# Patient Record
Sex: Female | Born: 1965
Health system: Southern US, Community
[De-identification: ages and names within clinical notes are randomized; demographics above are authoritative.]

## PROBLEM LIST (undated history)

## (undated) DIAGNOSIS — G43909 Migraine, unspecified, not intractable, without status migrainosus: Secondary | ICD-10-CM

## (undated) DIAGNOSIS — L732 Hidradenitis suppurativa: Secondary | ICD-10-CM

## (undated) DIAGNOSIS — Z720 Tobacco use: Secondary | ICD-10-CM

## (undated) DIAGNOSIS — C801 Malignant (primary) neoplasm, unspecified: Secondary | ICD-10-CM

## (undated) DIAGNOSIS — N951 Menopausal and female climacteric states: Secondary | ICD-10-CM

## (undated) DIAGNOSIS — F418 Other specified anxiety disorders: Secondary | ICD-10-CM

## (undated) DIAGNOSIS — G5600 Carpal tunnel syndrome, unspecified upper limb: Secondary | ICD-10-CM

## (undated) DIAGNOSIS — R5383 Other fatigue: Secondary | ICD-10-CM

## (undated) DIAGNOSIS — B019 Varicella without complication: Secondary | ICD-10-CM

## (undated) DIAGNOSIS — R8789 Other abnormal findings in specimens from female genital organs: Secondary | ICD-10-CM

## (undated) DIAGNOSIS — L309 Dermatitis, unspecified: Secondary | ICD-10-CM

## (undated) DIAGNOSIS — E785 Hyperlipidemia, unspecified: Secondary | ICD-10-CM

## (undated) DIAGNOSIS — T7840XA Allergy, unspecified, initial encounter: Secondary | ICD-10-CM

## (undated) DIAGNOSIS — B269 Mumps without complication: Secondary | ICD-10-CM

## (undated) DIAGNOSIS — E559 Vitamin D deficiency, unspecified: Secondary | ICD-10-CM

## (undated) DIAGNOSIS — B059 Measles without complication: Secondary | ICD-10-CM

## (undated) HISTORY — DX: Allergy, unspecified, initial encounter: T78.40XA

## (undated) HISTORY — DX: Migraine, unspecified, not intractable, without status migrainosus: G43.909

## (undated) HISTORY — DX: Varicella without complication: B01.9

## (undated) HISTORY — DX: Tobacco use: Z72.0

## (undated) HISTORY — DX: Dermatitis, unspecified: L30.9

## (undated) HISTORY — DX: Carpal tunnel syndrome, unspecified upper limb: G56.00

## (undated) HISTORY — DX: Other specified anxiety disorders: F41.8

## (undated) HISTORY — DX: Measles without complication: B05.9

## (undated) HISTORY — DX: Menopausal and female climacteric states: N95.1

## (undated) HISTORY — DX: Hyperlipidemia, unspecified: E78.5

## (undated) HISTORY — DX: Malignant (primary) neoplasm, unspecified: C80.1

## (undated) HISTORY — DX: Other abnormal findings in specimens from female genital organs: R87.89

## (undated) HISTORY — DX: Mumps without complication: B26.9

## (undated) HISTORY — DX: Vitamin D deficiency, unspecified: E55.9

## (undated) HISTORY — DX: Hidradenitis suppurativa: L73.2

## (undated) HISTORY — DX: Other fatigue: R53.83

---

## 2002-04-05 DIAGNOSIS — R8789 Other abnormal findings in specimens from female genital organs: Secondary | ICD-10-CM

## 2002-04-05 HISTORY — DX: Other abnormal findings in specimens from female genital organs: R87.89

## 2002-04-05 HISTORY — PX: LEEP: SHX91

## 2005-07-15 ENCOUNTER — Encounter: Admission: RE | Admit: 2005-07-15 | Discharge: 2005-07-15 | Payer: Self-pay | Admitting: Obstetrics and Gynecology

## 2005-08-09 ENCOUNTER — Encounter: Admission: RE | Admit: 2005-08-09 | Discharge: 2005-08-09 | Payer: Self-pay | Admitting: Obstetrics and Gynecology

## 2011-11-09 ENCOUNTER — Ambulatory Visit (INDEPENDENT_AMBULATORY_CARE_PROVIDER_SITE_OTHER): Payer: BC Managed Care – PPO | Admitting: Family Medicine

## 2011-11-09 ENCOUNTER — Encounter: Payer: Self-pay | Admitting: Family Medicine

## 2011-11-09 VITALS — BP 109/68 | HR 71 | Temp 97.5°F | Ht 63.75 in | Wt 135.8 lb

## 2011-11-09 DIAGNOSIS — Z0000 Encounter for general adult medical examination without abnormal findings: Secondary | ICD-10-CM

## 2011-11-09 DIAGNOSIS — G43909 Migraine, unspecified, not intractable, without status migrainosus: Secondary | ICD-10-CM | POA: Insufficient documentation

## 2011-11-09 DIAGNOSIS — F418 Other specified anxiety disorders: Secondary | ICD-10-CM | POA: Insufficient documentation

## 2011-11-09 DIAGNOSIS — R110 Nausea: Secondary | ICD-10-CM

## 2011-11-09 DIAGNOSIS — Z716 Tobacco abuse counseling: Secondary | ICD-10-CM | POA: Insufficient documentation

## 2011-11-09 DIAGNOSIS — Z23 Encounter for immunization: Secondary | ICD-10-CM

## 2011-11-09 DIAGNOSIS — E785 Hyperlipidemia, unspecified: Secondary | ICD-10-CM | POA: Insufficient documentation

## 2011-11-09 DIAGNOSIS — T7840XA Allergy, unspecified, initial encounter: Secondary | ICD-10-CM

## 2011-11-09 DIAGNOSIS — F419 Anxiety disorder, unspecified: Secondary | ICD-10-CM | POA: Insufficient documentation

## 2011-11-09 DIAGNOSIS — R490 Dysphonia: Secondary | ICD-10-CM

## 2011-11-09 DIAGNOSIS — F341 Dysthymic disorder: Secondary | ICD-10-CM

## 2011-11-09 DIAGNOSIS — R5383 Other fatigue: Secondary | ICD-10-CM

## 2011-11-09 DIAGNOSIS — L259 Unspecified contact dermatitis, unspecified cause: Secondary | ICD-10-CM

## 2011-11-09 DIAGNOSIS — E860 Dehydration: Secondary | ICD-10-CM

## 2011-11-09 DIAGNOSIS — G5600 Carpal tunnel syndrome, unspecified upper limb: Secondary | ICD-10-CM

## 2011-11-09 DIAGNOSIS — L309 Dermatitis, unspecified: Secondary | ICD-10-CM

## 2011-11-09 DIAGNOSIS — F17200 Nicotine dependence, unspecified, uncomplicated: Secondary | ICD-10-CM

## 2011-11-09 DIAGNOSIS — Z720 Tobacco use: Secondary | ICD-10-CM

## 2011-11-09 DIAGNOSIS — R5381 Other malaise: Secondary | ICD-10-CM

## 2011-11-09 HISTORY — DX: Dermatitis, unspecified: L30.9

## 2011-11-09 HISTORY — DX: Migraine, unspecified, not intractable, without status migrainosus: G43.909

## 2011-11-09 HISTORY — DX: Other fatigue: R53.83

## 2011-11-09 HISTORY — DX: Tobacco use: Z72.0

## 2011-11-09 MED ORDER — TETANUS-DIPHTH-ACELL PERTUSSIS 5-2.5-18.5 LF-MCG/0.5 IM SUSP: 0.5000 mL | Freq: Once | INTRAMUSCULAR | Status: DC

## 2011-11-09 MED ORDER — PROMETHAZINE HCL 25 MG PO TABS: 25.0000 mg | ORAL_TABLET | Freq: Three times a day (TID) | ORAL | Status: DC | PRN

## 2011-11-09 MED ORDER — BUTALBITAL-ACETAMINOPHEN 50-650 MG PO TABS: 1.0000 | ORAL_TABLET | Freq: Two times a day (BID) | ORAL | Status: DC | PRN

## 2011-11-09 MED ORDER — TRIAMCINOLONE ACETONIDE 0.1 % EX CREA: TOPICAL_CREAM | Freq: Two times a day (BID) | CUTANEOUS | Status: DC | PRN

## 2011-11-09 NOTE — Assessment & Plan Note (Addendum)
Dry mucus membranes with significant coffee intake (up to 10 cups a day) and little water intake, is encouraged to at least switch to 1/2 decaf and up her water and clear fluid intake to compensate

## 2011-11-09 NOTE — Assessment & Plan Note (Signed)
Check an FLP, request old records and start a Mega[employer] krill oil cap daily

## 2011-11-09 NOTE — Patient Instructions (Addendum)
Preventive Care for Adults, Female A healthy lifestyle and preventive care can promote health and wellness. Preventive health guidelines for women include the following key practices.  A routine yearly physical is a good way to check with your caregiver about your health and preventive screening. It is a chance to share any concerns and updates on your health, and to receive a thorough exam.   Visit your dentist for a routine exam and preventive care every 6 months. Brush your teeth twice a day and floss once a day. Good oral hygiene prevents tooth decay and gum disease.   The frequency of eye exams is based on your age, health, family medical history, use of contact lenses, and other factors. Follow your caregiver's recommendations for frequency of eye exams.   Eat a healthy diet. Foods like vegetables, fruits, whole grains, low-fat dairy products, and lean protein foods contain the nutrients you need without too many calories. Decrease your intake of foods high in solid fats, added sugars, and salt. Eat the right amount of calories for you.Get information about a proper diet from your caregiver, if necessary.   Regular physical exercise is one of the most important things you can do for your health. Most adults should get at least 150 minutes of moderate-intensity exercise (any activity that increases your heart rate and causes you to sweat) each week. In addition, most adults need muscle-strengthening exercises on 2 or more days a week.   Maintain a healthy weight. The body mass index (BMI) is a screening tool to identify possible weight problems. It provides an estimate of body fat based on height and weight. Your caregiver can help determine your BMI, and can help you achieve or maintain a healthy weight.For adults 20 years and older:   A BMI below 18.5 is considered underweight.   A BMI of 18.5 to 24.9 is normal.   A BMI of 25 to 29.9 is considered overweight.   A BMI of 30 and above is  considered obese.   Maintain normal blood lipids and cholesterol levels by exercising and minimizing your intake of saturated fat. Eat a balanced diet with plenty of fruit and vegetables. Blood tests for lipids and cholesterol should begin at age 20 and be repeated every 5 years. If your lipid or cholesterol levels are high, you are over 50, or you are at high risk for heart disease, you may need your cholesterol levels checked more frequently.Ongoing high lipid and cholesterol levels should be treated with medicines if diet and exercise are not effective.   If you smoke, find out from your caregiver how to quit. If you do not use tobacco, do not start.   If you are pregnant, do not drink alcohol. If you are breastfeeding, be very cautious about drinking alcohol. If you are not pregnant and choose to drink alcohol, do not exceed 1 drink per day. One drink is considered to be 12 ounces (355 mL) of beer, 5 ounces (148 mL) of wine, or 1.5 ounces (44 mL) of liquor.   Avoid use of street drugs. Do not share needles with anyone. Ask for help if you need support or instructions about stopping the use of drugs.   High blood pressure causes heart disease and increases the risk of stroke. Your blood pressure should be checked at least every 1 to 2 years. Ongoing high blood pressure should be treated with medicines if weight loss and exercise are not effective.   If you are 55 to 48   years old, ask your caregiver if you should take aspirin to prevent strokes.   Diabetes screening involves taking a blood sample to check your fasting blood sugar level. This should be done once every 3 years, after age 45, if you are within normal weight and without risk factors for diabetes. Testing should be considered at a younger age or be carried out more frequently if you are overweight and have at least 1 risk factor for diabetes.   Breast cancer screening is essential preventive care for women. You should practice "breast  self-awareness." This means understanding the normal appearance and feel of your breasts and may include breast self-examination. Any changes detected, no matter how small, should be reported to a caregiver. Women in their 20s and 30s should have a clinical breast exam (CBE) by a caregiver as part of a regular health exam every 1 to 3 years. After age 40, women should have a CBE every year. Starting at age 40, women should consider having a mammography (breast X-ray test) every year. Women who have a family history of breast cancer should talk to their caregiver about genetic screening. Women at a high risk of breast cancer should talk to their caregivers about having magnetic resonance imaging (MRI) and a mammography every year.   The Pap test is a screening test for cervical cancer. A Pap test can show cell changes on the cervix that might become cervical cancer if left untreated. A Pap test is a procedure in which cells are obtained and examined from the lower end of the uterus (cervix).   Women should have a Pap test starting at age 21.   Between ages 21 and 29, Pap tests should be repeated every 2 years.   Beginning at age 30, you should have a Pap test every 3 years as long as the past 3 Pap tests have been normal.   Some women have medical problems that increase the chance of getting cervical cancer. Talk to your caregiver about these problems. It is especially important to talk to your caregiver if a new problem develops soon after your last Pap test. In these cases, your caregiver may recommend more frequent screening and Pap tests.   The above recommendations are the same for women who have or have not gotten the vaccine for human papillomavirus (HPV).   If you had a hysterectomy for a problem that was not cancer or a condition that could lead to cancer, then you no longer need Pap tests. Even if you no longer need a Pap test, a regular exam is a good idea to make sure no other problems are  starting.   If you are between ages 65 and 70, and you have had normal Pap tests going back 10 years, you no longer need Pap tests. Even if you no longer need a Pap test, a regular exam is a good idea to make sure no other problems are starting.   If you have had past treatment for cervical cancer or a condition that could lead to cancer, you need Pap tests and screening for cancer for at least 20 years after your treatment.   If Pap tests have been discontinued, risk factors (such as a new sexual partner) need to be reassessed to determine if screening should be resumed.   The HPV test is an additional test that may be used for cervical cancer screening. The HPV test looks for the virus that can cause the cell changes on the cervix.   The cells collected during the Pap test can be tested for HPV. The HPV test could be used to screen women aged 30 years and older, and should be used in women of any age who have unclear Pap test results. After the age of 30, women should have HPV testing at the same frequency as a Pap test.   Colorectal cancer can be detected and often prevented. Most routine colorectal cancer screening begins at the age of 50 and continues through age 75. However, your caregiver may recommend screening at an earlier age if you have risk factors for colon cancer. On a yearly basis, your caregiver may provide home test kits to check for hidden blood in the stool. Use of a small camera at the end of a tube, to directly examine the colon (sigmoidoscopy or colonoscopy), can detect the earliest forms of colorectal cancer. Talk to your caregiver about this at age 50, when routine screening begins. Direct examination of the colon should be repeated every 5 to 10 years through age 75, unless early forms of pre-cancerous polyps or small growths are found.   Hepatitis C blood testing is recommended for all people born from 1945 through 1965 and any individual with known risks for hepatitis C.    Practice safe sex. Use condoms and avoid high-risk sexual practices to reduce the spread of sexually transmitted infections (STIs). STIs include gonorrhea, chlamydia, syphilis, trichomonas, herpes, HPV, and human immunodeficiency virus (HIV). Herpes, HIV, and HPV are viral illnesses that have no cure. They can result in disability, cancer, and death. Sexually active women aged 25 and younger should be checked for chlamydia. Older women with new or multiple partners should also be tested for chlamydia. Testing for other STIs is recommended if you are sexually active and at increased risk.   Osteoporosis is a disease in which the bones lose minerals and strength with aging. This can result in serious bone fractures. The risk of osteoporosis can be identified using a bone density scan. Women ages 65 and over and women at risk for fractures or osteoporosis should discuss screening with their caregivers. Ask your caregiver whether you should take a calcium supplement or vitamin D to reduce the rate of osteoporosis.   Menopause can be associated with physical symptoms and risks. Hormone replacement therapy is available to decrease symptoms and risks. You should talk to your caregiver about whether hormone replacement therapy is right for you.   Use sunscreen with sun protection factor (SPF) of 30 or more. Apply sunscreen liberally and repeatedly throughout the day. You should seek shade when your shadow is shorter than you. Protect yourself by wearing long sleeves, pants, a wide-brimmed hat, and sunglasses year round, whenever you are outdoors.   Once a month, do a whole body skin exam, using a mirror to look at the skin on your back. Notify your caregiver of new moles, moles that have irregular borders, moles that are larger than a pencil eraser, or moles that have changed in shape or color.   Stay current with required immunizations.   Influenza. You need a dose every fall (or winter). The composition of  the flu vaccine changes each year, so being vaccinated once is not enough.   Pneumococcal polysaccharide. You need 1 to 2 doses if you smoke cigarettes or if you have certain chronic medical conditions. You need 1 dose at age 65 (or older) if you have never been vaccinated.   Tetanus, diphtheria, pertussis (Tdap, Td). Get 1 dose of   Tdap vaccine if you are younger than age 65, are over 65 and have contact with an infant, are a healthcare worker, are pregnant, or simply want to be protected from whooping cough. After that, you need a Td booster dose every 10 years. Consult your caregiver if you have not had at least 3 tetanus and diphtheria-containing shots sometime in your life or have a deep or dirty wound.   HPV. You need this vaccine if you are a woman age 26 or younger. The vaccine is given in 3 doses over 6 months.   Measles, mumps, rubella (MMR). You need at least 1 dose of MMR if you were born in 1957 or later. You may also need a second dose.   Meningococcal. If you are age 19 to 21 and a first-year college student living in a residence hall, or have one of several medical conditions, you need to get vaccinated against meningococcal disease. You may also need additional booster doses.   Zoster (shingles). If you are age 60 or older, you should get this vaccine.   Varicella (chickenpox). If you have never had chickenpox or you were vaccinated but received only 1 dose, talk to your caregiver to find out if you need this vaccine.   Hepatitis A. You need this vaccine if you have a specific risk factor for hepatitis A virus infection or you simply wish to be protected from this disease. The vaccine is usually given as 2 doses, 6 to 18 months apart.   Hepatitis B. You need this vaccine if you have a specific risk factor for hepatitis B virus infection or you simply wish to be protected from this disease. The vaccine is given in 3 doses, usually over 6 months.  Preventive Services /  Frequency Ages 19 to 39  Blood pressure check.** / Every 1 to 2 years.   Lipid and cholesterol check.** / Every 5 years beginning at age 20.   Clinical breast exam.** / Every 3 years for women in their 20s and 30s.   Pap test.** / Every 2 years from ages 21 through 29. Every 3 years starting at age 30 through age 65 or 70 with a history of 3 consecutive normal Pap tests.   HPV screening.** / Every 3 years from ages 30 through ages 65 to 70 with a history of 3 consecutive normal Pap tests.   Hepatitis C blood test.** / For any individual with known risks for hepatitis C.   Skin self-exam. / Monthly.   Influenza immunization.** / Every year.   Pneumococcal polysaccharide immunization.** / 1 to 2 doses if you smoke cigarettes or if you have certain chronic medical conditions.   Tetanus, diphtheria, pertussis (Tdap, Td) immunization. / A one-time dose of Tdap vaccine. After that, you need a Td booster dose every 10 years.   HPV immunization. / 3 doses over 6 months, if you are 26 and younger.   Measles, mumps, rubella (MMR) immunization. / You need at least 1 dose of MMR if you were born in 1957 or later. You may also need a second dose.   Meningococcal immunization. / 1 dose if you are age 19 to 21 and a first-year college student living in a residence hall, or have one of several medical conditions, you need to get vaccinated against meningococcal disease. You may also need additional booster doses.   Varicella immunization.** / Consult your caregiver.   Hepatitis A immunization.** / Consult your caregiver. 2 doses, 6 to 18 months   apart.   Hepatitis B immunization.** / Consult your caregiver. 3 doses usually over 6 months.  Ages 40 to 64  Blood pressure check.** / Every 1 to 2 years.   Lipid and cholesterol check.** / Every 5 years beginning at age 20.   Clinical breast exam.** / Every year after age 40.   Mammogram.** / Every year beginning at age 40 and continuing for as  long as you are in good health. Consult with your caregiver.   Pap test.** / Every 3 years starting at age 30 through age 65 or 70 with a history of 3 consecutive normal Pap tests.   HPV screening.** / Every 3 years from ages 30 through ages 65 to 70 with a history of 3 consecutive normal Pap tests.   Fecal occult blood test (FOBT) of stool. / Every year beginning at age 50 and continuing until age 75. You may not need to do this test if you get a colonoscopy every 10 years.   Flexible sigmoidoscopy or colonoscopy.** / Every 5 years for a flexible sigmoidoscopy or every 10 years for a colonoscopy beginning at age 50 and continuing until age 75.   Hepatitis C blood test.** / For all people born from 1945 through 1965 and any individual with known risks for hepatitis C.   Skin self-exam. / Monthly.   Influenza immunization.** / Every year.   Pneumococcal polysaccharide immunization.** / 1 to 2 doses if you smoke cigarettes or if you have certain chronic medical conditions.   Tetanus, diphtheria, pertussis (Tdap, Td) immunization.** / A one-time dose of Tdap vaccine. After that, you need a Td booster dose every 10 years.   Measles, mumps, rubella (MMR) immunization. / You need at least 1 dose of MMR if you were born in 1957 or later. You may also need a second dose.   Varicella immunization.** / Consult your caregiver.   Meningococcal immunization.** / Consult your caregiver.   Hepatitis A immunization.** / Consult your caregiver. 2 doses, 6 to 18 months apart.   Hepatitis B immunization.** / Consult your caregiver. 3 doses, usually over 6 months.  Ages 65 and over  Blood pressure check.** / Every 1 to 2 years.   Lipid and cholesterol check.** / Every 5 years beginning at age 20.   Clinical breast exam.** / Every year after age 40.   Mammogram.** / Every year beginning at age 40 and continuing for as long as you are in good health. Consult with your caregiver.   Pap test.** /  Every 3 years starting at age 30 through age 65 or 70 with a 3 consecutive normal Pap tests. Testing can be stopped between 65 and 70 with 3 consecutive normal Pap tests and no abnormal Pap or HPV tests in the past 10 years.   HPV screening.** / Every 3 years from ages 30 through ages 65 or 70 with a history of 3 consecutive normal Pap tests. Testing can be stopped between 65 and 70 with 3 consecutive normal Pap tests and no abnormal Pap or HPV tests in the past 10 years.   Fecal occult blood test (FOBT) of stool. / Every year beginning at age 50 and continuing until age 75. You may not need to do this test if you get a colonoscopy every 10 years.   Flexible sigmoidoscopy or colonoscopy.** / Every 5 years for a flexible sigmoidoscopy or every 10 years for a colonoscopy beginning at age 50 and continuing until age 75.   Hepatitis   C blood test.** / For all people born from [birth year] through 1965 and any individual with known risks for hepatitis C.   Osteoporosis screening.** / A one-time screening for women ages 71 and over and women at risk for fractures or osteoporosis.   Skin self-exam. / Monthly.   Influenza immunization.** / Every year.   Pneumococcal polysaccharide immunization.** / 1 dose at age 56 (or older) if you have never been vaccinated.   Tetanus, diphtheria, pertussis (Tdap, Td) immunization. / A one-time dose of Tdap vaccine if you are over 65 and have contact with an infant, are a Research scientist (physical sciences), or simply want to be protected from whooping cough. After that, you need a Td booster dose every 10 years.   Varicella immunization.** / Consult your caregiver.   Meningococcal immunization.** / Consult your caregiver.   Hepatitis A immunization.** / Consult your caregiver. 2 doses, 6 to 18 months apart.   Hepatitis B immunization.** / Check with your caregiver. 3 doses, usually over 6 months.  ** Family history and personal history of risk and conditions may change your caregiver's  recommendations. Document Released: 05/18/2001 Document Revised: 03/11/2011 Document Reviewed: 08/17/2010 [facility] Patient Information 2012 [place], [place].  Migraine Headache A migraine headache is an intense, throbbing pain on one or both sides of your head. The exact cause of a migraine headache is not always known. A migraine may be caused when nerves in the brain become irritated and release chemicals that cause swelling within blood vessels, causing pain. Many migraine sufferers have a family history of migraines. Before you get a migraine you may or may not get an aura. An aura is a group of symptoms that can predict the beginning of a migraine. An aura may include:  Visual changes such as:   Flashing lights.   Bright spots or zig-zag lines.   Tunnel vision.   Feelings of numbness.   Trouble talking.   Muscle weakness.  SYMPTOMS  Pain on one or both sides of your head.   Pain that is pulsating or throbbing in nature.   Pain that is severe enough to prevent daily activities.   Pain that is aggravated by any daily physical activity.   Nausea (feeling sick to your stomach), vomiting, or both.   Pain with exposure to bright lights, loud noises, or activity.   General sensitivity to bright lights or loud noises.  MIGRAINE TRIGGERS Examples of triggers of migraine headaches include:   Alcohol.   Smoking.   Stress.   It may be related to menses (female menstruation).   Aged cheeses.   Foods or drinks that contain nitrates, glutamate, aspartame, or tyramine.   Lack of sleep.   Chocolate.   Caffeine.   Hunger.   Medications such as nitroglycerine (used to treat chest pain), birth control pills, estrogen, and some blood pressure medications.  DIAGNOSIS  A migraine headache is often diagnosed based on:  Symptoms.   Physical examination.   A computerized X-ray scan (computed tomography, CT) of your head.  TREATMENT  Medications can help prevent  migraines if they are recurrent or should they become recurrent. Your caregiver can help you with a medication or treatment program that will be helpful to you.   Lying down in a dark, quiet room may be helpful.   Keeping a headache diary may help you find a trend as to what may be triggering your headaches.  SEEK IMMEDIATE MEDICAL CARE IF:   You have confusion, personality changes or seizures.  You have headaches that wake you from sleep.   You have an increased frequency in your headaches.   You have a stiff neck.   You have a loss of vision.   You have muscle weakness.   You start losing your balance or have trouble walking.   You feel faint or pass out.  MAKE SURE YOU:   Understand these instructions.   Will watch your condition.   Will get help right away if you are not doing well or get worse.  Document Released: 03/22/2005 Document Revised: 03/11/2011 Document Reviewed: 11/05/2008 [facility] Patient Information 2012 [place], [place].  So initially for HA take Ibuprofen 200 mg tabs 2 of them or an Excedrine Migraine. Then if nausea develops or you just feel it is a bad HA and you can afford to go to sleep take the Promethazine/Phenergan and lie down. The final med is Bupap and it can be taken alone or in combination with any other meds for any type of HA. Most people have no sedation with the Bupap

## 2011-11-09 NOTE — Assessment & Plan Note (Signed)
Takes zyrtec daily and denies any significant congestion or sneezing but does note the recent development of hoarseness, without cough, dysphagia or illness. Likely multifactorial, encouraged better hydration, stop smoking and if hoarseness persists will need to see ENT

## 2011-11-09 NOTE — Assessment & Plan Note (Signed)
Has been getting migraines intermittently for for several years now. Are decreasing in frequency and are likely related to perimenopause. She uses ibuprofen alternately with Excedrin Migraine he gets some Aleve although with a bad headache will be in bed all day. Has never taken prescription medications. Is given a small amount of Bupap and promethazine. Is instructed to start with ibuprofen or Excedrin Migraine if no relief and nausea develops try the promethazine as long she can afford to go to sleep. If nothing is helpful or she cannot afford to go to sleep she may try the Bupap. Instructed regarding the need for adequate hydration and small frequent meals as well

## 2011-11-09 NOTE — Assessment & Plan Note (Addendum)
Has failed conservative management with Voltaren gel, splinting. Recent EMG studies with her neurologist Dr Loleta Chance shows persistent deficits, she is being referred to orthopaedics for surgical correction. Has had symptoms in the past on the left but these did respond to conservative management

## 2011-11-09 NOTE — Progress Notes (Signed)
Patient ID: Michelle Arnold, female   DOB: Aug 27, 1965, 46 y.o.   MRN: [id number] Michelle Arnold [id number] 14-Feb-1966 11/09/2011      Progress Note New Patient  Subjective  Chief Complaint  Chief Complaint  Patient presents with  . Establish Care    new patient    HPI  This is a 46 year old Caucasian female who is in today to establish care. She, acute past medical history but is doing relatively well at this time. She has a long history of depression anxiety has been on and off medications for years but reports her current dose of Effexor is working relatively well. 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 She also has 2 grown children and 2 stepchildren at home and so is under a great deal of stress. Also has 2 other stepchildren no longer living. She is a smoker who continues to smoke a pack a day and realizes she needs to quit but has difficulty getting ready to do so. Has failed Wellbutrin in the past. She follows with Dr. Loleta Chance of neurology and has recently been told that her right hand carpal tunnel syndrome requires surgery. She notes she has a recent history of hoarseness developing maybe the last month or so. No dysphasia, cough, recent illness or noted. She has a history of allergies and takes Zyrtec every day but has not had recent congestion or sneezing. She's had migraine headaches off and on for years presently she is getting only a couple months and they're decreasing in frequency she's never taken a prescription medication for this. Ibuprofen and Excedrin Migraine help somewhat. The headaches occasionally get out of the cause nausea vomiting and she has trouble keeping medications down. She has a rash on her right wrist at about a month it is itchy and raised secondary to use of Voltaren gel for her carpal tunnel syndrome. She's not had any medications thus far to try and manage it she is to stop the Voltaren gel.  Past Medical  History  Diagnosis Date  . Chicken pox as a child  . Measles as a child  . Mumps as a child  . Hyperlipidemia   . Depression   . Anxiety   . CTS (carpal tunnel syndrome)     right  . Allergy   . Migraine 11/09/2011  . Tobacco abuse 11/09/2011  . Fatigue 11/09/2011  . Preventative health care 11/09/2011  . Dehydration 11/09/2011  . Depression with anxiety   . Dermatitis 11/09/2011    Right arm  . Hoarseness 11/09/2011    Past Surgical History  Procedure Date  . Leep 2004    Family History  Problem Relation Age of Onset  . Diabetes Mother     ?  Marland Kitchen Osteoporosis Mother   . Hyperlipidemia Father   . Heart disease Father     MI in 56  . Hypertension Father   . Heart attack Father 40  . Cancer Maternal Grandmother     lung- smoker  . Osteoporosis Maternal Grandmother   . Cancer Maternal Grandfather     prostate  . Cataracts Paternal Grandmother   . Other Paternal Grandfather     PAD with gangrene    History   Social History  . Marital Status: Married    Spouse Name: N/A    Number of Children: N/A  . Years of Education: N/A   Occupational History  . Not on  file.   Social History Main Topics  . Smoking status: Current Everyday Smoker -- 1.0 packs/day for 30 years    Types: Cigarettes  . Smokeless tobacco: Never Used  . Alcohol Use: Yes     very rare  . Drug Use: No  . Sexually Active: Yes -- Female partner(s)   Other Topics Concern  . Not on file   Social History Narrative  . No narrative on file    Current Outpatient Prescriptions on File Prior to Visit  Medication Sig Dispense Refill  . Calcium Citrate (CITRACAL PO) Take 2 tablets by mouth daily.      . cetirizine (ZYRTEC) 10 MG tablet Take 10 mg by mouth daily.      . promethazine (PHENERGAN) 25 MG tablet Take 1 tablet (25 mg total) by mouth every 8 (eight) hours as needed for nausea (causes sedation).  20 tablet  0  . rosuvastatin (CRESTOR) 5 MG tablet Take 5 mg by mouth daily.      Marland Kitchen venlafaxine (EFFEXOR)  75 MG tablet Take 75 mg by mouth daily.       No current facility-administered medications on file prior to visit.    No Known Allergies  Review of Systems  Review of Systems  Constitutional: Positive for malaise/fatigue. Negative for fever and chills.  HENT: Negative for hearing loss, nosebleeds and congestion.   Eyes: Negative for discharge.  Respiratory: Negative for cough, sputum production, shortness of breath and wheezing.   Cardiovascular: Negative for chest pain, palpitations and leg swelling.  Gastrointestinal: Negative for heartburn, nausea, vomiting, abdominal pain, diarrhea, constipation and blood in stool.  Genitourinary: Negative for dysuria, urgency, frequency and hematuria.  Musculoskeletal: Negative for myalgias, back pain and falls.  Skin: Positive for itching and rash.       Right wrist x 1 months  Neurological: Negative for dizziness, tremors, sensory change, focal weakness, loss of consciousness, weakness and headaches.  Endo/Heme/Allergies: Negative for polydipsia. Does not bruise/bleed easily.  Psychiatric/Behavioral: Negative for depression and suicidal ideas. The patient is nervous/anxious. The patient does not have insomnia.     Objective  BP 109/68  Pulse 71  Temp 97.5 F (36.4 C) (Temporal)  Ht 5' 3.75" (1.619 m)  Wt 135 lb 12.8 oz (61.598 kg)  BMI 23.49 kg/m2  SpO2 94%  Physical Exam  Physical Exam  Constitutional: She is oriented to person, place, and time and well-developed, well-nourished, and in no distress. No distress.  HENT:  Head: Normocephalic and atraumatic.  Right Ear: External ear normal.  Left Ear: External ear normal.  Nose: Nose normal.  Mouth/Throat: Oropharynx is clear and moist. No oropharyngeal exudate.  Eyes: Conjunctivae are normal. Pupils are equal, round, and reactive to light. Right eye exhibits no discharge. Left eye exhibits no discharge. No scleral icterus.  Neck: Normal range of motion. Neck supple. No thyromegaly  present.  Cardiovascular: Normal rate, regular rhythm, normal heart sounds and intact distal pulses.   No murmur heard. Pulmonary/Chest: Effort normal. No respiratory distress. She has no wheezes. She has no rales.       Prolonged expiratory phase  Abdominal: Soft. Bowel sounds are normal. She exhibits no distension and no mass. There is no tenderness.  Musculoskeletal: Normal range of motion. She exhibits no edema and no tenderness.  Lymphadenopathy:    She has no cervical adenopathy.  Neurological: She is alert and oriented to person, place, and time. She has normal reflexes. No cranial nerve deficit. Coordination normal.  Skin: Skin is  warm and dry. Rash noted. She is not diaphoretic. There is erythema.       Right wrist erythematous maculopapular lesions noted  Psychiatric: Mood, memory and affect normal.       Assessment & Plan  Preventative health care Given Tdap today. Old records requested from previous PMD. Encouraged DASH diet and increase exercise.  Migraine Has been getting migraines intermittently for for several years now. Are decreasing in frequency and are likely related to perimenopause. She uses ibuprofen alternately with Excedrin Migraine he gets some Aleve although with a bad headache will be in bed all day. Has never taken prescription medications. Is given a small amount of Bupap and promethazine. Is instructed to start with ibuprofen or Excedrin Migraine if no relief and nausea develops try the promethazine as long she can afford to go to sleep. If nothing is helpful or she cannot afford to go to sleep she may try the Bupap. Instructed regarding the need for adequate hydration and small frequent meals as well  CTS (carpal tunnel syndrome) Has failed conservative management with Voltaren gel, splinting. Recent EMG studies with her neurologist Dr Loleta Chance shows persistent deficits, she is being referred to orthopaedics for surgical correction. Has had symptoms in the past  on the left but these did respond to conservative management  Hyperlipidemia Check an FLP, request old records and start a MegaRed krill oil cap daily   Allergic state Takes zyrtec daily and denies any significant congestion or sneezing but does note the recent development of hoarseness, without cough, dysphagia or illness. Likely multifactorial, encouraged better hydration, stop smoking and if hoarseness persists will need to see ENT  Hoarseness Takes zyrtec daily and denies any significant congestion or sneezing but does note the recent development of hoarseness, without cough, dysphagia or illness. Likely multifactorial, encouraged better hydration, stop smoking and if hoarseness persists will need to see ENT  Fatigue Multifactorial, recommend labs, quit smoking and due to poor concentration as well would consider low dose Ritalin as a therapy if needed  Dermatitis Secondary to Voltaren, will hold the gel and start her on Triamcinolone bid as needed  Dehydration Dry mucus membranes with significant coffee intake (up to 10 cups a day) and little water intake, is encouraged to at least switch to 1/2 decaf and up her water and clear fluid intake to compensate  Tobacco abuse Smoking a ppd, educated at length regarding the need to quit, she does have an ecig. Is encouraged to wait 10 to 15 minutes every time she has a craving and then to use the ecig instead of a cig. Reassess at next visit  Depression with anxiety Is stable on Effexor 75mg , has tried to come off in past and does not do well. She will maintain this dose for now

## 2011-11-09 NOTE — Assessment & Plan Note (Signed)
Secondary to Voltaren, will hold the gel and start her on Triamcinolone bid as needed

## 2011-11-09 NOTE — Assessment & Plan Note (Signed)
Given Tdap today. Old records requested from previous PMD. Encouraged DASH diet and increase exercise.

## 2011-11-09 NOTE — Assessment & Plan Note (Signed)
Multifactorial, recommend labs, quit smoking and due to poor concentration as well would consider low dose Ritalin as a therapy if needed

## 2011-11-09 NOTE — Assessment & Plan Note (Signed)
Takes zyrtec daily and denies any significant congestion or sneezing but does note the recent development of hoarseness, without cough, dysphagia or illness. Likely multifactorial, encouraged better hydration, stop smoking and if hoarseness persists will need to see ENT 

## 2011-11-09 NOTE — Assessment & Plan Note (Signed)
Smoking a ppd, educated at length regarding the need to quit, she does have an ecig. Is encouraged to wait 10 to 15 minutes every time she has a craving and then to use the ecig instead of a cig. Reassess at next visit

## 2011-11-09 NOTE — Assessment & Plan Note (Addendum)
Is stable on Effexor 75mg , has tried to come off in past and does not do well. She will maintain this dose for now

## 2011-12-02 ENCOUNTER — Other Ambulatory Visit: Payer: Self-pay | Admitting: Family Medicine

## 2011-12-02 ENCOUNTER — Telehealth: Payer: Self-pay | Admitting: Family Medicine

## 2011-12-02 DIAGNOSIS — G5600 Carpal tunnel syndrome, unspecified upper limb: Secondary | ICD-10-CM

## 2011-12-02 NOTE — Telephone Encounter (Signed)
So I was under the impression that the neurologist was handling the referral since they had done the EMG and work up. I am happy to help with the referral now that I know she wants me to do it but I do not have the results of the EMG so she will have to get them and take them to the neurologist

## 2011-12-02 NOTE — Telephone Encounter (Signed)
LM for patient to call me back. I was able to get a chart started at the [facility] [place]. They said we can fax [phone] attn: Wilkie Aye the referral and the EMG studies from Dr [facility] office when we receive them. No appt can be made until they receive the EMG studies. I have faxed the medical record release form to Dr [facility] office.

## 2011-12-02 NOTE — Telephone Encounter (Signed)
So she actually knew we were not doing the referral her neurologist was supposed to do it and did not I can facilitate this and we will try and get the EMG studies through a records release. If we R unsuccessful she will have to go up there and request the study

## 2011-12-02 NOTE — Telephone Encounter (Signed)
Patient states she has been waiting for a referral to an orthopedist since her last OV (11/09/11) with Dr Loleta Chance who is her neurologist. She has contacted [facility] [place] [phone] & they state they have not received a referral. Patient would like to know if we can set up her referral for her hand. She states the carpal tunnel symptoms are waking her up at night. She also needs to take care of this since her coworker will be out later in the year & she needs to keep the office covered.

## 2011-12-10 ENCOUNTER — Other Ambulatory Visit (INDEPENDENT_AMBULATORY_CARE_PROVIDER_SITE_OTHER): Payer: BC Managed Care – PPO

## 2011-12-10 DIAGNOSIS — Z79899 Other long term (current) drug therapy: Secondary | ICD-10-CM

## 2011-12-10 DIAGNOSIS — Z0000 Encounter for general adult medical examination without abnormal findings: Secondary | ICD-10-CM

## 2011-12-10 DIAGNOSIS — R5383 Other fatigue: Secondary | ICD-10-CM

## 2011-12-10 LAB — CBC
Hemoglobin: 13 g/dL (ref 12.0–15.0)
RDW: 13.4 % (ref 11.5–14.6)

## 2011-12-10 LAB — RENAL FUNCTION PANEL
BUN: 17 mg/dL (ref 6–23)
Chloride: 104 mEq/L (ref 96–112)
Creatinine, Ser: 0.6 mg/dL (ref 0.4–1.2)
GFR: 115.53 mL/min (ref >60.00)
Glucose, Bld: 96 mg/dL (ref 70–99)
Potassium: 5.1 mEq/L (ref 3.5–5.1)
Sodium: 140 mEq/L (ref 135–145)

## 2011-12-10 LAB — TSH: TSH: 0.31 u[IU]/mL — ABNORMAL LOW (ref 0.35–5.50)

## 2011-12-10 LAB — HEPATIC FUNCTION PANEL
AST: 25 U/L (ref 0–37)
Alkaline Phosphatase: 58 U/L (ref 39–117)
Bilirubin, Direct: 0 mg/dL (ref 0.0–0.3)
Total Bilirubin: 0.5 mg/dL (ref 0.3–1.2)

## 2011-12-10 LAB — LIPID PANEL
Cholesterol: 208 mg/dL — ABNORMAL HIGH (ref 0–200)
Total CHOL/HDL Ratio: 3
VLDL: 21.6 mg/dL (ref 0.0–40.0)

## 2011-12-16 ENCOUNTER — Encounter: Payer: Self-pay | Admitting: Family Medicine

## 2011-12-16 ENCOUNTER — Ambulatory Visit (INDEPENDENT_AMBULATORY_CARE_PROVIDER_SITE_OTHER): Payer: BC Managed Care – PPO | Admitting: Family Medicine

## 2011-12-16 VITALS — BP 114/74 | HR 89 | Temp 98.0°F | Ht 63.75 in | Wt 133.8 lb

## 2011-12-16 DIAGNOSIS — F32A Depression, unspecified: Secondary | ICD-10-CM

## 2011-12-16 DIAGNOSIS — E785 Hyperlipidemia, unspecified: Secondary | ICD-10-CM

## 2011-12-16 DIAGNOSIS — F329 Major depressive disorder, single episode, unspecified: Secondary | ICD-10-CM

## 2011-12-16 DIAGNOSIS — Z720 Tobacco use: Secondary | ICD-10-CM

## 2011-12-16 DIAGNOSIS — L732 Hidradenitis suppurativa: Secondary | ICD-10-CM | POA: Insufficient documentation

## 2011-12-16 DIAGNOSIS — F341 Dysthymic disorder: Secondary | ICD-10-CM

## 2011-12-16 DIAGNOSIS — T7840XA Allergy, unspecified, initial encounter: Secondary | ICD-10-CM

## 2011-12-16 DIAGNOSIS — L738 Other specified follicular disorders: Secondary | ICD-10-CM

## 2011-12-16 DIAGNOSIS — F17200 Nicotine dependence, unspecified, uncomplicated: Secondary | ICD-10-CM

## 2011-12-16 DIAGNOSIS — E079 Disorder of thyroid, unspecified: Secondary | ICD-10-CM

## 2011-12-16 DIAGNOSIS — L739 Follicular disorder, unspecified: Secondary | ICD-10-CM

## 2011-12-16 HISTORY — DX: Hidradenitis suppurativa: L73.2

## 2011-12-16 LAB — RENAL FUNCTION PANEL
Albumin: 4.6 g/dL (ref 3.5–5.2)
CO2: 28 mEq/L (ref 19–32)
Calcium: 10.3 mg/dL (ref 8.4–10.5)
Chloride: 102 mEq/L (ref 96–112)
Glucose, Bld: 93 mg/dL (ref 70–99)
Phosphorus: 4.7 mg/dL — ABNORMAL HIGH (ref 2.3–4.6)
Potassium: 5.3 mEq/L — ABNORMAL HIGH (ref 3.5–5.1)
Sodium: 138 mEq/L (ref 135–145)

## 2011-12-16 MED ORDER — BUPROPION HCL ER (XL) 300 MG PO TB24: 300.0000 mg | ORAL_TABLET | Freq: Every day | ORAL | Status: DC

## 2011-12-16 MED ORDER — ALIGN PO CAPS: 1.0000 | ORAL_CAPSULE | Freq: Every day | ORAL | Status: AC

## 2011-12-16 MED ORDER — BUPROPION HCL ER (XL) 150 MG PO TB24: 150.0000 mg | ORAL_TABLET | Freq: Every day | ORAL | Status: DC

## 2011-12-16 MED ORDER — SULFAMETHOXAZOLE-TRIMETHOPRIM 800-160 MG PO TABS: 1.0000 | ORAL_TABLET | Freq: Two times a day (BID) | ORAL | Status: AC

## 2011-12-16 MED ORDER — MUPIROCIN 2 % EX OINT: TOPICAL_OINTMENT | Freq: Every day | CUTANEOUS | Status: AC

## 2011-12-16 NOTE — Progress Notes (Signed)
Patient ID: Michelle Arnold, female   DOB: Aug 31, 1965, 46 y.o.   MRN: [id number] Michelle Arnold [id number] 02/28/1966 12/16/2011      Progress Note-Follow Up  Subjective  Chief Complaint  Chief Complaint  Patient presents with  . Follow-up     1 month    HPI  Patient is a 46 year old Caucasian female in today for followup. She is struggling with increased congestion. She feels her allergies are clear. She's got cough is productive of gray phlegm as well as some postnasal drip. She's complaining of a follicular lesions in her groin which been present for a while. She's been drained and mechanically. She applied hot compresses frequently and they're very uncomfortable. No fevers or chills. She continues to smoke. Is down about half pack per day with her EDC red. She underwent menopause with her last period at roughly age 46 but is noting some episodes of heat intolerance and hot flashes or she becomes slightly sweaty. She does acknowledge this often occurs when she is needing 4 while. No chest pain, palpitations, shortness of breath, GI or GU complaints noted.  Past Medical History  Diagnosis Date  . Chicken pox as a child  . Measles as a child  . Mumps as a child  . Hyperlipidemia   . Depression   . Anxiety   . CTS (carpal tunnel syndrome)     right  . Allergy   . Migraine 11/09/2011  . Tobacco abuse 11/09/2011  . Fatigue 11/09/2011  . Preventative health care 11/09/2011  . Dehydration 11/09/2011  . Depression with anxiety   . Dermatitis 11/09/2011    Right arm  . Hoarseness 11/09/2011  . Other abnormal Papanicolaou smear of cervix and cervical HPV 2004    required LEEP procedure in 04 but no recurrence  . Folliculitis 12/16/2011    Past Surgical History  Procedure Date  . Leep 2004    Family History  Problem Relation Age of Onset  . Diabetes Mother     ?  Marland Kitchen Osteoporosis Mother   . Hyperlipidemia Father   . Heart disease Father     MI in 30  . Hypertension Father   . Heart attack  Father 80  . Cancer Maternal Grandmother     lung- smoker  . Osteoporosis Maternal Grandmother   . Cancer Maternal Grandfather     prostate  . Cataracts Paternal Grandmother   . Other Paternal Grandfather     PAD with gangrene    History   Social History  . Marital Status: Married    Spouse Name: N/A    Number of Children: N/A  . Years of Education: N/A   Occupational History  . Not on file.   Social History Main Topics  . Smoking status: Current Every Day Smoker -- 1.0 packs/day for 30 years    Types: Cigarettes  . Smokeless tobacco: Never Used  . Alcohol Use: Yes     very rare  . Drug Use: No  . Sexually Active: Yes -- Female partner(s)   Other Topics Concern  . Not on file   Social History Narrative  . No narrative on file    Current Outpatient Prescriptions on File Prior to Visit  Medication Sig Dispense Refill  . B Complex-C (SUPER B COMPLEX/VITAMIN C) TABS Take 1 tablet by mouth daily.      . Calcium Citrate (CITRACAL PO) Take 2 tablets by mouth daily.      . cetirizine (ZYRTEC) 10 MG tablet  Take 10 mg by mouth daily.      . Cholecalciferol (VITAMIN D3) 5000 UNITS CAPS Take 1 capsule by mouth daily.      . Cyanocobalamin (B-12) 2500 MCG SUBL Place 1 tablet under the tongue daily.      Marland Kitchen FOLIC ACID PO Take by mouth daily.      . Multiple Vitamin (MULTIVITAMIN) tablet Take 1 tablet by mouth daily.      . rosuvastatin (CRESTOR) 5 MG tablet Take 5 mg by mouth daily.      Marland Kitchen venlafaxine (EFFEXOR) 75 MG tablet Take 75 mg by mouth daily.      . ACETAMINOPHEN-BUTALBITAL (BUPAP) 50-650 MG TABS Take 1 tablet by mouth 2 (two) times daily as needed. Headache, tension, sinus or migraine  60 each  0  . buPROPion (WELLBUTRIN XL) 300 MG 24 hr tablet Take 1 tablet (300 mg total) by mouth daily. Start after 1 week of the 150 mg  30 tablet  3    No Known Allergies  Review of Systems  Review of Systems  Constitutional: Negative for fever and malaise/fatigue.  HENT: Negative  for congestion.   Eyes: Negative for discharge.  Respiratory: Negative for shortness of breath.   Cardiovascular: Negative for chest pain, palpitations and leg swelling.  Gastrointestinal: Negative for nausea, abdominal pain and diarrhea.  Genitourinary: Negative for dysuria.  Musculoskeletal: Negative for falls.  Skin: Positive for rash.  Neurological: Negative for loss of consciousness and headaches.  Endo/Heme/Allergies: Negative for polydipsia.  Psychiatric/Behavioral: Negative for depression and suicidal ideas. The patient is not nervous/anxious and does not have insomnia.     Objective  BP 114/74  Pulse 89  Temp 98 F (36.7 C) (Temporal)  Ht 5' 3.75" (1.619 m)  Wt 133 lb 12.8 oz (60.691 kg)  BMI 23.15 kg/m2  SpO2 95%  Physical Exam  Physical Exam  Constitutional: She is oriented to person, place, and time and well-developed, well-nourished, and in no distress. No distress.  HENT:  Head: Normocephalic and atraumatic.  Eyes: Conjunctivae normal are normal.  Neck: Neck supple. No thyromegaly present.  Cardiovascular: Normal rate, regular rhythm and normal heart sounds.   No murmur heard. Pulmonary/Chest: Effort normal and breath sounds normal. She has no wheezes.  Abdominal: She exhibits no distension and no mass.  Musculoskeletal: She exhibits no edema.  Lymphadenopathy:    She has no cervical adenopathy.  Neurological: She is alert and oriented to person, place, and time.  Skin: Skin is warm and dry. No rash noted. She is not diaphoretic.  Psychiatric: Memory, affect and judgment normal.    Lab Results  Component Value Date   TSH 0.36 12/16/2011   Lab Results  Component Value Date   WBC 10.3 12/10/2011   HGB 13.0 12/10/2011   HCT 39.3 12/10/2011   MCV 91.1 12/10/2011   PLT 280.0 12/10/2011   Lab Results  Component Value Date   CREATININE 0.8 12/16/2011   BUN 12 12/16/2011   NA 138 12/16/2011   K 5.3* 12/16/2011   CL 102 12/16/2011   CO2 28 12/16/2011   Lab Results   Component Value Date   ALT 24 12/10/2011   AST 25 12/10/2011   ALKPHOS 58 12/10/2011   BILITOT 0.5 12/10/2011   Lab Results  Component Value Date   CHOL 208* 12/10/2011   Lab Results  Component Value Date   HDL 61.10 12/10/2011   No results found for this basename: [facility]   Lab Results  Component Value  Date   TRIG 108.0 12/10/2011   Lab Results  Component Value Date   CHOLHDL 3 12/10/2011     Assessment & Plan  Allergic state Increased  gray phlegm with PND, continue antihistamines and may increase to bid  Tobacco abuse We will add Wellbutrin to Effexor and she may use ecig as well.  Hyperlipidemia Numbers improved on low dose of Crestor, add MegaRed caps daily.. Avoid trans fats  Folliculitis Wound in right groin cultured, significant scar tissue is noted, started on Bactrim bid and Bacitracin to nares qhs and call if no improvement

## 2011-12-16 NOTE — Patient Instructions (Addendum)
Folliculitis  °Folliculitis is an infection and inflammation of the hair follicles. Hair follicles become red and irritated. This inflammation is usually caused by bacteria. The bacteria thrive in warm, moist environments. This condition can be seen anywhere on the body.  °CAUSES °The most common cause of folliculitis is an infection by germs (bacteria). Fungal and viral infections can also cause the condition. Viral infections may be more common in people whose bodies are unable to fight disease well (weakened immune systems). Examples include people with: °· AIDS.  °· An organ transplant.  °· Cancer.  °People with depressed immune systems, diabetes, or obesity, have a greater risk of getting folliculitis than the general population. Certain chemicals, especially oils and tars, also can cause folliculitis. °SYMPTOMS °· An early sign of folliculitis is a small, white or yellow pus-filled, itchy lesion (pustule). These lesions appear on a red, inflamed follicle. They are usually less than 5 mm (.20 inches).  °· The most likely starting points are the scalp, thighs, legs, back and buttocks. Folliculitis is also frequently found in areas of repeated shaving.  °· When an infection of the follicle goes deeper, it becomes a boil or furuncle. A group of closely packed boils create a larger lesion (a carbuncle). These sores (lesions) tend to occur in hairy, sweaty areas of the body.  °TREATMENT  °· A doctor who specializes in skin problems (dermatologists) treats mild cases of folliculitis with antiseptic washes.  °· They also use a skin application which kills germs (topical antibiotics). Tea tree oil is a good topical antiseptic as well. It can be found at a health food store. A small percentage of individuals may develop an allergy to the tea tree oil.  °· Mild to moderate boils respond well to warm water compresses applied three times daily.  °· In some cases, oral antibiotics should be taken with the skin treatment.    °· If lesions contain large quantities of pus or fluid, your caregiver may drain them. This allows the topical antibiotics to get to the affected areas better.  °· Stubborn cases of folliculitis may respond to laser hair removal. This process uses a high intensity light beam (a laser) to destroy the follicle and reduces the scarring from folliculitis. After laser hair removal, hair will no longer grow in the laser treated area.  °Patients with long-lasting folliculitis need to find out where the infection is coming from. Germs can live in the nostrils of the patient. This can trigger an outbreak now and then. Sometimes the bacteria live in the nostrils of a family member. This person does not develop the disorder but they repeatedly re-expose others to the germ. To break the cycle of recurrence in the patient, the family member must also undergo treatment. °PREVENTION  °· Individuals who are predisposed to folliculitis should be extremely careful about personal hygiene.  °· Application of antiseptic washes may help prevent recurrences.  °· A topical antibiotic cream, mupirocin (Bactroban®), has been effective at reducing bacteria in the nostrils. It is applied inside the nose with your little finger. This is done twice daily for a week. Then it is repeated every 6 months.  °· Because follicle disorders tend to come back, patients must receive follow-up care. Your caregiver may be able to recognize a recurrence before it becomes severe.  °SEEK IMMEDIATE MEDICAL CARE IF:  °· You develop redness, swelling, or increasing pain in the area.  °· You have a fever.  °· You are not improving with treatment   or are getting worse.  °· You have any other questions or concerns.  °Document Released: 05/31/2001 Document Revised: 03/11/2011 Document Reviewed: 03/27/2008 °Exit[employer]® Patient Information ©2012 [employer]. °

## 2011-12-16 NOTE — Assessment & Plan Note (Signed)
We will add Wellbutrin to Effexor and she may use ecig as well.

## 2011-12-16 NOTE — Assessment & Plan Note (Addendum)
Increased  gray phlegm with PND, continue antihistamines and may increase to bid

## 2011-12-16 NOTE — Assessment & Plan Note (Signed)
Wound in right groin cultured, significant scar tissue is noted, started on Bactrim bid and Bacitracin to nares qhs and call if no improvement

## 2011-12-16 NOTE — Assessment & Plan Note (Signed)
Numbers improved on low dose of Crestor, add MegaRed caps daily.. Avoid trans fats

## 2011-12-17 LAB — VITAMIN D 1,25 DIHYDROXY
Vitamin D 1, 25 (OH)2 Total: 54 pg/mL (ref 18–72)
Vitamin D3 1, 25 (OH)2: 54 pg/mL

## 2011-12-20 LAB — WOUND CULTURE: Gram Stain: NONE SEEN

## 2011-12-20 NOTE — Progress Notes (Signed)
Quick Note:  Patient Informed and voiced understanding ______ 

## 2011-12-23 NOTE — Telephone Encounter (Signed)
Patient has an appt scheduled at [facility] 01/10/12

## 2012-01-07 ENCOUNTER — Other Ambulatory Visit: Payer: Self-pay

## 2012-01-07 NOTE — Telephone Encounter (Signed)
Pt left a message stating she needed medications refilled. I left a message for patients mother to have pt return my call and if I don't answer leave what medications on my voicemail

## 2012-01-11 MED ORDER — ROSUVASTATIN CALCIUM 5 MG PO TABS: 5.0000 mg | ORAL_TABLET | Freq: Every day | ORAL | Status: DC

## 2012-01-11 MED ORDER — VENLAFAXINE HCL 75 MG PO TABS: 75.0000 mg | ORAL_TABLET | Freq: Every day | ORAL | Status: DC

## 2012-01-11 NOTE — Telephone Encounter (Signed)
Patient called, the 2 medications are Venlafaxine and crestor to [facility] in [facility]. Please call patient at [phone] when Rx has been sent to pharmacy.

## 2012-01-11 NOTE — Telephone Encounter (Signed)
Pt informed

## 2012-01-28 ENCOUNTER — Ambulatory Visit: Payer: BC Managed Care – PPO | Admitting: Family Medicine

## 2012-02-04 ENCOUNTER — Ambulatory Visit (INDEPENDENT_AMBULATORY_CARE_PROVIDER_SITE_OTHER): Payer: BC Managed Care – PPO | Admitting: Family Medicine

## 2012-02-04 ENCOUNTER — Encounter: Payer: Self-pay | Admitting: Family Medicine

## 2012-02-04 VITALS — BP 101/67 | HR 85 | Temp 98.4°F | Ht 63.75 in | Wt 135.8 lb

## 2012-02-04 DIAGNOSIS — F341 Dysthymic disorder: Secondary | ICD-10-CM

## 2012-02-04 DIAGNOSIS — L732 Hidradenitis suppurativa: Secondary | ICD-10-CM

## 2012-02-04 DIAGNOSIS — L738 Other specified follicular disorders: Secondary | ICD-10-CM

## 2012-02-04 DIAGNOSIS — F17200 Nicotine dependence, unspecified, uncomplicated: Secondary | ICD-10-CM

## 2012-02-04 DIAGNOSIS — Z23 Encounter for immunization: Secondary | ICD-10-CM

## 2012-02-04 DIAGNOSIS — F418 Other specified anxiety disorders: Secondary | ICD-10-CM

## 2012-02-04 DIAGNOSIS — L739 Follicular disorder, unspecified: Secondary | ICD-10-CM

## 2012-02-04 DIAGNOSIS — Z720 Tobacco use: Secondary | ICD-10-CM

## 2012-02-04 MED ORDER — CHLORHEXIDINE GLUCONATE 4 % EX LIQD: 60.0000 mL | Freq: Every day | CUTANEOUS | Status: DC | PRN

## 2012-02-04 MED ORDER — CEPHALEXIN 500 MG PO CAPS: 500.0000 mg | ORAL_CAPSULE | Freq: Four times a day (QID) | ORAL | Status: DC

## 2012-02-04 NOTE — Assessment & Plan Note (Signed)
Most notable in right groin, responded to Bactrim but has flared again since stopping it. Will try Keflex 500 mg 4 times daily. She is given Hibiclens to wash. Continue mupirocin ointment in the nose. Report if no improvement. Start a probiotic

## 2012-02-04 NOTE — Progress Notes (Signed)
Patient ID: Michelle Arnold, female   DOB: 12/08/1965, 46 y.o.   MRN: [id number] [name] [id number] 12-10-1965 02/04/2012      Progress Note-Follow Up  Subjective  Chief Complaint  Chief Complaint  Patient presents with  . Follow-up    6 week    HPI  Patient is a 46 year old Caucasian female who is in today for followup. At her last visit we gave her antibiotics for some skin infections and she said she felt much better while taking it. She said she was much less tired and fatigued and the lesions all improved greatly. Recently the lesions have recurred again and are worse in the right groin. Tender. She denies fevers, chills, malaise or myalgias of any significance so far. Unfortunately she continues to smoke routinely. No chest pain, application, shortness of breath, GI or GU complaints noted.  Past Medical History  Diagnosis Date  . Chicken pox as a child  . Measles as a child  . Mumps as a child  . Hyperlipidemia   . Depression   . Anxiety   . CTS (carpal tunnel syndrome)     right  . Allergy   . Migraine 11/09/2011  . Tobacco abuse 11/09/2011  . Fatigue 11/09/2011  . Preventative health care 11/09/2011  . Dehydration 11/09/2011  . Depression with anxiety   . Dermatitis 11/09/2011    Right arm  . Hoarseness 11/09/2011  . Other abnormal Papanicolaou smear of cervix and cervical HPV(795.09) 2004    required LEEP procedure in 04 but no recurrence  . Folliculitis 12/16/2011    Past Surgical History  Procedure Date  . Leep 2004    Family History  Problem Relation Age of Onset  . Diabetes Mother     ?  Marland Kitchen Osteoporosis Mother   . Hyperlipidemia Father   . Heart disease Father     MI in 9  . Hypertension Father   . Heart attack Father 2  . Cancer Maternal Grandmother     lung- smoker  . Osteoporosis Maternal Grandmother   . Cancer Maternal Grandfather     prostate  . Cataracts Paternal Grandmother   . Other Paternal Grandfather     PAD with gangrene    History    Social History  . Marital Status: Married    Spouse Name: N/A    Number of Children: N/A  . Years of Education: N/A   Occupational History  . Not on file.   Social History Main Topics  . Smoking status: Current Every Day Smoker -- 1.0 packs/day for 30 years    Types: Cigarettes  . Smokeless tobacco: Never Used  . Alcohol Use: Yes     very rare  . Drug Use: No  . Sexually Active: Yes -- Female partner(s)   Other Topics Concern  . Not on file   Social History Narrative  . No narrative on file    Current Outpatient Prescriptions on File Prior to Visit  Medication Sig Dispense Refill  . B Complex-C (SUPER B COMPLEX/VITAMIN C) TABS Take 1 tablet by mouth daily.      . bifidobacterium infantis (ALIGN) capsule Take 1 capsule by mouth daily.      . Calcium Citrate (CITRACAL PO) Take 2 tablets by mouth daily.      . cetirizine (ZYRTEC) 10 MG tablet Take 10 mg by mouth daily.      . Cholecalciferol (VITAMIN D3) 5000 UNITS CAPS Take 1 capsule by mouth daily.      Marland Kitchen  Cyanocobalamin (B-12) 2500 MCG SUBL Place 1 tablet under the tongue daily.      Marland Kitchen FOLIC ACID PO Take by mouth daily.      . Multiple Vitamin (MULTIVITAMIN) tablet Take 1 tablet by mouth daily.      . rosuvastatin (CRESTOR) 5 MG tablet Take 1 tablet (5 mg total) by mouth daily.  30 tablet  3  . venlafaxine (EFFEXOR) 75 MG tablet Take 1 tablet (75 mg total) by mouth daily.  30 tablet  3    No Known Allergies  Review of Systems  Review of Systems  Constitutional: Negative for fever and malaise/fatigue.  HENT: Negative for congestion.   Eyes: Negative for discharge.  Respiratory: Negative for shortness of breath.   Cardiovascular: Negative for chest pain, palpitations and leg swelling.  Gastrointestinal: Negative for nausea, abdominal pain and diarrhea.  Genitourinary: Negative for dysuria.  Musculoskeletal: Negative for falls.  Skin: Positive for rash.  Neurological: Negative for loss of consciousness and  headaches.  Endo/Heme/Allergies: Negative for polydipsia.  Psychiatric/Behavioral: Negative for depression and suicidal ideas. The patient is not nervous/anxious and does not have insomnia.     Objective  BP 101/67  Pulse 85  Temp 98.4 F (36.9 C) (Temporal)  Ht 5' 3.75" (1.619 m)  Wt 135 lb 12.8 oz (61.598 kg)  BMI 23.49 kg/m2  SpO2 97%  Physical Exam  Physical Exam  Constitutional: She is oriented to person, place, and time and well-developed, well-nourished, and in no distress. No distress.  HENT:  Head: Normocephalic and atraumatic.  Eyes: Conjunctivae normal are normal.  Neck: Neck supple. No thyromegaly present.  Cardiovascular: Normal rate, regular rhythm and normal heart sounds.   No murmur heard. Pulmonary/Chest: Effort normal and breath sounds normal. She has no wheezes.  Abdominal: She exhibits no distension and no mass.  Musculoskeletal: She exhibits no edema.  Lymphadenopathy:    She has no cervical adenopathy.  Neurological: She is alert and oriented to person, place, and time.  Skin: Skin is warm and dry. Rash noted. She is not diaphoretic. There is erythema.       Follicular lesion, mildly tender in right groin with central head with slight pus under.   Psychiatric: Memory, affect and judgment normal.    Lab Results  Component Value Date   TSH 0.36 12/16/2011   Lab Results  Component Value Date   WBC 10.3 12/10/2011   HGB 13.0 12/10/2011   HCT 39.3 12/10/2011   MCV 91.1 12/10/2011   PLT 280.0 12/10/2011   Lab Results  Component Value Date   CREATININE 0.8 12/16/2011   BUN 12 12/16/2011   NA 138 12/16/2011   K 5.3* 12/16/2011   CL 102 12/16/2011   CO2 28 12/16/2011   Lab Results  Component Value Date   ALT 24 12/10/2011   AST 25 12/10/2011   ALKPHOS 58 12/10/2011   BILITOT 0.5 12/10/2011   Lab Results  Component Value Date   CHOL 208* 12/10/2011   Lab Results  Component Value Date   HDL 61.10 12/10/2011   No results found for this basename: LDLCALC   Lab  Results  Component Value Date   TRIG 108.0 12/10/2011   Lab Results  Component Value Date   CHOLHDL 3 12/10/2011     Assessment & Plan  Depression with anxiety Took wellbutrin for 2 days for no reason, did not have any trouble with it. May try again.   Tobacco abuse Unfortunately continues to smoke encouraged to attempt  cessation again  Folliculitis Most notable in right groin, responded to Bactrim but has flared again since stopping it. Will try Keflex 500 mg 4 times daily. She is given Hibiclens to wash. Continue mupirocin ointment in the nose. Report if no improvement. Start a probiotic

## 2012-02-04 NOTE — Assessment & Plan Note (Signed)
Unfortunately continues to smoke encouraged to attempt cessation again

## 2012-02-04 NOTE — Patient Instructions (Addendum)
Hidradenitis Suppurativa, Sweat Gland Abscess  Hidradenitis suppurativa is a long lasting (chronic), uncommon disease of the sweat glands. With this, boil-like lumps and scarring develop in the groin, some times under the arms (axillae), and under the breasts. It may also uncommonly occur behind the ears, in the crease of the buttocks, and around the genitals.   CAUSES   The cause is from a blocking of the sweat glands. They then become infected. It may cause drainage and odor. It is not contagious. So it cannot be given to someone else. It most often shows up in puberty (about 10 to 48 years of age). But it may happen much later. It is similar to acne which is a disease of the sweat glands. This condition is slightly more common in African-Americans and women.  SYMPTOMS    Hidradenitis usually starts as one or more red, tender, swellings in the groin or under the arms (axilla).   Over a period of hours to days the lesions get larger. They often open to the skin surface, draining clear to yellow-colored fluid.   The infected area heals with scarring.  DIAGNOSIS   Your caregiver makes this diagnosis by looking at you. Sometimes cultures (growing germs on plates in the lab) may be taken. This is to see what germ (bacterium) is causing the infection.   TREATMENT    Topical germ killing medicine applied to the skin (antibiotics) are the treatment of choice. Antibiotics taken by mouth (systemic) are sometimes needed when the condition is getting worse or is severe.   Avoid tight-fitting clothing which traps moisture in.   Dirt does not cause hidradenitis and it is not caused by poor hygiene.   Involved areas should be cleaned daily using an antibacterial soap. Some patients find that the liquid form of Lever 2000, applied to the involved areas as a lotion after bathing, can help reduce the odor related to this condition.   Sometimes surgery is needed to drain infected areas or remove scarred tissue. Removal of  large amounts of tissue is used only in severe cases.   Birth control pills may be helpful.   Oral retinoids (vitamin A derivatives) for 6 to 12 months which are effective for acne may also help this condition.   Weight loss will improve but not cure hidradenitis. It is made worse by being overweight. But the condition is not caused by being overweight.   This condition is more common in people who have had acne.   It may become worse under stress.  There is no medical cure for hidradenitis. It can be controlled, but not cured. The condition usually continues for years with periods of getting worse and getting better (remission).  Document Released: 11/04/2003 Document Revised: 06/14/2011 Document Reviewed: 11/20/2007  [employer] Patient Information 2013 [employer].

## 2012-02-04 NOTE — Assessment & Plan Note (Signed)
Took wellbutrin for 2 days for no reason, did not have any trouble with it. May try again.

## 2012-04-11 ENCOUNTER — Ambulatory Visit: Payer: BC Managed Care – PPO | Admitting: Family Medicine

## 2012-04-18 ENCOUNTER — Ambulatory Visit: Payer: BC Managed Care – PPO | Admitting: Family Medicine

## 2012-04-19 ENCOUNTER — Ambulatory Visit: Payer: BC Managed Care – PPO | Admitting: Family Medicine

## 2012-04-19 ENCOUNTER — Ambulatory Visit (INDEPENDENT_AMBULATORY_CARE_PROVIDER_SITE_OTHER): Payer: BC Managed Care – PPO | Admitting: Family Medicine

## 2012-04-19 ENCOUNTER — Encounter: Payer: Self-pay | Admitting: Family Medicine

## 2012-04-19 VITALS — BP 112/75 | HR 75 | Temp 99.3°F | Ht 63.75 in | Wt 133.1 lb

## 2012-04-19 DIAGNOSIS — F17200 Nicotine dependence, unspecified, uncomplicated: Secondary | ICD-10-CM

## 2012-04-19 DIAGNOSIS — L732 Hidradenitis suppurativa: Secondary | ICD-10-CM

## 2012-04-19 DIAGNOSIS — E785 Hyperlipidemia, unspecified: Secondary | ICD-10-CM

## 2012-04-19 DIAGNOSIS — Z720 Tobacco use: Secondary | ICD-10-CM

## 2012-04-19 MED ORDER — AMOXICILLIN-POT CLAVULANATE 875-125 MG PO TABS: 1.0000 | ORAL_TABLET | Freq: Two times a day (BID) | ORAL | Status: DC

## 2012-04-19 NOTE — Assessment & Plan Note (Signed)
Recurrent in various site in groin. Encouraged bath with 1 tbls of bleach. Add  Back probiotics. Start Bactrim and use Hiibicleans.

## 2012-04-19 NOTE — Progress Notes (Signed)
Patient ID: Michelle Arnold, female   DOB: 10-Mar-1966, 47 y.o.   MRN: [id number] Michelle Arnold [id number] 21-Aug-1965 04/19/2012      Progress Note-Follow Up  Subjective  Chief Complaint  Chief Complaint  Patient presents with  . Follow-up    2 month  . boil    painful    HPI  Patient is a 47 year old Caucasian female who is in today for followup. She has had a recurrence of her hidradenitis. The left inguinal region is worse in the right she has lesions bilaterally. Does have some fatigue but denies fevers, chills, myalgias otherwise.  Has been able to person place and some lesions in. Did find her topical wash somewhat helpful at her last visit and they did not recur for a while. No recent illness. No chest pain, palpitations, shortness of breath, GI or GU complaints. She does still continue to worry about her daughter endorses been under a great deal of stress but feels she is handling it relatively well  Past Medical History  Diagnosis Date  . Chicken pox as a child  . Measles as a child  . Mumps as a child  . Hyperlipidemia   . Depression   . Anxiety   . CTS (carpal tunnel syndrome)     right  . Allergy   . Migraine 11/09/2011  . Tobacco abuse 11/09/2011  . Fatigue 11/09/2011  . Preventative health care 11/09/2011  . Dehydration 11/09/2011  . Depression with anxiety   . Dermatitis 11/09/2011    Right arm  . Hoarseness 11/09/2011  . Other abnormal Papanicolaou smear of cervix and cervical HPV(795.09) 2004    required LEEP procedure in 04 but no recurrence  . Folliculitis 12/16/2011  . Hidradenitis suppurativa 12/16/2011    Past Surgical History  Procedure Date  . Leep 2004    Family History  Problem Relation Age of Onset  . Diabetes Mother     ?  Marland Kitchen Osteoporosis Mother   . Hyperlipidemia Father   . Heart disease Father     MI in 26  . Hypertension Father   . Heart attack Father 86  . Cancer Maternal Grandmother     lung- smoker  . Osteoporosis Maternal Grandmother   .  Cancer Maternal Grandfather     prostate  . Cataracts Paternal Grandmother   . Other Paternal Grandfather     PAD with gangrene    History   Social History  . Marital Status: Married    Spouse Name: N/A    Number of Children: N/A  . Years of Education: N/A   Occupational History  . Not on file.   Social History Main Topics  . Smoking status: Current Every Day Smoker -- 1.0 packs/day for 30 years    Types: Cigarettes  . Smokeless tobacco: Never Used  . Alcohol Use: Yes     Comment: very rare  . Drug Use: No  . Sexually Active: Yes -- Female partner(s)   Other Topics Concern  . Not on file   Social History Narrative  . No narrative on file    Current Outpatient Prescriptions on File Prior to Visit  Medication Sig Dispense Refill  . B Complex-C (SUPER B COMPLEX/VITAMIN C) TABS Take 1 tablet by mouth daily.      . bifidobacterium infantis (ALIGN) capsule Take 1 capsule by mouth daily.      . cetirizine (ZYRTEC) 10 MG tablet Take 10 mg by mouth daily.      Marland Kitchen  Cholecalciferol (VITAMIN D3) 5000 UNITS CAPS Take 1 capsule by mouth daily.      . Cyanocobalamin (B-12) 2500 MCG SUBL Place 1 tablet under the tongue daily.      Marland Kitchen FOLIC ACID PO Take by mouth daily.      . Multiple Vitamin (MULTIVITAMIN) tablet Take 1 tablet by mouth daily.      . rosuvastatin (CRESTOR) 5 MG tablet Take 1 tablet (5 mg total) by mouth daily.  30 tablet  3  . venlafaxine (EFFEXOR) 75 MG tablet Take 1 tablet (75 mg total) by mouth daily.  30 tablet  3  . Calcium Citrate (CITRACAL PO) Take 2 tablets by mouth daily.        No Known Allergies  Review of Systems  Review of Systems  Constitutional: Negative for fever and malaise/fatigue.  HENT: Negative for congestion.   Eyes: Negative for discharge.  Respiratory: Negative for shortness of breath.   Cardiovascular: Negative for chest pain, palpitations and leg swelling.  Gastrointestinal: Negative for nausea, abdominal pain and diarrhea.    Genitourinary: Negative for dysuria.  Musculoskeletal: Negative for falls.  Skin: Negative for rash.       Painful lesions in groin  Neurological: Negative for loss of consciousness and headaches.  Endo/Heme/Allergies: Negative for polydipsia.  Psychiatric/Behavioral: Negative for depression and suicidal ideas. The patient is not nervous/anxious and does not have insomnia.     Objective  BP 112/75  Pulse 75  Temp 99.3 F (37.4 C) (Temporal)  Ht 5' 3.75" (1.619 m)  Wt 133 lb 1.9 oz (60.383 kg)  BMI 23.03 kg/m2  SpO2 97%  Physical Exam  Physical Exam  Constitutional: She is oriented to person, place, and time and well-developed, well-nourished, and in no distress. No distress.  HENT:  Head: Normocephalic and atraumatic.  Eyes: Conjunctivae normal are normal.  Neck: Neck supple. No thyromegaly present.  Cardiovascular: Normal rate, regular rhythm and normal heart sounds.   No murmur heard. Pulmonary/Chest: Effort normal and breath sounds normal. She has no wheezes.  Abdominal: She exhibits no distension and no mass.  Musculoskeletal: She exhibits no edema.  Lymphadenopathy:    She has no cervical adenopathy.  Neurological: She is alert and oriented to person, place, and time.  Skin: Skin is warm and dry. No rash noted. She is not diaphoretic.       Firm, red raised follicular lesions in left groin>right, no discharge  Psychiatric: Memory, affect and judgment normal.    Lab Results  Component Value Date   TSH 0.36 12/16/2011   Lab Results  Component Value Date   WBC 10.3 12/10/2011   HGB 13.0 12/10/2011   HCT 39.3 12/10/2011   MCV 91.1 12/10/2011   PLT 280.0 12/10/2011   Lab Results  Component Value Date   CREATININE 0.8 12/16/2011   BUN 12 12/16/2011   NA 138 12/16/2011   K 5.3* 12/16/2011   CL 102 12/16/2011   CO2 28 12/16/2011   Lab Results  Component Value Date   ALT 24 12/10/2011   AST 25 12/10/2011   ALKPHOS 58 12/10/2011   BILITOT 0.5 12/10/2011   Lab Results   Component Value Date   CHOL 208* 12/10/2011   Lab Results  Component Value Date   HDL 61.10 12/10/2011   No results found for this basename: [facility]   Lab Results  Component Value Date   TRIG 108.0 12/10/2011   Lab Results  Component Value Date   CHOLHDL 3 12/10/2011  Assessment & Plan  Hidradenitis suppurativa Recurrent in various site in groin. Encouraged bath with 1 tbls of bleach. Add  Back probiotics. Start Bactrim and use Hiibicleans.   Tobacco abuse Unfortunately continues to smoke encouraged ongoing attempts at cessation  Hyperlipidemia Tolerating Crestor, encouraged krill oil caps, avoid trans fats.

## 2012-04-19 NOTE — Assessment & Plan Note (Signed)
Unfortunately continues to smoke encouraged ongoing attempts at cessation

## 2012-04-19 NOTE — Patient Instructions (Addendum)
  Try bath with bleach once a week Hidradenitis Suppurativa, Sweat Gland Abscess Hidradenitis suppurativa is a long lasting (chronic), uncommon disease of the sweat glands. With this, boil-like lumps and scarring develop in the groin, some times under the arms (axillae), and under the breasts. It may also uncommonly occur behind the ears, in the crease of the buttocks, and around the genitals.  CAUSES  The cause is from a blocking of the sweat glands. They then become infected. It may cause drainage and odor. It is not contagious. So it cannot be given to someone else. It most often shows up in puberty (about 57 to 49 years of age). But it may happen much later. It is similar to acne which is a disease of the sweat glands. This condition is slightly more common in African-Americans and women. SYMPTOMS   Hidradenitis usually starts as one or more red, tender, swellings in the groin or under the arms (axilla).  Over a period of hours to days the lesions get larger. They often open to the skin surface, draining clear to yellow-colored fluid.  The infected area heals with scarring. DIAGNOSIS  Your caregiver makes this diagnosis by looking at you. Sometimes cultures (growing germs on plates in the lab) may be taken. This is to see what germ (bacterium) is causing the infection.  TREATMENT   Topical germ killing medicine applied to the skin (antibiotics) are the treatment of choice. Antibiotics taken by mouth (systemic) are sometimes needed when the condition is getting worse or is severe.  Avoid tight-fitting clothing which traps moisture in.  Dirt does not cause hidradenitis and it is not caused by poor hygiene.  Involved areas should be cleaned daily using an antibacterial soap. Some patients find that the liquid form of Lever 2000, applied to the involved areas as a lotion after bathing, can help reduce the odor related to this condition.  Sometimes surgery is needed to drain infected areas or  remove scarred tissue. Removal of large amounts of tissue is used only in severe cases.  Birth control pills may be helpful.  Oral retinoids (vitamin A derivatives) for 6 to 12 months which are effective for acne may also help this condition.  Weight loss will improve but not cure hidradenitis. It is made worse by being overweight. But the condition is not caused by being overweight.  This condition is more common in people who have had acne.  It may become worse under stress. There is no medical cure for hidradenitis. It can be controlled, but not cured. The condition usually continues for years with periods of getting worse and getting better (remission). Document Released: 11/04/2003 Document Revised: 06/14/2011 Document Reviewed: 11/20/2007 [facility] Patient Information 2013 [place], [place].

## 2012-04-19 NOTE — Assessment & Plan Note (Signed)
Tolerating Crestor, encouraged krill oil caps, avoid trans fats.

## 2012-10-24 ENCOUNTER — Ambulatory Visit: Payer: BC Managed Care – PPO | Admitting: Family Medicine

## 2012-10-30 ENCOUNTER — Other Ambulatory Visit: Payer: Self-pay | Admitting: Family Medicine

## 2013-10-03 ENCOUNTER — Encounter: Payer: Self-pay | Admitting: Nurse Practitioner

## 2013-10-03 ENCOUNTER — Ambulatory Visit (INDEPENDENT_AMBULATORY_CARE_PROVIDER_SITE_OTHER): Payer: BC Managed Care – PPO | Admitting: Nurse Practitioner

## 2013-10-03 VITALS — BP 102/66 | HR 82 | Temp 98.0°F | Resp 18 | Ht 64.0 in | Wt 129.0 lb

## 2013-10-03 DIAGNOSIS — N3001 Acute cystitis with hematuria: Secondary | ICD-10-CM

## 2013-10-03 DIAGNOSIS — N3000 Acute cystitis without hematuria: Secondary | ICD-10-CM

## 2013-10-03 DIAGNOSIS — R300 Dysuria: Secondary | ICD-10-CM

## 2013-10-03 LAB — POCT URINALYSIS DIPSTICK
BILIRUBIN UA: NEGATIVE
KETONES UA: NEGATIVE
Nitrite, UA: NEGATIVE
PROTEIN UA: NEGATIVE
SPEC GRAV UA: 1.01
Urobilinogen, UA: 0.2
pH, UA: 7

## 2013-10-03 MED ORDER — PHENAZOPYRIDINE HCL 200 MG PO TABS: 200.0000 mg | ORAL_TABLET | Freq: Three times a day (TID) | ORAL | Status: DC | PRN

## 2013-10-03 MED ORDER — CEFUROXIME AXETIL 250 MG PO TABS: 250.0000 mg | ORAL_TABLET | Freq: Two times a day (BID) | ORAL | Status: DC

## 2013-10-03 MED ORDER — CEFTRIAXONE SODIUM 1 G IJ SOLR
1.0000 g | Freq: Once | INTRAMUSCULAR | Status: AC
  Administered 2013-10-03: 1 g via INTRAMUSCULAR

## 2013-10-03 NOTE — Patient Instructions (Addendum)
Start antibiotic tomorrow. Our office will call if we need to change the antibiotic. Take pyridium to relax bladder, caution: urine tears & sweat will be orange. Do not be alarmed! Sip hydrating fluids (water, juice, colorless soda, decaff tea) every hour to flush kidneys. Eat yogurt daily to help prevent antibiotic associated diarrhea. Return early next week for follow up or sooner if symptoms you feel worse.  Urinary Tract Infection Urinary tract infections (UTIs) can develop anywhere along your urinary tract. Your urinary tract is your body's drainage system for removing wastes and extra water. Your urinary tract includes two kidneys, two ureters, a bladder, and a urethra. Your kidneys are a pair of bean-shaped organs. Each kidney is about the size of your fist. They are located below your ribs, one on each side of your spine. CAUSES Infections are caused by microbes, which are microscopic organisms, including fungi, viruses, and bacteria. These organisms are so small that they can only be seen through a microscope. Bacteria are the microbes that most commonly cause UTIs. SYMPTOMS  Symptoms of UTIs may vary by age and gender of the patient and by the location of the infection. Symptoms in young women typically include a frequent and intense urge to urinate and a painful, burning feeling in the bladder or urethra during urination. Older women and men are more likely to be tired, shaky, and weak and have muscle aches and abdominal pain. A fever may mean the infection is in your kidneys. Other symptoms of a kidney infection include pain in your back or sides below the ribs, nausea, and vomiting. DIAGNOSIS To diagnose a UTI, your caregiver will ask you about your symptoms. Your caregiver also will ask to provide a urine sample. The urine sample will be tested for bacteria and white blood cells. White blood cells are made by your body to help fight infection. TREATMENT  Typically, UTIs can be treated with  medication. Because most UTIs are caused by a bacterial infection, they usually can be treated with the use of antibiotics. The choice of antibiotic and length of treatment depend on your symptoms and the type of bacteria causing your infection. HOME CARE INSTRUCTIONS  If you were prescribed antibiotics, take them exactly as your caregiver instructs you. Finish the medication even if you feel better after you have only taken some of the medication.  Drink enough water and fluids to keep your urine clear or pale yellow.  Avoid caffeine, tea, and carbonated beverages. They tend to irritate your bladder.  Empty your bladder often. Avoid holding urine for long periods of time.  Empty your bladder before and after sexual intercourse.  After a bowel movement, women should cleanse from front to back. Use each tissue only once. SEEK MEDICAL CARE IF:   You have back pain.  You develop a fever.  Your symptoms do not begin to resolve within 3 days. SEEK IMMEDIATE MEDICAL CARE IF:   You have severe back pain or lower abdominal pain.  You develop chills.  You have nausea or vomiting.  You have continued burning or discomfort with urination. MAKE SURE YOU:   Understand these instructions.  Will watch your condition.  Will get help right away if you are not doing well or get worse. Document Released: 12/30/2004 Document Revised: 09/21/2011 Document Reviewed: 04/30/2011 [facility] Patient Information 2014 [place], [place].

## 2013-10-03 NOTE — Progress Notes (Signed)
   Subjective:    Patient ID: Michelle SenateLisa M Zamorano, female    DOB: 1965-05-26, 48 y.o.   MRN: [id number]  Dysuria  This is a new problem. The current episode started in the past 7 days (6days). The problem occurs every urination. The problem has been gradually worsening. The quality of the pain is described as aching and burning. The pain is mild. There has been no fever (feeling clammy). She is sexually active. There is no history of pyelonephritis. Associated symptoms include chills, frequency, nausea and urgency. Pertinent negatives include no discharge, flank pain, hematuria, hesitancy or vomiting. Associated symptoms comments: Fatigued . She has tried increased fluids (AZO) for the symptoms. The treatment provided mild (felt better, then worse) relief.      Review of Systems  Constitutional: Positive for chills and fatigue.  Gastrointestinal: Positive for nausea. Negative for vomiting.  Genitourinary: Positive for dysuria, urgency and frequency. Negative for hesitancy, hematuria, flank pain, decreased urine volume, vaginal discharge and difficulty urinating.       Objective:   Physical Exam  Vitals reviewed. Constitutional: She is oriented to person, place, and time. She appears well-developed and well-nourished. No distress.  Observed pt for 20 minutes after injection of IM rocephin. Feeling well, alert.  HENT:  Head: Normocephalic and atraumatic.  Eyes: Conjunctivae are normal. Right eye exhibits no discharge. Left eye exhibits no discharge.  Cardiovascular: Normal rate, regular rhythm and normal heart sounds.   No murmur heard. Pulmonary/Chest: Effort normal. No respiratory distress.  Abdominal: Soft. She exhibits no distension. There is tenderness (R side, adjacent to umbilicus). There is no rebound and no guarding.  Musculoskeletal: She exhibits no tenderness (no CVA tenderness).  Neurological: She is alert and oriented to person, place, and time.  Skin: Skin is warm and dry.    Psychiatric: She has a normal mood and affect. Her behavior is normal. Thought content normal.          Assessment & Plan:  1. Dysuria - POCT urinalysis dipstick-lg blood, leuks - Urine culture  2. Acute cystitis with hematuria Fatigue & nausea - cefTRIAXone (ROCEPHIN) injection 1 g; Inject 1 g into the muscle once. - cefUROXime (CEFTIN) 250 MG tablet; Take 1 tablet (250 mg total) by mouth 2 (two) times daily with a meal.  Dispense: 12 tablet; Refill: 0 - phenazopyridine (PYRIDIUM) 200 MG tablet; Take 1 tablet (200 mg total) by mouth 3 (three) times daily as needed for pain.  Dispense: 6 tablet; Refill: 0 F/u 5 days. See pt instructions.

## 2013-10-07 LAB — URINE CULTURE: Colony Count: >=100000

## 2013-10-08 ENCOUNTER — Encounter: Payer: Self-pay | Admitting: Family Medicine

## 2013-10-08 ENCOUNTER — Ambulatory Visit (INDEPENDENT_AMBULATORY_CARE_PROVIDER_SITE_OTHER): Payer: BC Managed Care – PPO | Admitting: Family Medicine

## 2013-10-08 VITALS — BP 102/67 | HR 74 | Temp 98.6°F | Ht 64.0 in | Wt 130.0 lb

## 2013-10-08 DIAGNOSIS — H6120 Impacted cerumen, unspecified ear: Secondary | ICD-10-CM

## 2013-10-08 DIAGNOSIS — H6122 Impacted cerumen, left ear: Secondary | ICD-10-CM

## 2013-10-08 DIAGNOSIS — R300 Dysuria: Secondary | ICD-10-CM

## 2013-10-08 LAB — POCT URINALYSIS DIPSTICK
Bilirubin, UA: NEGATIVE
Glucose, UA: NEGATIVE
KETONES UA: NEGATIVE
LEUKOCYTES UA: NEGATIVE
NITRITE UA: NEGATIVE
PH UA: 6
PROTEIN UA: NEGATIVE
Spec Grav, UA: 1.015
UROBILINOGEN UA: 0.2

## 2013-10-08 NOTE — Progress Notes (Signed)
OFFICE NOTE  10/08/2013  CC:  Chief Complaint  Patient presents with  . Follow-up     HPI: Patient is a 48 y.o. Caucasian female who is here for 5d f/u recent urinary tract infection, E coli sensitive to cephalosporins and others, pt has been on ceftin 250mg  bid since that visit.  Also got an injection of 1g of Rocephin at that visit for worry of early upper tract involvement.   She is happy to report that she is feeling much improved, energy level up quite a bit, nausea resolved.   Not taking Azo anymore.    No hx of kidney stones and no known hx of microhematuria in the past.  She took 2 ibuprofen yesterday, none today. Denies pain in CVA or flank areas or back or abdomen. Not menstruating anymore.  Pt asks me to look in her left ear b/c it has felt full/like there is something in it for a while now.  Pertinent PMH:  Past medical, surgical, social, and family history reviewed and no changes are noted since last office visit.  MEDS: Pt not taking augmentin listed below Outpatient Prescriptions Prior to Visit  Medication Sig Dispense Refill  . B Complex-C (SUPER B COMPLEX/VITAMIN C) TABS Take 1 tablet by mouth daily.      . Calcium Citrate (CITRACAL PO) Take 2 tablets by mouth daily.      . cefUROXime (CEFTIN) 250 MG tablet Take 1 tablet (250 mg total) by mouth 2 (two) times daily with a meal.  12 tablet  0  . cetirizine (ZYRTEC) 10 MG tablet Take 10 mg by mouth daily.      . Cholecalciferol (VITAMIN D3) 5000 UNITS CAPS Take 1 capsule by mouth daily.      . CRESTOR 5 MG tablet TAKE 1 TABLET (5 MG TOTAL) BY MOUTH DAILY.  30 tablet  3  . Cyanocobalamin (B-12) 2500 MCG SUBL Place 1 tablet under the tongue daily.      Marland Kitchen. FOLIC ACID PO Take by mouth daily.      . Multiple Vitamin (MULTIVITAMIN) tablet Take 1 tablet by mouth daily.      . phenazopyridine (PYRIDIUM) 200 MG tablet Take 1 tablet (200 mg total) by mouth 3 (three) times daily as needed for pain.  6 tablet  0  . venlafaxine  (EFFEXOR) 75 MG tablet Take 1 tablet (75 mg total) by mouth daily.  30 tablet  3  . amoxicillin-clavulanate (AUGMENTIN) 875-125 MG per tablet Take 1 tablet by mouth 2 (two) times daily.  28 tablet  0   No facility-administered medications prior to visit.    PE: Blood pressure 102/67, pulse 74, temperature 98.6 F (37 C), temperature source Temporal, height 5\' 4"  (1.626 m), weight 130 lb (58.968 kg), SpO2 97.00%. Gen: Alert, well appearing.  Patient is oriented to person, place, time, and situation. Left EAC with 95%% cerumen impaction.  Right eAC normal, TM normal.  LAB:  CC UA today showed moderate blood, otherwise normal.  IMPRESSION AND PLAN:  1) Resolving urinary tract infection, e coli, has 3 doses of ceftin remaining. Reassured pt regarding microhematuria at this juncture, but if repeat UA without sx's in 2 weeks still shows blood then would proceed with routine microhematuria work up.  She says she actually has a PCP with [facility] in [place] so she will be following up there regarding this matter.  2) Left ear cerumen impaction: irrigated completely clear today by CMA.  TM appeared normal.  An After  Visit Summary was printed and given to the patient.  FOLLOW UP:  Go to [facility] in [place] for f/u in 2 wks.

## 2013-10-08 NOTE — Progress Notes (Signed)
Pre visit review using our clinic review tool, if applicable. No additional management support is needed unless otherwise documented below in the visit note. 

## 2013-11-29 LAB — HM COLONOSCOPY

## 2014-12-10 ENCOUNTER — Other Ambulatory Visit: Payer: Self-pay | Admitting: Nurse Practitioner

## 2014-12-10 ENCOUNTER — Other Ambulatory Visit (HOSPITAL_COMMUNITY)
Admission: RE | Admit: 2014-12-10 | Discharge: 2014-12-10 | Disposition: A | Discharge status: Home / Self Care | Payer: BLUE CROSS/BLUE SHIELD | Source: Ambulatory Visit | Attending: Nurse Practitioner | Admitting: Nurse Practitioner

## 2014-12-10 DIAGNOSIS — Z01419 Encounter for gynecological examination (general) (routine) without abnormal findings: Secondary | ICD-10-CM | POA: Insufficient documentation

## 2014-12-10 DIAGNOSIS — Z1151 Encounter for screening for human papillomavirus (HPV): Secondary | ICD-10-CM | POA: Diagnosis not present

## 2014-12-12 LAB — CYTOLOGY - PAP

## 2015-01-07 ENCOUNTER — Other Ambulatory Visit: Payer: Self-pay | Admitting: Surgery

## 2015-01-07 NOTE — H&P (Signed)
Michelle Arnold Arnold 01/07/2015 3:12 PM Location: [facility] Patient #: [id number] DOB: [birth year] Married / Language: English / Race: White Female History of Present Illness Michelle Sportsman MD; 01/07/2015 5:29 PM) The patient is a 49 year old female who presents with a complaint of skin problems. Patient sent for surgical consultation by her care physician Michelle Arnold Arnold. Concern for LEFT groin/thigh chronic drainage ?Abscess.  Pleasant female. Chronic smoker. Otherwise rather healthy. His had issues with cysts and drainage areas in her groins. Usually she can do a hot compress her pop and herself clears up. There is a nodule down in her private region that will go away. Not resolved with antibiotics. She does get problems in the axilla breast or other locations. She does smoke. No history of urinary incontinence. She rarely has urinary tract infections and certainly not recently. Usually has a bowel movement every day. Other Problems Michelle Arnold Arnold, Michelle Arnold; 01/07/2015 3:13 PM) Anxiety Disorder Depression Hypercholesterolemia Migraine Headache  Past Surgical History Michelle Arnold Arnold, Michelle Arnold; 01/07/2015 3:13 PM) No pertinent past surgical history  Diagnostic Studies History Michelle Arnold Arnold, Michelle Arnold; 01/07/2015 3:13 PM) Mammogram 1-3 years ago Pap Smear 1-5 years ago  Allergies Michelle Arnold Arnold, Michelle Arnold; 01/07/2015 3:13 PM) No Known Drug Allergies 01/07/2015  Medication History Michelle Arnold Arnold, Michelle Arnold; 01/07/2015 3:15 PM) Venlafaxine HCl ER (  Capsule ER 24HR, Oral) Active. Crestor (  Tablet, Oral) Active. B Complex (Oral) Active. Ceftin (  Tablet, Oral) Active. Vitamin D3 (50000UNIT Tablet, Oral) Active. Folic Acid Active. Effexor XR (  Capsule ER 24HR, Oral) Active. Medications Reconciled  Social History Michelle Arnold Arnold, Michelle Arnold Arnold; 01/07/2015 3:13 PM) Alcohol use Occasional alcohol use. Caffeine use Coffee, Tea. No drug use Tobacco use Current every day smoker.  Family  History Michelle Arnold Arnold, Michelle Arnold Arnold; 01/07/2015 3:13 PM) Alcohol Abuse Brother, Father. Arthritis Father, Mother. Breast Cancer Family Members In General. Depression Father, Mother. Diabetes Mellitus Mother. Heart Disease Father. Heart disease in female family member before age 45 Hypertension Father. Malignant Neoplasm Of Pancreas Family Members In General. Migraine Headache Brother.  Pregnancy / Birth History Michelle Arnold Arnold, Michelle Arnold; 01/07/2015 3:13 PM) Age at menarche 13 years. Age of menopause <43 Contraceptive History Oral contraceptives. Gravida 2 Maternal age 75-30 Para 2 Regular periods     Review of Systems Michelle Arnold Arnold Michelle Arnold; 01/07/2015 3:13 PM) General Not Present- Appetite Loss, Chills, Fatigue, Fever, Night Sweats, Weight Gain and Weight Loss. Skin Not Present- Change in Wart/Mole, Dryness, Hives, Jaundice, Michelle Lesions, Non-Healing Wounds, Rash and Ulcer. HEENT Not Present- Earache, Hearing Loss, Hoarseness, Nose Bleed, Oral Ulcers, Ringing in the Ears, Seasonal Allergies, Sinus Pain, Sore Throat, Visual Disturbances, Wears glasses/contact lenses and Yellow Eyes. Respiratory Not Present- Bloody sputum, Chronic Cough, Difficulty Breathing, Snoring and Wheezing. Breast Not Present- Breast Mass, Breast Pain, Nipple Discharge and Skin Changes. Cardiovascular Not Present- Chest Pain, Difficulty Breathing Lying Down, Leg Cramps, Palpitations, Rapid Heart Rate, Shortness of Breath and Swelling of Extremities. Gastrointestinal Not Present- Abdominal Pain, Bloating, Bloody Stool, Change in Bowel Habits, Chronic diarrhea, Constipation, Difficulty Swallowing, Excessive gas, Gets full quickly at meals, Hemorrhoids, Indigestion, Nausea, Rectal Pain and Vomiting. Female Genitourinary Not Present- Frequency, Nocturia, Painful Urination, Pelvic Pain and Urgency. Musculoskeletal Not Present- Back Pain, Joint Pain, Joint Stiffness, Muscle Pain, Muscle Weakness and Swelling of  Extremities. Neurological Not Present- Decreased Memory, Fainting, Headaches, Numbness, Seizures, Tingling, Tremor, Trouble walking and Weakness. Psychiatric Present- Anxiety and Depression. Not Present- Bipolar, Change in Sleep Pattern, Fearful and Frequent crying. Endocrine Not Present- Cold Intolerance, Excessive Hunger, Hair Changes, Heat  Intolerance, Hot flashes and Michelle Diabetes. Hematology Not Present- Easy Bruising, Excessive bleeding, Gland problems, HIV and Persistent Infections.  Vitals Michelle Arnold Arnold Michelle Arnold; 01/07/2015 3:15 PM) 01/07/2015 3:15 PM Weight: 128 lb Height: 64in Body Surface Area: 1.62 m Body Mass Index: 21.97 kg/m Temp.: 98.64F(Temporal)  Pulse: 86 (Regular)  BP: 122/82 (Sitting, Left Arm, Standard)     Physical Exam Michelle Sportsman MD; 01/07/2015 4:04 PM)  General Mental Status-Alert. General Appearance-Not in acute distress, Not Sickly. Orientation-Oriented X3. Hydration-Well hydrated. Voice-Normal.  Integumentary Global Assessment Upon inspection and palpation of skin surfaces of the - Axillae: non-tender, no inflammation or ulceration, no drainage. and Distribution of scalp and body hair is normal. General Characteristics Temperature - normal warmth is noted.  Head and Neck Head-normocephalic, atraumatic with no lesions or palpable masses. Face Global Assessment - atraumatic, no absence of expression. Neck Global Assessment - no abnormal movements, no bruit auscultated on the right, no bruit auscultated on the left, no decreased range of motion, non-tender. Trachea-midline. Thyroid Gland Characteristics - non-tender.  Eye Eyeball - Left-Extraocular movements intact, No Nystagmus. Eyeball - Right-Extraocular movements intact, No Nystagmus. Cornea - Left-No Hazy. Cornea - Right-No Hazy. Sclera/Conjunctiva - Left-No scleral icterus, No Discharge. Sclera/Conjunctiva - Right-No scleral icterus, No  Discharge. Pupil - Left-Direct reaction to light normal. Pupil - Right-Direct reaction to light normal.  ENMT Ears Pinna - Left - no drainage observed, no generalized tenderness observed. Right - no drainage observed, no generalized tenderness observed. Nose and Sinuses External Inspection of the Nose - no destructive lesion observed. Inspection of the nares - Left - quiet respiration. Right - quiet respiration. Mouth and Throat Lips - Upper Lip - no fissures observed, no pallor noted. Lower Lip - no fissures observed, no pallor noted. Nasopharynx - no discharge present. Oral Cavity/Oropharynx - Tongue - no dryness observed. Oral Mucosa - no cyanosis observed. Hypopharynx - no evidence of airway distress observed.  Chest and Lung Exam Inspection Movements - Normal and Symmetrical. Accessory muscles - No use of accessory muscles in breathing. Palpation Palpation of the chest reveals - Non-tender. Auscultation Breath sounds - Normal and Clear.  Cardiovascular Auscultation Rhythm - Regular. Murmurs & Other Heart Sounds - Auscultation of the heart reveals - No Murmurs and No Systolic Clicks.  Abdomen Inspection Inspection of the abdomen reveals - No Visible peristalsis and No Abnormal pulsations. Umbilicus - No Bleeding, No Urine drainage. Palpation/Percussion Palpation and Percussion of the abdomen reveal - Soft, Non Tender, No Rebound tenderness, No Rigidity (guarding) and No Cutaneous hyperesthesia. Note: Soft and flat. Mild diastases. No hernias. No guarding or tenderness.   Female Genitourinary Sexual Maturity Tanner 5 - Adult hair pattern. Note: Normal external female genitalia. Normal Internet her labia. No Bartholin's cyst.  Obvious 2 cm mass between on her LEFT labia and inner LEFT thigh in the posterior groin crease. Some scarring. Pitting and both groins and perineum but no true hidradenitis. Scarred blackheads in whiteheads. No vaginal bleeding nor  discharge   Rectal Note: Perianal skin clear. Normal sphincter tone. No fissure or fistula. No external hemorrhoids. No obvious pilonidal disease.   Peripheral Vascular Upper Extremity Inspection - Left - No Cyanotic nailbeds, Not Ischemic. Right - No Cyanotic nailbeds, Not Ischemic.  Neurologic Neurologic evaluation reveals -normal attention span and ability to concentrate, able to name objects and repeat phrases. Appropriate fund of knowledge , normal sensation and normal coordination. Mental Status Affect - not angry, not paranoid. Cranial Nerves-Normal Bilaterally. Gait-Normal.  Neuropsychiatric Mental status  exam performed with findings of-able to articulate well with normal speech/language, rate, volume and coherence, thought content normal with ability to perform basic computations and apply abstract reasoning and no evidence of hallucinations, delusions, obsessions or homicidal/suicidal ideation.  Musculoskeletal Global Assessment Spine, Ribs and Pelvis - no instability, subluxation or laxity. Right Upper Extremity - no instability, subluxation or laxity.  Lymphatic Head & Neck  General Head & Neck Lymphatics: Bilateral - Description - No Localized lymphadenopathy. Axillary  General Axillary Region: Bilateral - Description - No Localized lymphadenopathy. Femoral & Inguinal  Generalized Femoral & Inguinal Lymphatics: Left - Description - No Localized lymphadenopathy. Right - Description - No Localized lymphadenopathy.    Assessment & Plan Michelle Sportsman MD; 01/07/2015 5:30 PM)  PERINEAL MASS, FEMALE (N90.9) Impression: Chronic perineal mass in LEFT inner groin between outer labia and thigh. 2 cm. Most likely consistent with chronically inflamed cyst.  I think he would benefit from removal. Given its size sensitivity and recurrence, would do this under least deep sedation. Too sensitive & large to try and do under local in the office. She wishes to have it  removed in its been there for almost a year and it is persists in its annoyance and fluctuation in size  Current Plans You are being scheduled for surgery - Our schedulers will call you.  You should hear from our office's scheduling department within 5 working days about the location, date, and time of surgery. We try to make accommodations for patient's preferences in scheduling surgery, but sometimes the OR schedule or the surgeon's schedule prevents [place] from making those accommodations.  If you have not heard from our office [phone]) in 5 working days, call the office and ask for your surgeon's nurse.  If you have other questions about your diagnosis, plan, or surgery, call the office and ask for your surgeon's nurse. Pt Education - [facility] Pain Control (Serenidy Waltz) Pt Education - [facility] Pelvic Floor Exercises (Kegels) and Dysfunction HCI (Reana Chacko) Pt Education - [facility] Rectal Surgery HCI (Orby Tangen): discussed with patient and provided information. TOBACCO ABUSE (Z72.0)  Current Plans Pt Education - [facility] STOP SMOKING!  Michelle Arnold Arnold, M.D., F.A.C.S. Gastrointestinal and Minimally Invasive Surgery [facility] [address], [address] [place], [place] [address] [phone] Main / Paging

## 2015-03-08 DIAGNOSIS — Z79899 Other long term (current) drug therapy: Secondary | ICD-10-CM | POA: Insufficient documentation

## 2015-11-03 ENCOUNTER — Ambulatory Visit (INDEPENDENT_AMBULATORY_CARE_PROVIDER_SITE_OTHER): Payer: BLUE CROSS/BLUE SHIELD | Admitting: Family Medicine

## 2015-11-03 ENCOUNTER — Encounter: Payer: Self-pay | Admitting: Family Medicine

## 2015-11-03 VITALS — BP 90/61 | HR 73 | Temp 97.9°F | Resp 20 | Ht 64.0 in | Wt 108.5 lb

## 2015-11-03 DIAGNOSIS — F418 Other specified anxiety disorders: Secondary | ICD-10-CM

## 2015-11-03 MED ORDER — VENLAFAXINE HCL ER 75 MG PO CP24: 75.0000 mg | ORAL_CAPSULE | Freq: Every day | ORAL | 0 refills | Status: DC

## 2015-11-03 MED ORDER — VENLAFAXINE HCL ER 37.5 MG PO CP24: 37.5000 mg | ORAL_CAPSULE | Freq: Every day | ORAL | 0 refills | Status: DC

## 2015-11-03 NOTE — Progress Notes (Signed)
Patient ID: Michelle Arnold, female  DOB: Jan 11, 1966, 50 y.o.   MRN: [id number] Patient Care Team    Relationship Specialty Notifications Start End  Natalia Leatherwood, DO PCP - General Family Medicine  11/03/15     Subjective:  Michelle Arnold is a 50 y.o.  female present for new patient establishment, with complaints. Patients last PCP was with [facility], and [facility] physicians prior to them.  All past medical history, surgical history, allergies, family history, immunizations, medications and social history were updated and enetered in the electronic medical record today. All recent labs, ED visits and hospitalizations within the last year were reviewed.  Anxiety: Pt states she is feeling overwhelmed. She is presnt with her husband today that also offers history. 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 One of the adult children are also in "trouble" at the moment. They report she has been treated for many years for anxiety and depression and never she is in great control of it. She is currently not on any medications, and has not been for about a month. She has been seen by multiple PCP providers in [place] and [place] systems, as well as [place] a few years ago. She was referred to psychiatry within the last year and states she went once, and did not feel they added to her therapy. It also cost her a co-pay she was unbale to afford.  Her TSH normal 0.667 2 months ago. She is losing weight unintentional because she just does not feel like eating sometimes. She reports she even does not want to get out of bed some days. She has tried pristq (but insurance did not cover), effexor, lexapro, Wellbutrin, zoloft, celexa and prozac that she can recall. She felt the most controlled on the 150 mg Effexor dose. She denies SI or HI currently. She is not interested in seeing psychiatry.    Mood disorder screen: negative.   Depression screen [facility] 2/9 11/04/2015  Decreased Interest 3  PHQ - 2 Score 3  Altered sleeping 3  Tired, decreased energy 3  Change in appetite 3  Feeling bad or failure about yourself  3  Trouble concentrating 3  Moving slowly or fidgety/restless 0  Suicidal thoughts 2  PHQ-9 Score 20   GAD 7 : Generalized Anxiety Score 11/04/2015  Nervous, Anxious, on Edge 3  Control/stop worrying 2  Worry too much - different things 2  Trouble relaxing 2  Restless 2  Easily annoyed or irritable 2  Afraid - awful might happen 2  Total GAD 7 Score 15  Anxiety Difficulty Very difficult    Immunization History  Administered Date(s) Administered  . Influenza Split 02/04/2012  . Influenza Whole 01/04/2011  . Tdap 11/09/2011    Past Medical History:  Diagnosis Date  . Allergy   . Anxiety   . Chicken pox as a child  . CTS (carpal tunnel syndrome)    right  . Dehydration 11/09/2011  . Depression   . Depression with anxiety   . Dermatitis 11/09/2011   Right arm  . Fatigue 11/09/2011  . Folliculitis 12/16/2011  . Hidradenitis suppurativa 12/16/2011  . Hoarseness 11/09/2011  . Hyperlipidemia   . Measles as a child  . Migraine 11/09/2011  . Mumps as a child  . 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 Preventative health care 11/09/2011  . Tobacco abuse 11/09/2011   Allergies  Allergen Reactions  . Atorvastatin     Memory loss   Past Surgical History:  Procedure Laterality Date  . LEEP  2004   Family History  Problem Relation Age of Onset  . Diabetes Mother     ?  Marland Kitchen Osteoporosis Mother   . Hyperlipidemia Father   . Heart disease Father     MI in 67  . Hypertension Father   . Heart attack Father 60  . Cancer Maternal Grandmother     lung- smoker  . Osteoporosis Maternal Grandmother   . Cancer Maternal Grandfather     prostate  . Cataracts Paternal Grandmother    . Other Paternal Grandfather     PAD with gangrene   Social History   Social History  . Marital status: Married    Spouse name: N/A  . Number of children: N/A  . Years of education: N/A   Occupational History  . Not on file.   Social History Main Topics  . Smoking status: Current Every Day Smoker    Packs/day: 1.00    Years: 30.00    Types: Cigarettes  . Smokeless tobacco: Never Used  . Alcohol use Yes     Comment: very rare  . Drug use: No  . Sexual activity: Yes    Partners: Male   Other Topics Concern  . Not on file   Social History Narrative  . No narrative on file     Medication List       Accurate as of 11/03/15 11:59 PM. Always use your most recent med list.          multivitamin tablet Take 1 tablet by mouth daily.   rosuvastatin 10 MG tablet Commonly known as:  CRESTOR Take by mouth.   venlafaxine 75 MG tablet Commonly known as:  EFFEXOR Take 1 tablet (75 mg total) by mouth daily.   venlafaxine XR 37.5 MG 24 hr capsule Commonly known as:  EFFEXOR XR Take 1 capsule (37.5 mg total) by mouth daily with breakfast.   venlafaxine XR 75 MG 24 hr capsule Commonly known as:  EFFEXOR XR Take 1 capsule (75 mg total) by mouth daily with breakfast.       No results found for this or any previous visit (from the past 2160 hour(s)).  No results found.  ROS: 14 pt review of systems performed and negative (unless mentioned in an HPI)  Objective: BP 90/61 (BP Location: Left Arm, Patient Position: Sitting, Cuff Size: Normal)   Pulse 73   Temp 97.9 F (36.6 C) (Oral)   Resp 20   Ht  (1.626 m)   Wt 108 lb 8 oz (49.2 kg)   SpO2 96%   BMI 18.62 kg/m  Gen: Afebrile. No acute distress. Nontoxic in appearance, well-developed, thin caucasian female.  HENT: AT. [place]. MMM, Eyes:Pupils Equal Round Reactive to light, Extraocular movements intact,  Conjunctiva without redness, discharge or icterus. Neck/lymp/endocrine: Supple,no lymphadenopathy, no  thyromegaly CV: RRR  Chest: CTAB, no wheeze, rhonchi or crackles. Abd: Soft. NTND. BS present. Skin: Warm and well-perfused. Skin intact. Neuro/Msk: Normal gait. PERLA. EOMi. Alert. Oriented x3.   Psych: Anxious, sad, tearful. Normal  dress. Normal speech. Normal thought content and judgment.   Assessment/plan: Michelle Arnold is a 50 y.o. female present for transfer/establishment. Records in care everywhere reviewed.  Depression with anxiety -  pt declined referral to psych.  - Will restart effexor taper. Pt was discouraged against abruptly stopping medication for any reason. Discussed use for at least 6 months.  - venlafaxine XR (EFFEXOR XR) 37.5 MG 24 hr capsule; Take 1 capsule (37.5 mg total) by mouth daily with breakfast.  Dispense: 7 capsule; Refill: 0 - venlafaxine XR (EFFEXOR XR) 75 MG 24 hr capsule; Take 1 capsule (75 mg total) by mouth daily with breakfast.  Dispense: 30 capsule; Refill: 0 - F/U 4 weeks, may need to taper to 150 mg at that time. Second agent also may be needed.   Return in about 4 weeks (around 12/01/2015), or depression and anxiety.   Greater than 40 minutes spent with patient, >50% of time spent face to face counseling patient and coordinating care.  Electronically signed by: Felix Pacini, DO [facility] [place]

## 2015-11-03 NOTE — Patient Instructions (Addendum)
Start effexor 37.5 mg, this is called into the pharmacy. Then start 75 mg a day and follow up 4 weeks.

## 2015-11-04 ENCOUNTER — Encounter: Payer: Self-pay | Admitting: Family Medicine

## 2015-12-01 ENCOUNTER — Ambulatory Visit (INDEPENDENT_AMBULATORY_CARE_PROVIDER_SITE_OTHER): Payer: BLUE CROSS/BLUE SHIELD | Admitting: Family Medicine

## 2015-12-01 ENCOUNTER — Encounter: Payer: Self-pay | Admitting: Family Medicine

## 2015-12-01 DIAGNOSIS — F418 Other specified anxiety disorders: Secondary | ICD-10-CM | POA: Diagnosis not present

## 2015-12-01 MED ORDER — VENLAFAXINE HCL ER 75 MG PO CP24: 75.0000 mg | ORAL_CAPSULE | Freq: Every day | ORAL | 0 refills | Status: DC

## 2015-12-01 MED ORDER — MIRTAZAPINE 7.5 MG PO TABS: 7.5000 mg | ORAL_TABLET | Freq: Every day | ORAL | 1 refills | Status: DC

## 2015-12-01 NOTE — Progress Notes (Signed)
Patient ID: Michelle Arnold, female  DOB: 1965-05-02, 50 y.o.   MRN: [id number] Patient Care Team    Relationship Specialty Notifications Start End  Natalia Leatherwood, DO PCP - General Family Medicine  11/03/15     Subjective:  Michelle Arnold is a 50 y.o.  female present for follow up on anxiety.   Anxiety: Depression with anxiety: Pt is tolerating the 75 mg dose of Effexor. She is feeling much improved, but still having some difficulties. She is still having problems with appetite because of her anxiety. She states her husband and her mother also fell she is improving. She has not lost any additional weight since last visit.   Prior note:  Pt states she is feeling overwhelmed. She is presnt with her husband today that also offers history. 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 One of the adult children are also in "trouble" at the moment. They report she has been treated for many years for anxiety and depression and never she is in great control of it. She is currently not on any medications, and has not been for about a month. She has been seen by multiple PCP providers in [place] and [place] systems, as well as [place] a few years ago. She was referred to psychiatry within the last year and states she went once, and did not feel they added to her therapy. It also cost her a co-pay she was unbale to afford.  Her TSH normal 0.667 2 months ago. She is losing weight unintentional because she just does not feel like eating sometimes. She reports she even does not want to get out of bed some days. She has tried pristq (but insurance did not cover), effexor, lexapro, Wellbutrin, zoloft, celexa and prozac that she can recall. She felt the most controlled on the 150 mg Effexor dose. She denies SI or HI currently. She is not interested in seeing psychiatry.   Mood disorder screen:  negative.   Depression screen [facility] 2/9 11/04/2015  Decreased Interest 3  PHQ - 2 Score 3  Altered sleeping 3  Tired, decreased energy 3  Change in appetite 3  Feeling bad or failure about yourself  3  Trouble concentrating 3  Moving slowly or fidgety/restless 0  Suicidal thoughts 2  PHQ-9 Score 20   GAD 7 : Generalized Anxiety Score 11/04/2015  Nervous, Anxious, on Edge 3  Control/stop worrying 2  Worry too much - different things 2  Trouble relaxing 2  Restless 2  Easily annoyed or irritable 2  Afraid - awful might happen 2  Total GAD 7 Score 15  Anxiety Difficulty Very difficult    Immunization History  Administered Date(s) Administered  . Influenza Split 02/04/2012  . Influenza Whole 01/04/2011  . Tdap 11/09/2011    Past Medical History:  Diagnosis Date  . Allergy   . Chicken pox as a child  . CTS (carpal tunnel syndrome)    right  . Depression with anxiety   . Dermatitis 11/09/2011   Right arm  . Fatigue 11/09/2011  . Hidradenitis suppurativa 12/16/2011  . Hyperlipidemia   . Measles as a child  . Migraine 11/09/2011  . Mumps as a child  . 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  Perimenopausal   . Tobacco abuse 11/09/2011  . Vitamin D deficiency    Allergies  Allergen Reactions  . Atorvastatin     Memory loss   Past Surgical History:  Procedure Laterality Date  . LEEP  2004   Family History  Problem Relation Age of Onset  . Diabetes Mother     ?  Marland Kitchen Osteoporosis Mother   . Hyperlipidemia Father   . Heart disease Father     MI in 50  . Hypertension Father   . Heart attack Father 67  . Cancer Maternal Grandmother     lung- smoker  . Osteoporosis Maternal Grandmother   . Cancer Maternal Grandfather     prostate  . Cataracts Paternal Grandmother   . Other Paternal Grandfather     PAD with gangrene   Social History   Social History  . Marital status: Married     Spouse name: N/A  . Number of children: N/A  . Years of education: N/A   Occupational History  . self employed     Social History Main Topics  . Smoking status: Current Every Day Smoker    Packs/day: 1.00    Years: 30.00    Types: Cigarettes  . Smokeless tobacco: Never Used  . Alcohol use Yes     Comment: very rare  . Drug use: No  . Sexual activity: Yes    Partners: Male     Comment: Married   Other Topics Concern  . Not on file   Social History Narrative   Married. Has children.    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    Everyday smoker. Occasional alcohol. No drugs.      Medication List       Accurate as of 12/01/15  3:13 PM. Always use your most recent med list.          multivitamin tablet Take 1 tablet by mouth daily.   rosuvastatin 10 MG tablet Commonly known as:  CRESTOR Take by mouth.   venlafaxine XR 75 MG 24 hr capsule Commonly known as:  EFFEXOR XR Take 1 capsule (75 mg total) by mouth daily with breakfast.       No results found for this or any previous visit (from the past 2160 hour(s)).  No results found.  ROS: 14 pt review of systems performed and negative (unless mentioned in an HPI)  Objective: BP 102/68 (BP Location: Left Arm, Patient Position: Sitting, Cuff Size: Normal)   Pulse 86   Temp 98.3 F (36.8 C)   Resp 20   Ht 5\' 4"  (1.626 m)   Wt 108 lb 8 oz (49.2 kg)   SpO2 99%   BMI 18.62 kg/m  Gen: Afebrile. No acute distress. Nontoxic in appearance, well-developed, thin caucasian female.  HENT: AT. [place]. MMM, Eyes:Pupils Equal Round Reactive to light, Extraocular movements intact,  Conjunctiva without redness, discharge or icterus. Neck/lymp/endocrine: Supple,no lymphadenopathy, no thyromegaly CV: RRR  Chest: CTAB, no wheeze, rhonchi or crackles. Abd: Soft. NTND. BS present. Skin: Warm and well-perfused. Skin intact. Neuro/Msk: Normal gait. PERLA. EOMi. Alert. Oriented x3.   Psych: Mildly Anxious. Normal  dress. Normal speech. Normal  thought content and judgment.  Assessment/plan: Michelle Arnold is a 50 y.o. female present for transfer/establishment. Records in care everywhere reviewed.  Depression with anxiety - doing better on effexor 75 mg. Still having some difficulties, especially surrounding eating. Will continue 75 mg dose for now, since there is improvement and starting Remeron added.  -  pt declined referral to psych.  - remeron low dose started, help with appetite.  - venlafaxine XR (EFFEXOR XR) 75 MG 24 hr capsule; Take 1 capsule (75 mg total) by mouth daily with breakfast.  Dispense: 30 capsule; Refill: 0 - Remeron 7.5 mg start QHS - F/U 4 weeks, may need to taper to 150 mg at that time.   > 25 minutes spent with patient, >50% of time spent face to face counseling patient and coordinating care.  Electronically signed by: Felix Pacinienee Jaxston Chohan, DO [facility] [place]

## 2015-12-01 NOTE — Patient Instructions (Signed)
Continue Effexor 75 mg, follow up in 4 weeks. If needed can increase at that time.  Start Remeron 1 hour before bed daily.

## 2015-12-29 ENCOUNTER — Encounter: Payer: Self-pay | Admitting: Family Medicine

## 2015-12-29 ENCOUNTER — Ambulatory Visit (INDEPENDENT_AMBULATORY_CARE_PROVIDER_SITE_OTHER): Payer: BLUE CROSS/BLUE SHIELD | Admitting: Family Medicine

## 2015-12-29 VITALS — BP 114/75 | HR 84 | Temp 97.9°F | Resp 18 | Ht 64.0 in | Wt 105.5 lb

## 2015-12-29 DIAGNOSIS — R634 Abnormal weight loss: Secondary | ICD-10-CM | POA: Diagnosis not present

## 2015-12-29 DIAGNOSIS — F418 Other specified anxiety disorders: Secondary | ICD-10-CM

## 2015-12-29 DIAGNOSIS — Z23 Encounter for immunization: Secondary | ICD-10-CM | POA: Diagnosis not present

## 2015-12-29 MED ORDER — ROSUVASTATIN CALCIUM 10 MG PO TABS: 10.0000 mg | ORAL_TABLET | Freq: Every day | ORAL | 3 refills | Status: DC

## 2015-12-29 MED ORDER — VENLAFAXINE HCL ER 150 MG PO CP24: 150.0000 mg | ORAL_CAPSULE | Freq: Every day | ORAL | 5 refills | Status: DC

## 2015-12-29 NOTE — Progress Notes (Signed)
Patient ID: Michelle Arnold Rape, female  DOB: 1966/01/18, 50 y.o.   MRN: [id number] Patient Care Team    Relationship Specialty Notifications Start End  Natalia Leatherwoodenee A Kuneff, DO PCP - General Family Medicine  11/03/15     Subjective:  Michelle Arnold Blades is a 50 y.o.  female present for follow up on anxiety.   Anxiety: Pt presents for follow up on her anxiety. She is tolerating the effexor 75 mg. She did not tolerate the remeron, she feels it made her anxiety worse. She stopped the remeron and symptoms resolved. She lost another 3 lbs. She does feel she could use more anxiety control and would like to increase the effexor dose.   Prior note:  Depression with anxiety: Pt is tolerating the 75 mg dose of Effexor. She is feeling much improved, but still having some difficulties. She is still having problems with appetite because of her anxiety. She states her husband and her mother also fell she is improving. She has not lost any additional weight since last visit.   Prior note:  Pt states she is feeling overwhelmed. She is presnt with her husband today that also offers history. 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 One of the adult children are also in "trouble" at the moment. They report she has been treated for many years for anxiety and depression and never she is in great control of it. She is currently not on any medications, and has not been for about a month. She has been seen by multiple PCP providers in [place] [facility], as well as [place] a few years ago. She was referred to psychiatry within the last year and states she went once, and did not feel they added to her therapy. It also cost her a co-pay she was unbale to afford.  Her TSH normal 0.667 2 months ago. She is losing weight unintentional because she just does not feel like eating sometimes. She reports she even  does not want to get out of bed some days. She has tried pristq (but insurance did not cover), effexor, lexapro, Wellbutrin, zoloft, celexa and prozac that she can recall. She felt the most controlled on the 150 mg Effexor dose. She denies SI or HI currently. She is not interested in seeing psychiatry.   Mood disorder screen: negative.   [facility] 11[facility]04/2015  Decreased Interest 3  PHQ - 2 Score 3  Altered sleeping 3  Tired, decreased energy 3  Change in appetite 3  Feeling bad or failure about yourself  3  Trouble concentrating 3  Moving slowly or fidgety/restless 0  Suicidal thoughts 2  PHQ-9 Score 20   GAD 7 : Generalized Anxiety Score 11/04/2015  Nervous, Anxious, on Edge 3  Control/stop worrying 2  Worry too much - different things 2  Trouble relaxing 2  Restless 2  Easily annoyed or irritable 2  Afraid - awful might happen 2  Total GAD 7 Score 15  Anxiety Difficulty Very difficult    Immunization History  Administered Date(s) Administered  . Influenza Split 02/04/2012  . Influenza Whole 01/04/2011  . Tdap 11/09/2011    Past Medical History:  Diagnosis Date  . Allergy   . Chicken pox as a child  . CTS (carpal tunnel syndrome)    right  . Depression with  anxiety   . Dermatitis 11/09/2011   Right arm  . Fatigue 11/09/2011  . Hidradenitis suppurativa 12/16/2011  . Hyperlipidemia   . Measles as a child  . Migraine 11/09/2011  . Mumps as a child  . 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 Perimenopausal   . Tobacco abuse 11/09/2011  . Vitamin D deficiency    Allergies  Allergen Reactions  . Atorvastatin     Memory loss   Past Surgical History:  Procedure Laterality Date  . LEEP  2004   Family History  Problem Relation Age of Onset  . Diabetes Mother     ?  Marland Kitchen Osteoporosis Mother   . Hyperlipidemia Father   . Heart disease Father     MI in 40  . Hypertension  Father   . Heart attack Father 51  . Cancer Maternal Grandmother     lung- smoker  . Osteoporosis Maternal Grandmother   . Cancer Maternal Grandfather     prostate  . Cataracts Paternal Grandmother   . Other Paternal Grandfather     PAD with gangrene   Social History   Social History  . Marital status: Married    Spouse name: N/A  . Number of children: N/A  . Years of education: N/A   Occupational History  . self employed     Social History Main Topics  . Smoking status: Current Every Day Smoker    Packs/day: 1.00    Years: 30.00    Types: Cigarettes  . Smokeless tobacco: Never Used  . Alcohol use Yes     Comment: very rare  . Drug use: No  . Sexual activity: Yes    Partners: Male     Comment: Married   Other Topics Concern  . Not on file   Social History Narrative   Married. Has children.    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    Everyday smoker. Occasional alcohol. No drugs.      Medication List       Accurate as of 12/29/15  3:15 PM. Always use your most recent med list.          mirtazapine 7.5 MG tablet Commonly known as:  REMERON Take 1 tablet (7.5 mg total) by mouth at bedtime.   multivitamin tablet Take 1 tablet by mouth daily.   rosuvastatin 10 MG tablet Commonly known as:  CRESTOR Take by mouth.   venlafaxine XR 75 MG 24 hr capsule Commonly known as:  EFFEXOR XR Take 1 capsule (75 mg total) by mouth daily with breakfast.       No results found for this or any previous visit (from the past 2160 hour(s)).  No results found.  ROS: 14 pt review of systems performed and negative (unless mentioned in an HPI)  Objective: BP 114/75 (BP Location: Right Arm, Patient Position: Sitting, Cuff Size: Normal)   Pulse 84   Temp 97.9 F (36.6 C)   Resp 18   Ht 5\' 4"  (1.626 Arnold)   Wt 105 lb 8 oz (47.9 kg)   SpO2 98%   BMI 18.11 kg/Arnold  Gen: Afebrile. No acute distress. Nontoxic in appearance, well-developed, thin caucasian female.  HENT: AT. [place].  MMM, Eyes:Pupils Equal Round Reactive to light, Extraocular movements intact,  Conjunctiva without redness, discharge or icterus. Neuro/Msk: Normal gait. PERLA. EOMi. Alert. Oriented x3.   Psych: Mildly Anxious. Normal  dress. Normal speech.  Normal thought content and judgment.  Assessment/plan: LIANNA SITZMANN is a 50 y.o. female present for transfer/establishment. Records in care everywhere reviewed.  Depression with anxiety/weight loss - doing better on effexor 75 mg. Still having some difficulties, especially surrounding eating. Will continue 75 mg dose for now, since there is improvement and starting Remeron added.  - pt declined referral to psych.  - increase to effexor 150 mg Qd - start ensure/boost between meals x2 a day.  - F/U 4 weeks if not better controlled. Could consider adding paxil or seroquel.   Flu shot given today.   > 25 minutes spent with patient, >50% of time spent face to face counseling patient and coordinating care.  Electronically signed by: Felix Pacini, DO  [facility] [place]

## 2015-12-29 NOTE — Patient Instructions (Addendum)
Start effexor 150 mg daily. Follow up in 4 weeks if not as controlled as desired.  If feeling well in 4 weeks follow up in 3 months.   Start Ensure or boost supplement two times a day, in addition to 3 meals.

## 2015-12-29 NOTE — Addendum Note (Signed)
Addended by: Thomasena EdisWILLIAMS, Richardine Peppers N on: 12/29/2015 03:43 PM   Modules accepted: Orders

## 2016-03-15 ENCOUNTER — Ambulatory Visit (INDEPENDENT_AMBULATORY_CARE_PROVIDER_SITE_OTHER): Payer: BLUE CROSS/BLUE SHIELD | Admitting: Family Medicine

## 2016-03-15 ENCOUNTER — Encounter: Payer: Self-pay | Admitting: Family Medicine

## 2016-03-15 VITALS — BP 104/69 | HR 83 | Temp 98.1°F | Resp 20 | Ht 64.0 in | Wt 108.2 lb

## 2016-03-15 DIAGNOSIS — F418 Other specified anxiety disorders: Secondary | ICD-10-CM | POA: Diagnosis not present

## 2016-03-15 MED ORDER — VENLAFAXINE HCL ER 150 MG PO CP24: 150.0000 mg | ORAL_CAPSULE | Freq: Every day | ORAL | 0 refills | Status: DC

## 2016-03-15 NOTE — Patient Instructions (Signed)
I have refilled your medication for 3 months.  Follow before needing refills.  If you want to try to decrease dose next visit we can discuss    I hope you have a great Holiday!!!!

## 2016-03-15 NOTE — Progress Notes (Signed)
Patient ID: Michelle Arnold, female  DOB: 1965/07/28, 50 y.o.   MRN: [id number] Patient Care Team    Relationship Specialty Notifications Start End  Natalia Leatherwood, DO PCP - General Family Medicine  11/03/15     Subjective:  Michelle Arnold is a 50 y.o.  female present for follow up on anxiety.   Anxiety: Pt presents for follow up on her anxiety. She is tolerating the effexor 150 mg. She did not tolerate the remeron, she feels it made her anxiety worse. She stopped the remeron and symptoms resolved. She gained 3 lbs on her own! She reports she is feeling well and would like to continue current dose of effexor. She is thinking about eventually cutting back to a lower dose eventually, if able.   Prior note:  Depression with anxiety: Pt is tolerating the 75 mg dose of Effexor. She is feeling much improved, but still having some difficulties. She is still having problems with appetite because of her anxiety. She states her husband and her mother also fell she is improving. She has not lost any additional weight since last visit.   Prior note:  Pt states she is feeling overwhelmed. She is presnt with her husband today that also offers history. 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 One of the adult children are also in "trouble" at the moment. They report she has been treated for many years for anxiety and depression and never she is in great control of it. She is currently not on any medications, and has not been for about a month. She has been seen by multiple PCP providers in [place] and [place] [facility], as well as  a few years ago. She was referred to psychiatry within the last year and states she went once, and did not feel they added to her therapy. It also cost her a co-pay she was unbale to afford.  Her TSH normal 0.667 2 months ago. She is losing weight  unintentional because she just does not feel like eating sometimes. She reports she even does not want to get out of bed some days. She has tried pristq (but insurance did not cover), effexor, lexapro, Wellbutrin, zoloft, celexa and prozac that she can recall. She felt the most controlled on the 150 mg Effexor dose. She denies SI or HI currently. She is not interested in seeing psychiatry.   Mood disorder screen: negative.   Depression screen [facility] 2/9 11/04/2015  Decreased Interest 3  PHQ - 2 Score 3  Altered sleeping 3  Tired, decreased energy 3  Change in appetite 3  Feeling bad or failure about yourself  3  Trouble concentrating 3  Moving slowly or fidgety/restless 0  Suicidal thoughts 2  PHQ-9 Score 20   GAD 7 : Generalized Anxiety Score 11/04/2015  Nervous, Anxious, on Edge 3  Control/stop worrying 2  Worry too much - different things 2  Trouble relaxing 2  Restless 2  Easily annoyed or irritable 2  Afraid - awful might happen 2  Total GAD 7 Score 15  Anxiety Difficulty Very difficult    Immunization History  Administered Date(s) Administered  . Influenza Split 02/04/2012  . Influenza Whole 01/04/2011  . Influenza,inj,Quad PF,36+ Mos 12/29/2015  . Tdap 11/09/2011    Past Medical History:  Diagnosis Date  . Allergy   .  Chicken pox as a child  . CTS (carpal tunnel syndrome)    right  . Depression with anxiety   . Dermatitis 11/09/2011   Right arm  . Fatigue 11/09/2011  . Hidradenitis suppurativa 12/16/2011  . Hyperlipidemia   . Measles as a child  . Migraine 11/09/2011  . Mumps as a child  . 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 Perimenopausal   . Tobacco abuse 11/09/2011  . Vitamin D deficiency    Allergies  Allergen Reactions  . Atorvastatin     Memory loss   Past Surgical History:  Procedure Laterality Date  . LEEP  2004   Family History  Problem Relation Age of Onset    . Diabetes Mother     ?  Marland Kitchen. Osteoporosis Mother   . Hyperlipidemia Father   . Heart disease Father     MI in [id number]  . Hypertension Father   . Heart attack Father [phone]  . Cancer Maternal Grandmother     lung- smoker  . Osteoporosis Maternal Grandmother   . Cancer Maternal Grandfather     prostate  . Cataracts Paternal Grandmother   . Other Paternal Grandfather     PAD with gangrene   Social History   Social History  . Marital status: Married    Spouse name: N/A  . Number of children: N/A  . Years of education: N/A   Occupational History  . self employed     Social History Main Topics  . Smoking status: Current Every Day Smoker    Packs/day: 1.00    Years: 30.00    Types: Cigarettes  . Smokeless tobacco: Never Used  . Alcohol use Yes     Comment: very rare  . Drug use: No  . Sexual activity: Yes    Partners: Male     Comment: Married   Other Topics Concern  . Not on file   Social History Narrative   Married. Has children.    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    Everyday smoker. Occasional alcohol. No drugs.      Medication List       Accurate as of 03/15/16  3:19 PM. Always use your most recent med list.          Cetirizine HCl 10 MG Caps Take by mouth.   multivitamin tablet Take 1 tablet by mouth daily.   rosuvastatin 10 MG tablet Commonly known as:  CRESTOR Take 1 tablet (10 mg total) by mouth daily.   venlafaxine XR 150 MG 24 hr capsule Commonly known as:  EFFEXOR XR Take 1 capsule (150 mg total) by mouth daily with breakfast.       No results found for this or any previous visit (from the past 2160 hour(s)).  No results found.  ROS: 14 pt review of systems performed and negative (unless mentioned in an HPI)  Objective: BP 104/69 (BP Location: Left Arm, Patient Position: Sitting, Cuff Size: Normal)   Pulse 83   Temp 98.1 F (36.7 C)   Resp 20   Ht 5\' 4"  (1.626 m)   Wt 108 lb 4 oz (49.1 kg)   SpO2 99%   BMI 18.58 kg/m  Gen: Afebrile. No  acute distress.  HENT: AT. [place].MMM.  Eyes:Pupils Equal Round Reactive to light, Extraocular movements intact,  Conjunctiva without redness, discharge or icterus. CV: RRR  Neuro: Normal gait. PERLA. EOMi. Alert. Oriented.  Psych: Normal affect, dress and demeanor. Normal speech. Normal thought content and judgment..   Assessment/plan: Michelle Arnold is a 50 y.o. female present for transfer/establishment. Records in care everywhere reviewed.  Depression with anxiety/weight loss  - pt declined referral to psych.  - Continue effexor 150 mg Qd, 90 d refill.  - could consider lower dose on next appt, however I would taper very slowly if she does decide to cut back on med. Could consider 75+37.5 mg - Continue ensure/boost between meals x2 a day.  - F/U 3 months    Electronically signed by: Felix Pacinienee Alesha Jaffee, DO [facility] [place]

## 2016-04-27 ENCOUNTER — Encounter: Payer: Self-pay | Admitting: Family Medicine

## 2016-04-27 ENCOUNTER — Ambulatory Visit (INDEPENDENT_AMBULATORY_CARE_PROVIDER_SITE_OTHER): Payer: BLUE CROSS/BLUE SHIELD | Admitting: Family Medicine

## 2016-04-27 VITALS — BP 98/65 | HR 72 | Temp 98.3°F | Resp 18 | Wt 105.5 lb

## 2016-04-27 DIAGNOSIS — R350 Frequency of micturition: Secondary | ICD-10-CM

## 2016-04-27 DIAGNOSIS — N390 Urinary tract infection, site not specified: Secondary | ICD-10-CM | POA: Diagnosis not present

## 2016-04-27 DIAGNOSIS — R319 Hematuria, unspecified: Secondary | ICD-10-CM

## 2016-04-27 LAB — POC URINALSYSI DIPSTICK (AUTOMATED)
Bilirubin, UA: NEGATIVE
Glucose, UA: NEGATIVE
Ketones, UA: NEGATIVE
Nitrite, UA: NEGATIVE
SPEC GRAV UA: 1.01
UROBILINOGEN UA: 0.2
pH, UA: 6

## 2016-04-27 MED ORDER — CEPHALEXIN 500 MG PO CAPS: 500.0000 mg | ORAL_CAPSULE | Freq: Four times a day (QID) | ORAL | 0 refills | Status: DC

## 2016-04-27 NOTE — Progress Notes (Signed)
Michelle Arnold , January 02, 1966, 51 y.o., female MRN: [id number] Patient Care Team    Relationship Specialty Notifications Start End  Natalia Leatherwoodenee A Quaran Kedzierski, DO PCP - General Family Medicine  11/03/15     CC: UTI-sx Subjective: Pt presents for an acute OV with complaints of urinary frequency of 2 days duration.  Associated symptoms include hematuria and dysuria. She has had a few UTI over the years and this feels the same to her. Prior UTI, E.coli- amp and bactrim resistant. She denies nausea, vomit, abd pain or low back pain.   Allergies  Allergen Reactions  . Atorvastatin     Memory loss   Social History  Substance Use Topics  . Smoking status: Current Every Day Smoker    Packs/day: 1.00    Years: 30.00    Types: Cigarettes  . Smokeless tobacco: Never Used  . Alcohol use Yes     Comment: very rare   Past Medical History:  Diagnosis Date  . Allergy   . Chicken pox as a child  . CTS (carpal tunnel syndrome)    right  . Depression with anxiety   . Dermatitis 11/09/2011   Right arm  . Fatigue 11/09/2011  . Hidradenitis suppurativa 12/16/2011  . Hyperlipidemia   . Measles as a child  . Migraine 11/09/2011  . Mumps as a child  . 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 Perimenopausal   . Tobacco abuse 11/09/2011  . Vitamin D deficiency    Past Surgical History:  Procedure Laterality Date  . LEEP  2004   Family History  Problem Relation Age of Onset  . Diabetes Mother     ?  Marland Kitchen. Osteoporosis Mother   . Hyperlipidemia Father   . Heart disease Father     MI in [id number]  . Hypertension Father   . Heart attack Father [id number]  . Cancer Maternal Grandmother     lung- smoker  . Osteoporosis Maternal Grandmother   . Cancer Maternal Grandfather     prostate  . Cataracts Paternal Grandmother   . Other Paternal Grandfather     PAD with gangrene   Allergies as of 04/27/2016      Reactions   Atorvastatin    Memory loss      Medication List       Accurate as of 04/27/16 11:23 AM. Always use your most recent med list.          Cetirizine HCl 10 MG Caps Take by mouth.   multivitamin tablet Take 1 tablet by mouth daily.   rosuvastatin 10 MG tablet Commonly known as:  CRESTOR Take 1 tablet (10 mg total) by mouth daily.   venlafaxine XR 150 MG 24 hr capsule Commonly known as:  EFFEXOR XR Take 1 capsule (150 mg total) by mouth daily with breakfast.       Results for orders placed or performed in visit on 04/27/16 (from the past 24 hour(s))  POCT Urinalysis Dipstick (Automated)     Status: Abnormal   Collection Time: 04/27/16 11:11 AM  Result Value Ref Range   Color, UA yellow    Clarity, UA cloudy    Glucose, UA negative    Bilirubin, UA negative    Ketones, UA negative    Spec Grav, UA 1.010    Blood, UA large    pH, UA 6.0    Protein, UA 30mg   Urobilinogen, UA 0.2    Nitrite, UA negative    Leukocytes, UA large (3+) (A) Negative   No results found.   ROS: Negative, with the exception of above mentioned in HPI   Objective:  BP 98/65 (BP Location: Left Arm, Patient Position: Sitting, Cuff Size: Normal)   Pulse 72   Temp 98.3 F (36.8 C)   Resp 18   Wt 105 lb 8 oz (47.9 kg)   SpO2 97%   BMI 18.11 kg/m  Body mass index is 18.11 kg/m. Gen: Afebrile. No acute distress. Nontoxic in appearance, well developed, well nourished.  HENT: AT. [place].  MMM CV: RRR  Chest: CTAB, no wheeze or crackles.  Abd: Soft. Mild suprapubic pressure, ND. BS present Skin: no rashes, purpura or petechiae.  Neuro: Normal gait. PERLA. EOMi. Alert. Oriented x3   Results for orders placed or performed in visit on 04/27/16 (from the past 24 hour(s))  POCT Urinalysis Dipstick (Automated)     Status: Abnormal   Collection Time: 04/27/16 11:11 AM  Result Value Ref Range   Color, UA yellow    Clarity, UA cloudy    Glucose, UA negative    Bilirubin, UA negative    Ketones, UA negative     Spec Grav, UA 1.010    Blood, UA large    pH, UA 6.0    Protein, UA 30mg     Urobilinogen, UA 0.2    Nitrite, UA negative    Leukocytes, UA large (3+) (A) Negative    Assessment/Plan: Michelle Arnold is a 51 y.o. female present for acute OV for  Urinary frequency/Urinary tract infection with hematuria, site unspecified - POCT Urinalysis Dipstick (Automated) - Urine Culture - Kelfex prescribed QID for 7 days. Rest, plenty of hydration.  - pt will be called with urine culture results and abx management if needed.  - F/u PRN  electronically signed by:  Felix Pacini, DO  [facility] [place]

## 2016-04-27 NOTE — Patient Instructions (Signed)
Start keflex every 6 hours for 7 days.  Plenty of water/cranberry juice etc.

## 2016-04-30 ENCOUNTER — Telehealth: Payer: Self-pay | Admitting: Family Medicine

## 2016-04-30 LAB — URINE CULTURE: Colony Count: >=100000

## 2016-04-30 MED ORDER — NITROFURANTOIN MONOHYD MACRO 100 MG PO CAPS: 100.0000 mg | ORAL_CAPSULE | Freq: Two times a day (BID) | ORAL | 0 refills | Status: DC

## 2016-04-30 NOTE — Telephone Encounter (Signed)
Please call pt: - her urine cultured a bacteria not susceptible to the medication she was prescribed. We do have to change her medication. I have called in a new medication for her to take for seven days.

## 2016-04-30 NOTE — Telephone Encounter (Signed)
Detailed message left on patients voice mail, per DPR.

## 2016-05-17 ENCOUNTER — Ambulatory Visit (INDEPENDENT_AMBULATORY_CARE_PROVIDER_SITE_OTHER): Payer: BLUE CROSS/BLUE SHIELD | Admitting: Family Medicine

## 2016-05-17 ENCOUNTER — Encounter: Payer: Self-pay | Admitting: Family Medicine

## 2016-05-17 VITALS — BP 100/68 | HR 81 | Temp 98.1°F | Resp 20 | Wt 105.0 lb

## 2016-05-17 DIAGNOSIS — R350 Frequency of micturition: Secondary | ICD-10-CM

## 2016-05-17 DIAGNOSIS — R8290 Unspecified abnormal findings in urine: Secondary | ICD-10-CM | POA: Diagnosis not present

## 2016-05-17 LAB — POC URINALSYSI DIPSTICK (AUTOMATED)
Bilirubin, UA: NEGATIVE
Glucose, UA: NEGATIVE
Ketones, UA: NEGATIVE
NITRITE UA: NEGATIVE
PH UA: 5.5
Protein, UA: NEGATIVE
SPEC GRAV UA: 1.01
UROBILINOGEN UA: 0.2

## 2016-05-17 MED ORDER — CIPROFLOXACIN HCL 500 MG PO TABS: 500.0000 mg | ORAL_TABLET | Freq: Two times a day (BID) | ORAL | 0 refills | Status: DC

## 2016-05-17 NOTE — Patient Instructions (Addendum)
Start antibiotic and take to completion. If you want the march appt to be your physical you will need to have Diane change it and make sure it is in the right slot for a physical. Also make sure you are due for a physical.

## 2016-05-17 NOTE — Progress Notes (Signed)
Michelle Arnold , 07-Mar-1966, 51 y.o., female MRN: [id number] Patient Care Team    Relationship Specialty Notifications Start End  Natalia Leatherwood, DO PCP - General Family Medicine  11/03/15     CC: UTI-sx Subjective:  Pt presents for recurrent UTI symptoms. She states 2 days ago she experienced blood in her urine and lower abdomen pain that felt like a spasm. She also endorses increase in urine odor and fatigue. She denies fever, chills, nausea, vomit, changes in BM or low back pain. She was treated for a UTI 3 weeks ago with keflex, and then switched to Macrobid after cultures received. Last E.coli UTI was resistant to ampicillin, ceph and bactrim. Pt reports she completed the entire course of second abx as prescribed. She denies h/o kidney stones. She used AZO on Saturday and it helped with her symptoms.   Prior  Note: Pt presents for an acute OV with complaints of urinary frequency of 2 days duration.  Associated symptoms include hematuria and dysuria. She has had a few UTI over the years and this feels the same to her. Prior UTI, E.coli- amp and bactrim resistant. She denies nausea, vomit, abd pain or low back pain.  No H/O of kidney stones.   Allergies  Allergen Reactions  . Atorvastatin     Memory loss   Social History  Substance Use Topics  . Smoking status: Current Every Day Smoker    Packs/day: 1.00    Years: 30.00    Types: Cigarettes  . Smokeless tobacco: Never Used  . Alcohol use Yes     Comment: very rare   Past Medical History:  Diagnosis Date  . Allergy   . Chicken pox as a child  . CTS (carpal tunnel syndrome)    right  . Depression with anxiety   . Dermatitis 11/09/2011   Right arm  . Fatigue 11/09/2011  . Hidradenitis suppurativa 12/16/2011  . Hyperlipidemia   . Measles as a child  . Migraine 11/09/2011  . Mumps as a child  . 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  Perimenopausal   . Tobacco abuse 11/09/2011  . Vitamin D deficiency    Past Surgical History:  Procedure Laterality Date  . LEEP  2004   Family History  Problem Relation Age of Onset  . Diabetes Mother     ?  Marland Kitchen Osteoporosis Mother   . Hyperlipidemia Father   . Heart disease Father     MI in 42  . Hypertension Father   . Heart attack Father 50  . Cancer Maternal Grandmother     lung- smoker  . Osteoporosis Maternal Grandmother   . Cancer Maternal Grandfather     prostate  . Cataracts Paternal Grandmother   . Other Paternal Grandfather     PAD with gangrene   Allergies as of 05/17/2016      Reactions   Atorvastatin    Memory loss      Medication List       Accurate as of 05/17/16 10:26 AM. Always use your most recent med list.          Cetirizine HCl 10 MG Caps Take by mouth.   multivitamin tablet Take 1 tablet by mouth daily.   rosuvastatin 10 MG tablet Commonly known as:  CRESTOR Take 1 tablet (10 mg total) by mouth daily.   venlafaxine XR 150 MG 24 hr  capsule Commonly known as:  EFFEXOR XR Take 1 capsule (150 mg total) by mouth daily with breakfast.       Results for orders placed or performed in visit on 05/17/16 (from the past 24 hour(s))  POCT Urinalysis Dipstick (Automated)     Status: Abnormal   Collection Time: 05/17/16 10:24 AM  Result Value Ref Range   Color, UA yellow    Clarity, UA clear    Glucose, UA Negative    Bilirubin, UA Negative    Ketones, UA Negative    Spec Grav, UA 1.010    Blood, UA Moderate    pH, UA 5.5    Protein, UA Negative    Urobilinogen, UA 0.2    Nitrite, UA Negative    Leukocytes, UA small (1+) (A) Negative   No results found.   ROS: Negative, with the exception of above mentioned in HPI   Objective:  BP 100/68 (BP Location: Left Arm, Patient Position: Sitting, Cuff Size: Normal)   Pulse 81   Temp 98.1 F (36.7 C)   Resp 20   Wt 105 lb (47.6 kg)   SpO2 98%   BMI 18.02 kg/m  Body mass index is  18.02 kg/m. Gen: Afebrile. No acute distress. Appears tired.  HENT: AT. [place].MMM.  Eyes:Pupils Equal Round Reactive to light, Extraocular movements intact,  Conjunctiva without redness, discharge or icterus. CV: RRR  Abd: Soft. flat. TTP suprapubic area. ND. BS present. no Masses palpated.  MSK: no CVA tenderness bilaterally.  Neuro:  Normal gait. PERLA. EOMi. Alert. Oriented.  Results for orders placed or performed in visit on 05/17/16 (from the past 24 hour(s))  POCT Urinalysis Dipstick (Automated)     Status: Abnormal   Collection Time: 05/17/16 10:24 AM  Result Value Ref Range   Color, UA yellow    Clarity, UA clear    Glucose, UA Negative    Bilirubin, UA Negative    Ketones, UA Negative    Spec Grav, UA 1.010    Blood, UA Moderate    pH, UA 5.5    Protein, UA Negative    Urobilinogen, UA 0.2    Nitrite, UA Negative    Leukocytes, UA small (1+) (A) Negative    Assessment/Plan: Michelle Arnold is a 51 y.o. female present for acute OV for  Urinary frequency/Urinary tract infection with hematuria, site unspecified - POCT Urinalysis Dipstick (Automated) - Urine Culture ordered again.  - Cipro BID for 5 days, for resistant UTI symptoms.  - F/U 1-2 weeks if not improved, consider Uro referral if reoccurs.   electronically signed by:  Felix Pacinienee Lunetta Marina, DO  [facility] [place]

## 2016-05-19 ENCOUNTER — Telehealth: Payer: Self-pay | Admitting: Family Medicine

## 2016-05-19 LAB — URINE CULTURE

## 2016-05-19 NOTE — Telephone Encounter (Signed)
Please call Michelle Arnold. Her urine did result with a mild UTI (not as bad as prior) and the medication we used will clear her infection.

## 2016-05-19 NOTE — Telephone Encounter (Signed)
Left detailed message on voice mail. Okay per DPR.

## 2016-06-14 ENCOUNTER — Ambulatory Visit: Payer: BLUE CROSS/BLUE SHIELD | Admitting: Family Medicine

## 2016-06-18 ENCOUNTER — Ambulatory Visit (INDEPENDENT_AMBULATORY_CARE_PROVIDER_SITE_OTHER): Payer: BLUE CROSS/BLUE SHIELD | Admitting: Family Medicine

## 2016-06-18 ENCOUNTER — Encounter: Payer: Self-pay | Admitting: Family Medicine

## 2016-06-18 VITALS — BP 112/71 | HR 66 | Temp 98.0°F | Resp 20 | Ht 64.0 in | Wt 109.5 lb

## 2016-06-18 DIAGNOSIS — Z23 Encounter for immunization: Secondary | ICD-10-CM

## 2016-06-18 DIAGNOSIS — Z1159 Encounter for screening for other viral diseases: Secondary | ICD-10-CM

## 2016-06-18 DIAGNOSIS — E2839 Other primary ovarian failure: Secondary | ICD-10-CM

## 2016-06-18 DIAGNOSIS — Z1231 Encounter for screening mammogram for malignant neoplasm of breast: Secondary | ICD-10-CM | POA: Diagnosis not present

## 2016-06-18 DIAGNOSIS — Z114 Encounter for screening for human immunodeficiency virus [HIV]: Secondary | ICD-10-CM | POA: Diagnosis not present

## 2016-06-18 DIAGNOSIS — Z1329 Encounter for screening for other suspected endocrine disorder: Secondary | ICD-10-CM | POA: Diagnosis not present

## 2016-06-18 DIAGNOSIS — Z1321 Encounter for screening for nutritional disorder: Secondary | ICD-10-CM

## 2016-06-18 DIAGNOSIS — Z720 Tobacco use: Secondary | ICD-10-CM | POA: Diagnosis not present

## 2016-06-18 DIAGNOSIS — Z131 Encounter for screening for diabetes mellitus: Secondary | ICD-10-CM

## 2016-06-18 DIAGNOSIS — Z0000 Encounter for general adult medical examination without abnormal findings: Secondary | ICD-10-CM | POA: Diagnosis not present

## 2016-06-18 DIAGNOSIS — Z130 Encounter for screening for diseases of the blood and blood-forming organs and certain disorders involving the immune mechanism: Secondary | ICD-10-CM | POA: Diagnosis not present

## 2016-06-18 DIAGNOSIS — Z13220 Encounter for screening for lipoid disorders: Secondary | ICD-10-CM

## 2016-06-18 DIAGNOSIS — Z1239 Encounter for other screening for malignant neoplasm of breast: Secondary | ICD-10-CM

## 2016-06-18 LAB — CBC WITH DIFFERENTIAL/PLATELET
BASOS PCT: 0.4 % (ref 0.0–3.0)
Basophils Absolute: 0 10*3/uL (ref 0.0–0.1)
Eosinophils Absolute: 0.1 10*3/uL (ref 0.0–0.7)
Eosinophils Relative: 1.2 % (ref 0.0–5.0)
HCT: 40.3 % (ref 36.0–46.0)
Hemoglobin: 13.6 g/dL (ref 12.0–15.0)
LYMPHS ABS: 3.4 10*3/uL (ref 0.7–4.0)
LYMPHS PCT: 37.8 % (ref 12.0–46.0)
MCHC: 33.8 g/dL (ref 30.0–36.0)
MCV: 92.7 fl (ref 78.0–100.0)
MONOS PCT: 7.2 % (ref 3.0–12.0)
Monocytes Absolute: 0.6 10*3/uL (ref 0.1–1.0)
NEUTROS ABS: 4.7 10*3/uL (ref 1.4–7.7)
NEUTROS PCT: 53.4 % (ref 43.0–77.0)
PLATELETS: 288 10*3/uL (ref 150.0–400.0)
RBC: 4.35 Mil/uL (ref 3.87–5.11)
RDW: 12.6 % (ref 11.5–15.5)
WBC: 8.9 10*3/uL (ref 4.0–10.5)

## 2016-06-18 LAB — LIPID PANEL
CHOLESTEROL: 158 mg/dL (ref 0–200)
HDL: 70.6 mg/dL (ref >39.00)
LDL Cholesterol: 76 mg/dL (ref 0–99)
NonHDL: 87.18
TRIGLYCERIDES: 56 mg/dL (ref 0.0–149.0)
Total CHOL/HDL Ratio: 2
VLDL: 11.2 mg/dL (ref 0.0–40.0)

## 2016-06-18 LAB — COMPREHENSIVE METABOLIC PANEL
ALBUMIN: 4.4 g/dL (ref 3.5–5.2)
ALT: 24 U/L (ref 0–35)
AST: 25 U/L (ref 0–37)
Alkaline Phosphatase: 62 U/L (ref 39–117)
BUN: 10 mg/dL (ref 6–23)
CALCIUM: 9.6 mg/dL (ref 8.4–10.5)
CHLORIDE: 102 meq/L (ref 96–112)
CO2: 29 mEq/L (ref 19–32)
Creatinine, Ser: 0.6 mg/dL (ref 0.40–1.20)
GFR: 111.27 mL/min (ref >60.00)
Glucose, Bld: 78 mg/dL (ref 70–99)
POTASSIUM: 5 meq/L (ref 3.5–5.1)
Sodium: 134 mEq/L — ABNORMAL LOW (ref 135–145)
Total Bilirubin: 0.3 mg/dL (ref 0.2–1.2)
Total Protein: 7.2 g/dL (ref 6.0–8.3)

## 2016-06-18 LAB — TSH: TSH: 0.79 u[IU]/mL (ref 0.35–4.50)

## 2016-06-18 LAB — VITAMIN D 25 HYDROXY (VIT D DEFICIENCY, FRACTURES): VITD: 38.78 ng/mL (ref 30.00–100.00)

## 2016-06-18 LAB — HEMOGLOBIN A1C: Hgb A1c MFr Bld: 5.9 % (ref 4.6–6.5)

## 2016-06-18 NOTE — Progress Notes (Signed)
Patient ID: Michelle Arnold, female  DOB: 1966-02-14, 51 y.o.   MRN: [id number] Patient Care Team    Relationship Specialty Notifications Start End  Ma Hillock, DO PCP - General Family Medicine  11/03/15     Subjective:  Michelle Arnold is a 51 y.o.  Female  present for CPE. All past medical history, surgical history, allergies, family history, immunizations, medications and social history were updated in the electronic medical record today. All recent labs, ED visits and hospitalizations within the last year were reviewed.  Health maintenance:  Colonoscopy: completed 11/29/2013, by Dr. Clydene Laming, resutls "normal" . follow up ?Marland Kitchen Mammogram: completed:2013, "normal per pt" Cervical cancer screening: last pap: 12/2014, results: normal, - HPV, completed by: gyn. Due 12/2017 Immunizations: tdap 2013 UTD, Influenza UTD 2017 (encouraged yearly), PNA series started today with prevnar 13 (smoking). Infectious disease screening: HIV and Hep C screenings  DEXA: estrogen deficient, smoker, thin caucasian female.  Assistive device: None Oxygen IYM:EBRA Patient has a Dental home. Hospitalizations/ED visits: None  Depression screen [facility] 2/9 06/18/2016 11/04/2015  Decreased Interest 0 3  Down, Depressed, Hopeless 0 -  PHQ - 2 Score 0 3  Altered sleeping - 3  Tired, decreased energy - 3  Change in appetite - 3  Feeling bad or failure about yourself  - 3  Trouble concentrating - 3  Moving slowly or fidgety/restless - 0  Suicidal thoughts - 2  PHQ-9 Score - 20    Current Exercise Habits: The patient does not participate in regular exercise at present Exercise limited by: None identified   Immunization History  Administered Date(s) Administered  . Influenza Split 02/04/2012  . Influenza Whole 01/04/2011  . Influenza,inj,Quad PF,36+ Mos 12/29/2015  . Tdap 11/09/2011     Past Medical History:  Diagnosis Date  . Allergy   . Chicken pox as a child  . CTS (carpal tunnel syndrome)    right  .  Depression with anxiety   . Dermatitis 11/09/2011   Right arm  . Fatigue 11/09/2011  . Hidradenitis suppurativa 12/16/2011  . Hyperlipidemia   . Measles as a child  . Migraine 11/09/2011  . Mumps as a child  . 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 Perimenopausal   . Tobacco abuse 11/09/2011  . Vitamin D deficiency    Allergies  Allergen Reactions  . Atorvastatin     Memory loss   Past Surgical History:  Procedure Laterality Date  . LEEP  2004   Family History  Problem Relation Age of Onset  . Diabetes Mother     ?  Marland Kitchen Osteoporosis Mother   . Hyperlipidemia Father   . Heart disease Father     MI in 19  . Hypertension Father   . Heart attack Father 30  . Cancer Maternal Grandmother     lung- smoker  . Osteoporosis Maternal Grandmother   . Cancer Maternal Grandfather     prostate  . Cataracts Paternal Grandmother   . Other Paternal Grandfather     PAD with gangrene   Social History   Social History  . Marital status: Married    Spouse name: N/A  . Number of children: N/A  . Years of education: N/A   Occupational History  . self employed     Social History Main Topics  . Smoking status: Current Every Day Smoker    Packs/day: 1.00  Years: 30.00    Types: Cigarettes  . Smokeless tobacco: Never Used  . Alcohol use Yes     Comment: very rare  . Drug use: No  . Sexual activity: Yes    Partners: Male     Comment: Married   Other Topics Concern  . Not on file   Social History Narrative   Married. Has children.    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    Everyday smoker. Occasional alcohol. No drugs.    Allergies as of 06/18/2016      Reactions   Atorvastatin    Memory loss      Medication List       Accurate as of 06/18/16 12:42 PM. Always use your most recent med list.          Cetirizine HCl 10 MG Caps Take by mouth.   multivitamin tablet Take 1 tablet by mouth  daily.   rosuvastatin 10 MG tablet Commonly known as:  CRESTOR Take 1 tablet (10 mg total) by mouth daily.   venlafaxine XR 150 MG 24 hr capsule Commonly known as:  EFFEXOR XR Take 1 capsule (150 mg total) by mouth daily with breakfast.       No results found.   ROS: 14 pt review of systems performed and negative (unless mentioned in an HPI)  Objective: BP 112/71 (BP Location: Right Arm, Patient Position: Sitting, Cuff Size: Normal)   Pulse 66   Temp 98 F (36.7 C)   Resp 20   Ht _0  (1.626 m)   Wt 109 lb 8 oz (49.7 kg)   SpO2 100%   BMI 18.80 kg/m  Gen: Afebrile. No acute distress. Nontoxic in appearance, well-developed, well-nourished,  Thin caucasian female. Pleasant. Appears really well today.  HENT: AT. [place]. Bilateral TM visualized and normal in appearance, normal external auditory canal. MMM, no oral lesions, adequate dentition. Bilateral nares within normal limits. Throat without erythema, ulcerations or exudates. no Cough on exam, no hoarseness on exam. Eyes:Pupils Equal Round Reactive to light, Extraocular movements intact,  Conjunctiva without redness, discharge or icterus. Neck/lymp/endocrine: Supple,no lymphadenopathy, no thyromegaly CV: RRR no murmur, no edema, +2/4 P posterior tibialis pulses. no carotid bruits. No JVD. Chest: CTAB, no wheeze, rhonchi or crackles. normal Respiratory effort. good Air movement. Abd: Soft. Flat.NTND. BS present. no Masses palpated. No hepatosplenomegaly. No rebound tenderness or guarding. Skin: no rashes, purpura or petechiae. Warm and well-perfused. Skin intact. Neuro/Msk: Normal gait. PERLA. EOMi. Alert. Oriented x3.  Cranial nerves II through XII intact. Muscle strength 5/5 upper/lower extremity. DTRs equal bilaterally. Psych: Normal affect, dress and demeanor. Normal speech. Normal thought content and judgment.  Assessment/plan: Michelle Arnold is a 51 y.o. female present for CPE. Screening for iron deficiency anemia - CBC  w/Diff Routine health maintenance - Comp Met (CMET)  Lipid screening/hyperlipidemia - Lipid panel - she has started to increase her fiber.  Screening for thyroid disorder - TSH Encounter for vitamin deficiency screening - she just started 1000u vit d - Vitamin D (25 hydroxy) Screening for diabetes mellitus - HgB A1c Encounter for screening for HIV - HIV antibody (with reflex) - pt agreeable to testing Encounter for hepatitis C screening test for low risk patient - Hepatitis C Antibody - pt agreeable to testing Tobacco abuse/estrogen deficiency  - she is not ready to quit. - DG Bone Density; Future Breast cancer screening No fhx, no abnl prior - MM DIGITAL SCREENING BILATERAL; Future Encounter for preventive health examination Patient was  encouraged to exercise greater than 150 minutes a week. Patient was encouraged to choose a diet filled with fresh fruits and vegetables, and lean meats. AVS provided to patient today for education/recommendation on gender specific health and safety maintenance.  Return for CPE.1 year  Electronically signed by: Howard Pouch, DO [place]

## 2016-06-18 NOTE — Addendum Note (Signed)
Addended by: Thomasena EdisWILLIAMS, Janathan Bribiesca N on: 06/18/2016 12:46 PM   Modules accepted: Orders

## 2016-06-18 NOTE — Patient Instructions (Signed)
Health Maintenance, Female Adopting a healthy lifestyle and getting preventive care can go a long way to promote health and wellness. Talk with your health care provider about what schedule of regular examinations is right for you. This is a good chance for you to check in with your provider about disease prevention and staying healthy. In between checkups, there are plenty of things you can do on your own. Experts have done a lot of research about which lifestyle changes and preventive measures are most likely to keep you healthy. Ask your health care provider for more information. Weight and diet Eat a healthy diet  Be sure to include plenty of vegetables, fruits, low-fat dairy products, and lean protein.  Do not eat a lot of foods high in solid fats, added sugars, or salt.  Get regular exercise. This is one of the most important things you can do for your health.  Most adults should exercise for at least 150 minutes each week. The exercise should increase your heart rate and make you sweat (moderate-intensity exercise).  Most adults should also do strengthening exercises at least twice a week. This is in addition to the moderate-intensity exercise. Maintain a healthy weight  Body mass index (BMI) is a measurement that can be used to identify possible weight problems. It estimates body fat based on height and weight. Your health care provider can help determine your BMI and help you achieve or maintain a healthy weight.  For females 89 years of age and older:  A BMI below 18.5 is considered underweight.  A BMI of 18.5 to 24.9 is normal.  A BMI of 25 to 29.9 is considered overweight.  A BMI of 30 and above is considered obese. Watch levels of cholesterol and blood lipids  You should start having your blood tested for lipids and cholesterol at 53 years of age, then have this test every 5 years.  You may need to have your cholesterol levels checked more often if:  Your lipid or  cholesterol levels are high.  You are older than 53 years of age.  You are at high risk for heart disease. Cancer screening Lung Cancer  Lung cancer screening is recommended for adults 7-31 years old who are at high risk for lung cancer because of a history of smoking.  A yearly low-dose CT scan of the lungs is recommended for people who:  Currently smoke.  Have quit within the past 15 years.  Have at least a 30-pack-year history of smoking. A pack year is smoking an average of one pack of cigarettes a day for 1 year.  Yearly screening should continue until it has been 15 years since you quit.  Yearly screening should stop if you develop a health problem that would prevent you from having lung cancer treatment. Breast Cancer  Practice breast self-awareness. This means understanding how your breasts normally appear and feel.  It also means doing regular breast self-exams. Let your health care provider know about any changes, no matter how small.  If you are in your 20s or 30s, you should have a clinical breast exam (CBE) by a health care provider every 1-3 years as part of a regular health exam.  If you are 29 or older, have a CBE every year. Also consider having a breast X-ray (mammogram) every year.  If you have a family history of breast cancer, talk to your health care provider about genetic screening.  If you are at high risk for breast cancer, talk  to your health care provider about having an MRI and a mammogram every year.  Breast cancer gene (BRCA) assessment is recommended for women who have family members with BRCA-related cancers. BRCA-related cancers include:  Breast.  Ovarian.  Tubal.  Peritoneal cancers.  Results of the assessment will determine the need for genetic counseling and BRCA1 and BRCA2 testing. Cervical Cancer  Your health care provider may recommend that you be screened regularly for cancer of the pelvic organs (ovaries, uterus, and vagina).  This screening involves a pelvic examination, including checking for microscopic changes to the surface of your cervix (Pap test). You may be encouraged to have this screening done every 3 years, beginning at age 24.  For women ages 66-65, health care providers may recommend pelvic exams and Pap testing every 3 years, or they may recommend the Pap and pelvic exam, combined with testing for human papilloma virus (HPV), every 5 years. Some types of HPV increase your risk of cervical cancer. Testing for HPV may also be done on women of any age with unclear Pap test results.  Other health care providers may not recommend any screening for nonpregnant women who are considered low risk for pelvic cancer and who do not have symptoms. Ask your health care provider if a screening pelvic exam is right for you.  If you have had past treatment for cervical cancer or a condition that could lead to cancer, you need Pap tests and screening for cancer for at least 20 years after your treatment. If Pap tests have been discontinued, your risk factors (such as having a new sexual partner) need to be reassessed to determine if screening should resume. Some women have medical problems that increase the chance of getting cervical cancer. In these cases, your health care provider may recommend more frequent screening and Pap tests. Colorectal Cancer  This type of cancer can be detected and often prevented.  Routine colorectal cancer screening usually begins at 53 years of age and continues through 53 years of age.  Your health care provider may recommend screening at an earlier age if you have risk factors for colon cancer.  Your health care provider may also recommend using home test kits to check for hidden blood in the stool.  A small camera at the end of a tube can be used to examine your colon directly (sigmoidoscopy or colonoscopy). This is done to check for the earliest forms of colorectal cancer.  Routine  screening usually begins at age 41.  Direct examination of the colon should be repeated every 5-10 years through 53 years of age. However, you may need to be screened more often if early forms of precancerous polyps or small growths are found. Skin Cancer  Check your skin from head to toe regularly.  Tell your health care provider about any new moles or changes in moles, especially if there is a change in a mole's shape or color.  Also tell your health care provider if you have a mole that is larger than the size of a pencil eraser.  Always use sunscreen. Apply sunscreen liberally and repeatedly throughout the day.  Protect yourself by wearing long sleeves, pants, a wide-brimmed hat, and sunglasses whenever you are outside. Heart disease, diabetes, and high blood pressure  High blood pressure causes heart disease and increases the risk of stroke. High blood pressure is more likely to develop in:  People who have blood pressure in the high end of the normal range (130-139/85-89 mm Hg).  People who are overweight or obese.  People who are African American.  If you are 49-53 years of age, have your blood pressure checked every 3-5 years. If you are 51 years of age or older, have your blood pressure checked every year. You should have your blood pressure measured twice-once when you are at a hospital or clinic, and once when you are not at a hospital or clinic. Record the average of the two measurements. To check your blood pressure when you are not at a hospital or clinic, you can use:  An automated blood pressure machine at a pharmacy.  A home blood pressure monitor.  If you are between 94 years and 18 years old, ask your health care provider if you should take aspirin to prevent strokes.  Have regular diabetes screenings. This involves taking a blood sample to check your fasting blood sugar level.  If you are at a normal weight and have a low risk for diabetes, have this test once  every three years after 53 years of age.  If you are overweight and have a high risk for diabetes, consider being tested at a younger age or more often. Preventing infection Hepatitis B  If you have a higher risk for hepatitis B, you should be screened for this virus. You are considered at high risk for hepatitis B if:  You were born in a country where hepatitis B is common. Ask your health care provider which countries are considered high risk.  Your parents were born in a high-risk country, and you have not been immunized against hepatitis B (hepatitis B vaccine).  You have HIV or AIDS.  You use needles to inject street drugs.  You live with someone who has hepatitis B.  You have had sex with someone who has hepatitis B.  You get hemodialysis treatment.  You take certain medicines for conditions, including cancer, organ transplantation, and autoimmune conditions. Hepatitis C  Blood testing is recommended for:  Everyone born from [birth year] through 1967.  Anyone with known risk factors for hepatitis C. Sexually transmitted infections (STIs)  You should be screened for sexually transmitted infections (STIs) including gonorrhea and chlamydia if:  You are sexually active and are younger than 53 years of age.  You are older than 53 years of age and your health care provider tells you that you are at risk for this type of infection.  Your sexual activity has changed since you were last screened and you are at an increased risk for chlamydia or gonorrhea. Ask your health care provider if you are at risk.  If you do not have HIV, but are at risk, it may be recommended that you take a prescription medicine daily to prevent HIV infection. This is called pre-exposure prophylaxis (PrEP). You are considered at risk if:  You are sexually active and do not regularly use condoms or know the HIV status of your partner(s).  You take drugs by injection.  You are sexually active with a partner  who has HIV. Talk with your health care provider about whether you are at high risk of being infected with HIV. If you choose to begin PrEP, you should first be tested for HIV. You should then be tested every 3 months for as long as you are taking PrEP. Pregnancy  If you are premenopausal and you may become pregnant, ask your health care provider about preconception counseling.  If you may become pregnant, take 400 to 800 micrograms (mcg) of folic acid  every day.  If you want to prevent pregnancy, talk to your health care provider about birth control (contraception). Osteoporosis and menopause  Osteoporosis is a disease in which the bones lose minerals and strength with aging. This can result in serious bone fractures. Your risk for osteoporosis can be identified using a bone density scan.  If you are 20 years of age or older, or if you are at risk for osteoporosis and fractures, ask your health care provider if you should be screened.  Ask your health care provider whether you should take a calcium or vitamin D supplement to lower your risk for osteoporosis.  Menopause may have certain physical symptoms and risks.  Hormone replacement therapy may reduce some of these symptoms and risks. Talk to your health care provider about whether hormone replacement therapy is right for you. Follow these instructions at home:  Schedule regular health, dental, and eye exams.  Stay current with your immunizations.  Do not use any tobacco products including cigarettes, chewing tobacco, or electronic cigarettes.  If you are pregnant, do not drink alcohol.  If you are breastfeeding, limit how much and how often you drink alcohol.  Limit alcohol intake to no more than 1 drink per day for nonpregnant women. One drink equals 12 ounces of beer, 5 ounces of wine, or 1 ounces of hard liquor.  Do not use street drugs.  Do not share needles.  Ask your health care provider for help if you need support  or information about quitting drugs.  Tell your health care provider if you often feel depressed.  Tell your health care provider if you have ever been abused or do not feel safe at home. This information is not intended to replace advice given to you by your health care provider. Make sure you discuss any questions you have with your health care provider. Document Released: 10/05/2010 Document Revised: 08/28/2015 Document Reviewed: 12/24/2014 Elsevier Interactive Patient Education  2017 [employer].

## 2016-06-19 LAB — HEPATITIS C ANTIBODY: HCV Ab: NEGATIVE

## 2016-06-20 LAB — HIV ANTIBODY (ROUTINE TESTING W REFLEX): HIV 1&2 Ab, 4th Generation: NONREACTIVE

## 2016-06-21 ENCOUNTER — Telehealth: Payer: Self-pay | Admitting: Family Medicine

## 2016-06-21 MED ORDER — VENLAFAXINE HCL ER 150 MG PO CP24: 150.0000 mg | ORAL_CAPSULE | Freq: Every day | ORAL | 0 refills | Status: DC

## 2016-06-21 NOTE — Telephone Encounter (Signed)
Patient needs a refill on venlafaxine XR (EFFEXOR XR) 150 MG 24 hr capsule and   Cetirizine HCl 10 MG CAPS   [facility] [id number] - [place], [place] - [address] AT [facility] [address] [phone] (Phone) [phone] (Fax)   Please call patient once rx refill has been placed.

## 2016-06-21 NOTE — Telephone Encounter (Signed)
Message left for patient to return call.

## 2016-06-21 NOTE — Telephone Encounter (Signed)
Patient notified and verbalized understanding. Copy of labs were given to patient per her request.

## 2016-06-21 NOTE — Telephone Encounter (Signed)
Please call pt: - all of her labs look great.  

## 2016-07-23 ENCOUNTER — Ambulatory Visit: Payer: BLUE CROSS/BLUE SHIELD

## 2016-07-23 ENCOUNTER — Inpatient Hospital Stay: Admission: RE | Admit: 2016-07-23 | Payer: BLUE CROSS/BLUE SHIELD | Source: Ambulatory Visit

## 2016-08-04 ENCOUNTER — Ambulatory Visit
Admission: RE | Admit: 2016-08-04 | Discharge: 2016-08-04 | Disposition: A | Discharge status: Home / Self Care | Payer: BLUE CROSS/BLUE SHIELD | Source: Ambulatory Visit | Attending: Family Medicine | Admitting: Family Medicine

## 2016-08-04 ENCOUNTER — Telehealth: Payer: Self-pay | Admitting: Family Medicine

## 2016-08-04 ENCOUNTER — Encounter: Payer: Self-pay | Admitting: Family Medicine

## 2016-08-04 DIAGNOSIS — M8580 Other specified disorders of bone density and structure, unspecified site: Secondary | ICD-10-CM

## 2016-08-04 DIAGNOSIS — E2839 Other primary ovarian failure: Secondary | ICD-10-CM

## 2016-08-04 DIAGNOSIS — R928 Other abnormal and inconclusive findings on diagnostic imaging of breast: Secondary | ICD-10-CM | POA: Insufficient documentation

## 2016-08-04 DIAGNOSIS — Z720 Tobacco use: Secondary | ICD-10-CM

## 2016-08-04 DIAGNOSIS — M810 Age-related osteoporosis without current pathological fracture: Secondary | ICD-10-CM | POA: Insufficient documentation

## 2016-08-04 DIAGNOSIS — Z1239 Encounter for other screening for malignant neoplasm of breast: Secondary | ICD-10-CM

## 2016-08-04 NOTE — Telephone Encounter (Signed)
Please call pt: - her bone density resulted with severe osteopenia (bone softening). She is almost osteoporotic.  - There are prescribed medication that can be taken to help prevent progression. (such as fosamax). - She should also make sure to have adequate calcium 1200 mg and Vit D 1000u supplement daily. Weight bearing exercise is also helpful.  - if she would like to discuss potential prescribed medications please have her schedule an appt to review.

## 2016-08-04 NOTE — Telephone Encounter (Signed)
noted 

## 2016-08-04 NOTE — Telephone Encounter (Signed)
Spoke with patient reviewed information and results. Patient states she will add calcium and recommended Vit D and start weight bearing exercises . She does not want to start prescription medication at this time.

## 2016-08-05 ENCOUNTER — Other Ambulatory Visit: Payer: Self-pay | Admitting: Family Medicine

## 2016-08-05 DIAGNOSIS — R928 Other abnormal and inconclusive findings on diagnostic imaging of breast: Secondary | ICD-10-CM

## 2016-08-18 ENCOUNTER — Inpatient Hospital Stay: Admission: RE | Admit: 2016-08-18 | Payer: BLUE CROSS/BLUE SHIELD | Source: Ambulatory Visit

## 2016-08-27 ENCOUNTER — Ambulatory Visit
Admission: RE | Admit: 2016-08-27 | Discharge: 2016-08-27 | Disposition: A | Discharge status: Home / Self Care | Payer: BLUE CROSS/BLUE SHIELD | Source: Ambulatory Visit | Attending: Family Medicine | Admitting: Family Medicine

## 2016-08-27 DIAGNOSIS — R928 Other abnormal and inconclusive findings on diagnostic imaging of breast: Secondary | ICD-10-CM

## 2016-09-20 ENCOUNTER — Other Ambulatory Visit: Payer: Self-pay | Admitting: Family Medicine

## 2016-09-20 MED ORDER — VENLAFAXINE HCL ER 150 MG PO CP24: 150.0000 mg | ORAL_CAPSULE | Freq: Every day | ORAL | 0 refills | Status: DC

## 2016-09-20 NOTE — Telephone Encounter (Signed)
Patient states she is havig trouble getting this Rx filled by contacting her [facility] pharmacy.  Patient requesting venlafaxine XR to be refilled.

## 2016-10-18 ENCOUNTER — Telehealth: Payer: Self-pay | Admitting: *Deleted

## 2016-10-18 ENCOUNTER — Telehealth: Payer: Self-pay | Admitting: Family Medicine

## 2016-10-18 MED ORDER — VENLAFAXINE HCL ER 150 MG PO CP24: 150.0000 mg | ORAL_CAPSULE | Freq: Every day | ORAL | 0 refills | Status: DC

## 2016-10-18 NOTE — Telephone Encounter (Signed)
Refill for 14 day supply of effexor  xr sent to patient pharmacy to get her through until her appt 10/26/16

## 2016-10-18 NOTE — Telephone Encounter (Signed)
Patient left message requesting refill on her effexor she is due for follow up appt . Left message for patient letting her know she needs to be seen to get refills.

## 2016-10-18 NOTE — Telephone Encounter (Signed)
Left message for patient

## 2016-10-18 NOTE — Telephone Encounter (Signed)
Patient requesting CB. She has made an appointment for a refill however she will run out of the medication before she can come in. 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

## 2016-10-26 ENCOUNTER — Encounter: Payer: Self-pay | Admitting: Family Medicine

## 2016-10-26 ENCOUNTER — Ambulatory Visit (INDEPENDENT_AMBULATORY_CARE_PROVIDER_SITE_OTHER): Payer: BLUE CROSS/BLUE SHIELD | Admitting: Family Medicine

## 2016-10-26 VITALS — BP 106/71 | HR 86 | Temp 98.1°F | Resp 20 | Ht 64.0 in | Wt 121.0 lb

## 2016-10-26 DIAGNOSIS — F418 Other specified anxiety disorders: Secondary | ICD-10-CM

## 2016-10-26 MED ORDER — VENLAFAXINE HCL ER 150 MG PO CP24: 150.0000 mg | ORAL_CAPSULE | Freq: Every day | ORAL | 1 refills | Status: DC

## 2016-10-26 NOTE — Patient Instructions (Signed)
I am glad you are doing so well.  Continue effexor daily and follow in 6 months.    Please help us help you:  We are [facility]. Below you will find basic instructions that you may need to access in the future. Please help us help you by reading the instructions, which cover many of the frequent questions we experience.   Prescription refills and request:  -In order to allow more efficient response time, please call your pharmacy for all refills. They will forward the request electronically to us. This allows for the quickest possible response. Request left on a nurse line can take longer to refill, since these are checked as time allows between office patients and other phone calls.  - refill request can take up to 3-5 working days to complete.  - If request is sent electronically and request is appropiate, it is usually completed in 1-2 business days.  - all patients will need to be seen routinely for all chronic medical conditions requiring prescription medications (see follow-up below). If you are overdue for follow up on your condition, you will be asked to make an appointment and we will call in enough medication to cover you until your appointment (up to 30 days).  - all controlled substances will require a face to face visit to request/refill.  - if you desire your prescriptions to go through a new pharmacy, and have an active script at original pharmacy, you will need to call your pharmacy and have scripts transferred to new pharmacy. This is completed between the pharmacy locations and not by your provider.    Results: If any images or labs were ordered, it can take up to 1 week to get results depending on the test ordered and the lab/facility running and resulting the test. - Normal or stable results, which do not need further discussion, may be released to your mychart immediately with attached note to you. A call may not be  generated for normal results. Please make certain to sign up for mychart. If you have questions on how to activate your mychart you can call the front office.  - If your results need further discussion, our office will attempt to contact you via phone, and if unable to reach you after 2 attempts, we will release your abnormal result to your mychart with instructions.  - All results will be automatically released in mychart after 1 week.  - Your provider will provide you with explanation and instruction on all relevant material in your results. Please keep in mind, results and labs may appear confusing or abnormal to the untrained eye, but it does not mean they are actually abnormal for you personally. If you have any questions about your results that are not covered, or you desire more detailed explanation than what was provided, you should make an appointment with your provider to do so.   Our office handles many outgoing and incoming calls daily. If we have not contacted you within 1 week about your results, please check your mychart to see if there is a message first and if not, then contact our office.  In helping with this matter, you help decrease call volume, and therefore allow us to be able to respond to patients needs more efficiently.   Acute office visits (sick visit):  An acute visit is intended for a new problem and are scheduled in shorter time slots to allow schedule openings for  patients with new problems. This is the appropriate visit to discuss a new problem. In order to provide you with excellent quality medical care with proper time for you to explain your problem, have an exam and receive treatment with instructions, these appointments should be limited to one new problem per visit. If you experience a new problem, in which you desire to be addressed, please make an acute office visit, we save openings on the schedule to accommodate you. Please do not save your new problem for any other  type of visit, let us take care of it properly and quickly for you.   Follow up visits:  Depending on your condition(s) your provider will need to see you routinely in order to provide you with quality care and prescribe medication(s). Most chronic conditions (Example: hypertension, Diabetes, depression/anxiety... etc), require visits a couple times a year. Your provider will instruct you on proper follow up for your personal medical conditions and history. Please make certain to make follow up appointments for your condition as instructed. Failing to do so could result in lapse in your medication treatment/refills. If you request a refill, and are overdue to be seen on a condition, we will always provide you with a 30 day script (once) to allow you time to schedule.    Medicare wellness (well visit): - we have a wonderful Nurse Maudie Mercury), that will meet with you and provide you will yearly medicare wellness visits. These visits should occur yearly (can not be scheduled less than 1 calendar year apart) and cover preventive health, immunizations, advance directives and screenings you are entitled to yearly through your medicare benefits. Do not miss out on your entitled benefits, this is when medicare will pay for these benefits to be ordered for you.  These are strongly encouraged by your provider and is the appropriate type of visit to make certain you are up to date with all preventive health benefits. If you have not had your medicare wellness exam in the last 12 months, please make certain to schedule one by calling the office and schedule your medicare wellness with Maudie Mercury as soon as possible.   Yearly physical (well visit):  - Adults are recommended to be seen yearly for physicals. Check with your insurance and date of your last physical, most insurances require one calendar year between physicals. Physicals include all preventive health topics, screenings, medical exam and labs that are appropriate for  gender/age and history. You may have fasting labs needed at this visit. This is a well visit (not a sick visit), new problems should not be covered during this visit (see acute visit).  - Pediatric patients are seen more frequently when they are younger. Your provider will advise you on well child visit timing that is appropriate for your their age. - This is not a medicare wellness visit. Medicare wellness exams do not have an exam portion to the visit. Some medicare companies allow for a physical, some do not allow a yearly physical. If your medicare allows a yearly physical you can schedule the medicare wellness with our nurse Maudie Mercury and have your physical with your provider after, on the same day. Please check with insurance for your full benefits.   Late Policy/No Shows:  - all new patients should arrive 15-30 minutes earlier than appointment to allow [place] time  to  obtain all personal demographics,  insurance information and for you to complete office paperwork. - All established patients should arrive 10-15 minutes earlier than appointment time  to update all information and be checked in .  - In our best efforts to run on time, if you are late for your appointment you will be asked to either reschedule or if able, we will work you back into the schedule. There will be a wait time to work you back in the schedule,  depending on availability.  - If you are unable to make it to your appointment as scheduled, please call 24 hours ahead of time to allow [place] to fill the time slot with someone else who needs to be seen. If you do not cancel your appointment ahead of time, you may be charged a no show fee.

## 2016-10-26 NOTE — Progress Notes (Signed)
Patient ID: Michelle Arnold, female  DOB: [birth year], 51 y.o.   MRN: [id number] Patient Care Team    Relationship Specialty Notifications Start End  Natalia LeatherwoodKuneff, Renee A, DO PCP - General Family Medicine  11/03/15     Subjective:  Michelle Arnold is a 51 y.o.  female present for follow up on anxiety.   Anxiety: Pt reports she is doing really well on effexor 150 mg QD. She is feeling great. She is able to gain some weight. She has no negative side effects. Her quality of life has greatly improved.  Mood disorder screen: negative.   [facility] 10/26/2016 06/18/2016 11/04/2015  Decreased Interest 0 0 3  Down, Depressed, Hopeless 0 0 -  PHQ - 2 Score 0 0 3  Altered sleeping 0 - 3  Tired, decreased energy 0 - 3  Change in appetite 0 - 3  Feeling bad or failure about yourself  0 - 3  Trouble concentrating 0 - 3  Moving slowly or fidgety/restless 0 - 0  Suicidal thoughts 0 - 2  PHQ-9 Score 0 - 20   GAD 7 : Generalized Anxiety Score 11/04/2015  Nervous, Anxious, on Edge 3  Control/stop worrying 2  Worry too much - different things 2  Trouble relaxing 2  Restless 2  Easily annoyed or irritable 2  Afraid - awful might happen 2  Total GAD 7 Score 15  Anxiety Difficulty Very difficult    Immunization History  Administered Date(s) Administered  . Influenza Split 02/04/2012  . Influenza Whole 01/04/2011  . Influenza,inj,Quad PF,36+ Mos 12/29/2015  . Pneumococcal Conjugate-13 06/18/2016  . Tdap 11/09/2011    Past Medical History:  Diagnosis Date  . Allergy   . Chicken pox as a child  . CTS (carpal tunnel syndrome)    right  . Depression with anxiety   . Dermatitis 11/09/2011   Right arm  . Fatigue 11/09/2011  . Hidradenitis suppurativa 12/16/2011  . Hyperlipidemia   . Measles as a child  . Migraine 11/09/2011  . Mumps as a child  . 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  Perimenopausal   . Tobacco abuse 11/09/2011  . Vitamin D deficiency    Allergies  Allergen Reactions  . Atorvastatin     Memory loss   Past Surgical History:  Procedure Laterality Date  . LEEP  2004   Family History  Problem Relation Age of Onset  . Diabetes Mother        ?  Marland Kitchen. Osteoporosis Mother   . Hyperlipidemia Father   . Heart disease Father        MI in [id number]  . Hypertension Father   . Heart attack Father 1960  . Cancer Maternal Grandmother        lung- smoker  . Osteoporosis Maternal Grandmother   . Cancer Maternal Grandfather        prostate  . Cataracts Paternal Grandmother   . Other Paternal Grandfather        PAD with gangrene  . Breast cancer Maternal Aunt    Social History   Social History  . Marital status: Married    Spouse name: N/A  . Number of children: N/A  . Years of education: N/A   Occupational History  . self employed     Social History Main Topics  . Smoking status: Current Every Day Smoker  Packs/day: 1.00    Years: 30.00    Types: Cigarettes  . Smokeless tobacco: Never Used  . Alcohol use Yes     Comment: very rare  . Drug use: No  . Sexual activity: Yes    Partners: Male     Comment: Married   Other Topics Concern  . Not on file   Social History Narrative   Married. Has children.    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    Everyday smoker. Occasional alcohol. No drugs.    Allergies as of 10/26/2016      Reactions   Atorvastatin    Memory loss      Medication List       Accurate as of 10/26/16  1:48 PM. Always use your most recent med list.          BENEFIBER PO Take 1 scoop by mouth daily.   calcium gluconate 500 MG tablet Take 2 tablets by mouth daily.   Cetirizine HCl 10 MG Caps Take by mouth.   CoQ-10 100 MG Caps Take 2 capsules by mouth daily.   multivitamin tablet Take 1 tablet by mouth daily.   rosuvastatin 10 MG tablet Commonly known as:  CRESTOR Take 1 tablet (10 mg total) by mouth daily.   SUPER B  COMPLEX/C PO Take 2 tablets by mouth daily.   venlafaxine XR 150 MG 24 hr capsule Commonly known as:  EFFEXOR XR Take 1 capsule (150 mg total) by mouth daily with breakfast. Needs office visit prior to anymore refills.   vitamin C 1000 MG tablet Take 1,000 mg by mouth daily.   Vitamin D3 1000 units Caps Take 2 capsules by mouth daily.       No results found for this or any previous visit (from the past 2160 hour(s)).  No results found.  ROS: 14 pt review of systems performed and negative (unless mentioned in an HPI)  Objective: BP 106/71 (BP Location: Right Arm, Patient Position: Sitting, Cuff Size: Normal)   Pulse 86   Temp 98.1 F (36.7 C)   Resp 20   Ht 5\' 4"  (1.626 m)   Wt 121 lb (54.9 kg)   LMP  (LMP Unknown)   SpO2 98%   BMI 20.77 kg/m  Gen: Afebrile. No acute distress.  HENT: AT. [place].  MMM.  Eyes:Pupils Equal Round Reactive to light, Extraocular movements intact,  Conjunctiva without redness, discharge or icterus. CV: RRR, no edema, +2/4 P posterior tibialis pulses Chest: CTAB, no wheeze or crackles Psych: Normal affect, dress and demeanor. Normal speech. Normal thought content and judgment.   Assessment/plan: Michelle Arnold is a 51 y.o. female present for transfer/establishment. Records in care everywhere reviewed.  Depression with anxiety/weight loss  - She is doing great on medication - pt declined referral to psych.  - Continue effexor 150 mg Qd, 90d refill x1 - F/U 6 months    Electronically signed by: Felix Pacini, DO [facility] [place]

## 2017-01-28 ENCOUNTER — Other Ambulatory Visit: Payer: Self-pay | Admitting: *Deleted

## 2017-01-28 MED ORDER — ROSUVASTATIN CALCIUM 10 MG PO TABS
10.0000 mg | ORAL_TABLET | Freq: Every day | ORAL | 0 refills | Status: DC
Start: 2017-01-28 — End: 2017-06-03

## 2017-01-31 ENCOUNTER — Ambulatory Visit: Payer: BLUE CROSS/BLUE SHIELD

## 2017-01-31 ENCOUNTER — Other Ambulatory Visit: Payer: Self-pay | Admitting: Family Medicine

## 2017-02-02 ENCOUNTER — Ambulatory Visit (INDEPENDENT_AMBULATORY_CARE_PROVIDER_SITE_OTHER): Payer: BLUE CROSS/BLUE SHIELD

## 2017-02-02 DIAGNOSIS — Z23 Encounter for immunization: Secondary | ICD-10-CM | POA: Diagnosis not present

## 2017-04-20 ENCOUNTER — Encounter: Payer: Self-pay | Admitting: Family Medicine

## 2017-04-20 ENCOUNTER — Ambulatory Visit: Payer: BLUE CROSS/BLUE SHIELD | Admitting: Family Medicine

## 2017-04-20 VITALS — BP 112/70 | HR 89 | Temp 97.8°F | Wt 125.8 lb

## 2017-04-20 DIAGNOSIS — F418 Other specified anxiety disorders: Secondary | ICD-10-CM | POA: Diagnosis not present

## 2017-04-20 DIAGNOSIS — Z716 Tobacco abuse counseling: Secondary | ICD-10-CM | POA: Diagnosis not present

## 2017-04-20 DIAGNOSIS — Z720 Tobacco use: Secondary | ICD-10-CM

## 2017-04-20 MED ORDER — VENLAFAXINE HCL ER 150 MG PO CP24: 150.0000 mg | ORAL_CAPSULE | Freq: Every day | ORAL | 1 refills | Status: DC

## 2017-04-20 NOTE — Patient Instructions (Signed)
Take mucinex DM and start OTC antihistamine (claritin/zyrtec etc).  I have refilled your effexor today. Follow up every 6 months.   CPE due at end of March--> come fasting so we can check cholesterol., and other labs.   Tobacco: Pick a quit date, throw out all cigarettes night before (ruin them). Place a patch on the night before quit date.  Start at high dose patch--> add decrease down in strength every 1-2 weeks.   Pick up nicotine gum for severe cravings--> crack/bite on gum then move it to gum line.    Steps to Quit Smoking Smoking tobacco can be bad for your health. It can also affect almost every organ in your body. Smoking puts you and people around you at risk for many serious long-lasting (chronic) diseases. Quitting smoking is hard, but it is one of the best things that you can do for your health. It is never too late to quit. What are the benefits of quitting smoking? When you quit smoking, you lower your risk for getting serious diseases and conditions. They can include:  Lung cancer or lung disease.  Heart disease.  Stroke.  Heart attack.  Not being able to have children (infertility).  Weak bones (osteoporosis) and broken bones (fractures).  If you have coughing, wheezing, and shortness of breath, those symptoms may get better when you quit. You may also get sick less often. If you are pregnant, quitting smoking can help to lower your chances of having a baby of low birth weight. What can I do to help me quit smoking? Talk with your doctor about what can help you quit smoking. Some things you can do (strategies) include:  Quitting smoking totally, instead of slowly cutting back how much you smoke over a period of time.  Going to in-person counseling. You are more likely to quit if you go to many counseling sessions.  Using resources and support systems, such as: ? Agricultural engineernline chats with a Veterinary surgeoncounselor. ? Phone quitlines. ? Automotive engineerrinted self-help materials. ? Support groups  or group counseling. ? Text messaging programs. ? Mobile phone apps or applications.  Taking medicines. Some of these medicines may have nicotine in them. If you are pregnant or breastfeeding, do not take any medicines to quit smoking unless your doctor says it is okay. Talk with your doctor about counseling or other things that can help you.  Talk with your doctor about using more than one strategy at the same time, such as taking medicines while you are also going to in-person counseling. This can help make quitting easier. What things can I do to make it easier to quit? Quitting smoking might feel very hard at first, but there is a lot that you can do to make it easier. Take these steps:  Talk to your family and friends. Ask them to support and encourage you.  Call phone quitlines, reach out to support groups, or work with a Veterinary surgeoncounselor.  Ask people who smoke to not smoke around you.  Avoid places that make you want (trigger) to smoke, such as: ? Bars. ? Parties. ? Smoke-break areas at work.  Spend time with people who do not smoke.  Lower the stress in your life. Stress can make you want to smoke. Try these things to help your stress: ? Getting regular exercise. ? Deep-breathing exercises. ? Yoga. ? Meditating. ? Doing a body scan. To do this, close your eyes, focus on one area of your body at a time from head to toe,  and notice which parts of your body are tense. Try to relax the muscles in those areas.  Download or buy apps on your mobile phone or tablet that can help you stick to your quit plan. There are many free apps, such as QuitGuide from the [employer] developer for Disease Control and Prevention). You can find more support from smokefree.gov and other websites.  This information is not intended to replace advice given to you by your health care provider. Make sure you discuss any questions you have with your health care provider. Document Released: 01/16/2009 Document Revised:  11/18/2015 Document Reviewed: 08/06/2014 Elsevier Interactive Patient Education  2018 [employer].

## 2017-04-20 NOTE — Progress Notes (Signed)
Patient ID: Michelle Arnold, female  DOB: 12-28-1965, 52 y.o.   MRN: [id number] Patient Care Team    Relationship Specialty Notifications Start End  Natalia LeatherwoodKuneff, Renee A, DO PCP - General Family Medicine  11/03/15     Subjective:  Michelle Arnold is a 52 y.o.  female present for follow up on anxiety.   Anxiety: Pt reports she is doing great on effexor 150 mg QD. She denies side effects today and would like to continue medication.   Smoking cessation: Patient reports she smokes about 1 pack per day. She has smoked greater than 30 years. She has noticed increased mucus production and a chronic cough. She wants to stop smoking and would like assistance in doing so so that she does not progress to COPD.  Mood disorder screen: negative.   [facility] 04/20/2017 10/26/2016 06/18/2016 11/04/2015  Decreased Interest 0 0 0 3  Down, Depressed, Hopeless 0 0 0 -  PHQ - 2 Score 0 0 0 3  Altered sleeping 0 0 - 3  Tired, decreased energy 0 0 - 3  Change in appetite 0 0 - 3  Feeling bad or failure about yourself  0 0 - 3  Trouble concentrating 0 0 - 3  Moving slowly or fidgety/restless 0 0 - 0  Suicidal thoughts 0 0 - 2  PHQ-9 Score 0 0 - 20  Difficult doing work/chores Somewhat difficult - - -   GAD 7 : Generalized Anxiety Score 04/20/2017 11/04/2015  Nervous, Anxious, on Edge 3 3  Control/stop worrying 3 2  Worry too much - different things 3 2  Trouble relaxing 1 2  Restless 0 2  Easily annoyed or irritable 0 2  Afraid - awful might happen 0 2  Total GAD 7 Score 10 15  Anxiety Difficulty Not difficult at all Very difficult    Immunization History  Administered Date(s) Administered  . Influenza Split 02/04/2012  . Influenza Whole 01/04/2011  . Influenza,inj,Quad PF,6+ Mos 12/29/2015, 02/02/2017  . Pneumococcal Conjugate-13 06/18/2016  . Tdap 11/09/2011    Past Medical History:  Diagnosis Date  . Allergy   . Chicken pox as a child  . CTS (carpal tunnel syndrome)    right    . Depression with anxiety   . Dermatitis 11/09/2011   Right arm  . Fatigue 11/09/2011  . Hidradenitis suppurativa 12/16/2011  . Hyperlipidemia   . Measles as a child  . Migraine 11/09/2011  . Mumps as a child  . 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 Perimenopausal   . Tobacco abuse 11/09/2011  . Vitamin D deficiency    Allergies  Allergen Reactions  . Atorvastatin     Memory loss   Past Surgical History:  Procedure Laterality Date  . LEEP  2004   Family History  Problem Relation Age of Onset  . Diabetes Mother        ?  Marland Kitchen. Osteoporosis Mother   . Hyperlipidemia Father   . Heart disease Father        MI in 221994  . Hypertension Father   . Heart attack Father [id number]  . Cancer Maternal Grandmother        lung- smoker  . Osteoporosis Maternal Grandmother   . Cancer Maternal Grandfather        prostate  . Cataracts Paternal Grandmother   . Other Paternal Grandfather  PAD with gangrene  . Breast cancer Maternal Aunt    Social History   Socioeconomic History  . Marital status: Married    Spouse name: Not on file  . Number of children: Not on file  . Years of education: Not on file  . Highest education level: Not on file  Social Needs  . Financial resource strain: Not on file  . Food insecurity - worry: Not on file  . Food insecurity - inability: Not on file  . Transportation needs - medical: Not on file  . Transportation needs - non-medical: Not on file  Occupational History  . Occupation: self employed   Tobacco Use  . Smoking status: Current Every Day Smoker    Packs/day: 1.00    Years: 30.00    Pack years: 30.00    Types: Cigarettes  . Smokeless tobacco: Never Used  Substance and Sexual Activity  . Alcohol use: Yes    Comment: very rare  . Drug use: No  . Sexual activity: Yes    Partners: Male    Comment: Married  Other Topics Concern  . Not on file  Social History  Narrative   Married. Has children.    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    Everyday smoker. Occasional alcohol. No drugs.    Allergies as of 04/20/2017      Reactions   Atorvastatin    Memory loss      Medication List        Accurate as of 04/20/17  1:56 PM. Always use your most recent med list.          BENEFIBER PO Take 1 scoop by mouth daily.   calcium gluconate 500 MG tablet Take 2 tablets by mouth daily.   Cetirizine HCl 10 MG Caps Take by mouth.   CoQ-10 100 MG Caps Take 2 capsules by mouth daily.   multivitamin tablet Take 1 tablet by mouth daily.   rosuvastatin 10 MG tablet Commonly known as:  CRESTOR Take 1 tablet (10 mg total) by mouth daily.   SUPER B COMPLEX/C PO Take 2 tablets by mouth daily.   venlafaxine XR 150 MG 24 hr capsule Commonly known as:  EFFEXOR XR Take 1 capsule (150 mg total) by mouth daily with breakfast. Needs office visit prior to anymore refills.   vitamin C 1000 MG tablet Take 1,000 mg by mouth daily.   Vitamin D3 1000 units Caps Take 2 capsules by mouth daily.       No results found for this or any previous visit (from the past 2160 hour(s)).  No results found.  ROS: 14 pt review of systems performed and negative (unless mentioned in an HPI)  Objective: BP 112/70 (BP Location: Left Arm, Patient Position: Sitting, Cuff Size: Normal)   Pulse 89   Temp 97.8 F (36.6 C) (Oral)   Wt 125 lb 12.8 oz (57.1 kg)   LMP  (LMP Unknown)   SpO2 96%   BMI 21.59 kg/m  Gen: Afebrile. No acute distress. Nontoxic in appearance, well-developed, well-nourished, Caucasian female. HENT: AT. [place]. MMM.  Eyes:Pupils Equal Round Reactive to light, Extraocular movements intact,  Conjunctiva without redness, discharge or icterus. CV: RRR  Chest: CTAB, no wheeze or crackles Neuro:  Normal gait. PERLA. EOMi. Alert. Oriented x3  Psych: Normal affect, dress and demeanor. Normal speech. Normal thought content and judgment.    Assessment/plan: Michelle Arnold is a 52 y.o. female present for transfer/establishment. Records in care everywhere reviewed.  Depression with anxiety - Stable. Doing very well on medication.  - pt declined referral to psych.  - Continue effexor 150 mg Qd, 90d refill x1 - F/U 6 months  Smoking cessation/tobacco abuse: Patient was asked about smoking history. They were advised  to quit smoking in a clear, strong and personalized manner. Their willingness to quit smoking was assessed and felt to be in  preparation phase. They were offered assistance in cessation and options were explained to them today.  Patient decided to nicotine patches and gum. Patient was counseled on choosing a quit date. During out all cigarettes the night prior. Placing a patch on skin the night prior to quit date. Tapering dose of patch down every 1-2 weeks. Use of nicotine gum for acute craving. Proper use of gum was instructed to her today. 10 minutes was spent today in counseling.  Pt advised to follow up prn.    Electronically signed by: Felix Pacini, DO [facility] [place]

## 2017-06-03 ENCOUNTER — Telehealth: Payer: Self-pay | Admitting: Family Medicine

## 2017-06-03 MED ORDER — ROSUVASTATIN CALCIUM 10 MG PO TABS: 10.0000 mg | ORAL_TABLET | Freq: Every day | ORAL | 0 refills | Status: DC

## 2017-06-03 NOTE — Telephone Encounter (Signed)
Copied from CRM [id number]. Topic: Quick Communication - Rx Refill/Question >> Jun 03, 2017 11:40 AM Floria RavelingStovall, Shana A wrote: Medication:  rosuvastatin (CRESTOR) 10 MG tablet [[id number]][[id number]]   Has the patient contacted their pharmacy? no   (Agent: If no, request that the patient contact the pharmacy for the refill.)   Preferred Pharmacy (with phone number or street name): [facility] on [address] in [place]    Agent: Please be advised that RX refills may take up to 3 business days. We ask that you follow-up with your pharmacy.

## 2017-09-09 ENCOUNTER — Encounter: Payer: Self-pay | Admitting: Family Medicine

## 2017-09-09 ENCOUNTER — Ambulatory Visit: Payer: BLUE CROSS/BLUE SHIELD | Admitting: Family Medicine

## 2017-09-09 VITALS — BP 110/70 | HR 74 | Resp 16 | Ht 64.0 in | Wt 115.4 lb

## 2017-09-09 DIAGNOSIS — F418 Other specified anxiety disorders: Secondary | ICD-10-CM | POA: Diagnosis not present

## 2017-09-09 LAB — T3, FREE: T3, Free: 4.2 pg/mL (ref 2.3–4.2)

## 2017-09-09 LAB — TSH: TSH: 0.76 u[IU]/mL (ref 0.35–4.50)

## 2017-09-09 LAB — HEMOGLOBIN A1C: HEMOGLOBIN A1C: 5.9 % (ref 4.6–6.5)

## 2017-09-09 LAB — T4, FREE: Free T4: 0.9 ng/dL (ref 0.60–1.60)

## 2017-09-09 MED ORDER — VENLAFAXINE HCL ER 150 MG PO CP24: 150.0000 mg | ORAL_CAPSULE | Freq: Every day | ORAL | 1 refills | Status: DC

## 2017-09-09 MED ORDER — QUETIAPINE FUMARATE 25 MG PO TABS: ORAL_TABLET | ORAL | 0 refills | Status: DC

## 2017-09-09 NOTE — Patient Instructions (Addendum)
If you decide on therapy, please call anytime and I will refer you.  Continue effexor as as been prescribed . Start quetiapine 1 hour before bed and you can taper up in 1 week if needed.   Follow up in 4 weeks.    Depression Screening Depression screening is a tool that your health care provider can use to learn if you have symptoms of depression. Depression is a common condition with many symptoms that are also often found in other conditions. Depression is treatable, but it must first be diagnosed. You may not know that certain feelings, thoughts, and behaviors that you are having can be symptoms of depression. Taking a depression screening test can help you and your health care provider decide if you need more assessment, or if you should be referred to a mental health care provider. What are the screening tests?  You may have a physical exam to see if another condition is affecting your mental health. You may have a blood or urine sample taken during the physical exam.  You may be interviewed using a screening tool that was developed from research, such as one of these: ? Patient Health Questionnaire (PHQ). This is a set of either 2 or 9 questions. A health care provider who has been trained to score this screening test uses a guide to assess if your symptoms suggest that you may have depression. ? Hamilton Depression Rating Scale (HAM-D). This is a set of either 17 or 24 questions. You may be asked to take it again during or after your treatment, to see if your depression has gotten better. ? Beck Depression Inventory (BDI). This is a set of 21 multiple choice questions. Your health care provider scores your answers to assess:  Your level of depression, ranging from mild to severe.  Your response to treatment.  Your health care provider may talk with you about your daily activities, such as eating, sleeping, work, and recreation, and ask if you have had any changes in activity.  Your  health care provider may ask you to see a mental health specialist, such as a psychiatrist or psychologist, for more evaluation. Who should be screened for depression?  All adults, including adults with a family history of a mental health disorder.  Adolescents who are 3312-54 years old.  People who are recovering from a myocardial infarction (MI).  Pregnant women, or women who have given birth.  People who have a long-term (chronic) illness.  Anyone who has been diagnosed with another type of a mental health disorder.  Anyone who has symptoms that could show depression. What do my results mean? Your health care provider will review the results of your depression screening, physical exam, and lab tests. Positive screens suggest that you may have depression. Screening is the first step in getting the care that you may need. It is up to you to get your screening results. Ask your health care provider, or the department that is doing your screening tests, when your results will be ready. Talk with your health care provider about your results and diagnosis. A diagnosis of depression is made using the Diagnostic and Statistical Manual of Mental Disorders (DSM-V). This is a book that lists the number and type of symptoms that must be present for a health care provider to give a specific diagnosis.  Your health care provider may work with you to treat your symptoms of depression, or your health care provider may help you find a mental health provider  who can assess, diagnose, and treat your depression. Get help right away if:  You have thoughts about hurting yourself or others. If you ever feel like you may hurt yourself or others, or have thoughts about taking your own life, get help right away. You can go to your nearest emergency department or call:  Your local emergency services (911 in the [place]  A suicide crisis helpline, such as the National Suicide Prevention Lifeline at 978-834-3948.  This is open 24 hours a day.  Summary  Depression screening is the first step in getting the help that you may need.  If your screening test shows symptoms of depression (is positive), your health care provider may ask you to see a mental health provider.  Anyone who is age 88 or older should be screened for depression. This information is not intended to replace advice given to you by your health care provider. Make sure you discuss any questions you have with your health care provider. Document Released: 08/06/2016 Document Revised: 08/06/2016 Document Reviewed: 08/06/2016 Elsevier Interactive Patient Education  2018 [employer].

## 2017-09-09 NOTE — Progress Notes (Signed)
Patient ID: Michelle Arnold, female  DOB: 07/14/1965, 52 y.o.   MRN: [id number] Patient Care Team    Relationship Specialty Notifications Start End  Natalia Leatherwood, DO PCP - General Family Medicine  11/03/15     Subjective:  Michelle Arnold is a 52 y.o.  female present for follow up on anxiety.   Anxiety: Patient presents today with increased anxiety.  Last visit in January she was doing very well on 150 mg Effexor daily.  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  She states that is getting resolved so.  She noticed her anxiety increased around February or March.  She is not sleeping well.  She feels "anxious inside".  She is not feeling herself.  She is not feeling motivated.  She feels like she has to "push herself "to even do minor things like cleaning the house.  States she feels "paralyzed".  Feels she is more irritable with her grandchildren.  Her appetite is decreased again.  This occurs commonly for her when her anxiety is increased.  She has lost 10 pounds of the 15 she put on.  He has been on Wellbutrin and Remeron in the past without good results or side effects.  Reports this is been going on for couple months, but she decided to make the appointment today because she prayed about it and she felt the answer was to call here.   Mood disorder screen: negative.   Depression screen [facility] 2/9 09/09/2017 04/20/2017 10/26/2016 06/18/2016 11/04/2015  Decreased Interest 3 0 0 0 3  Down, Depressed, Hopeless 3 0 0 0 -  PHQ - 2 Score 6 0 0 0 3  Altered sleeping 3 0 0 - 3  Tired, decreased energy 3 0 0 - 3  Change in appetite 3 0 0 - 3  Feeling bad or failure about yourself  3 0 0 - 3  Trouble concentrating 3 0 0 - 3  Moving slowly or fidgety/restless 0 0 0 - 0  Suicidal thoughts 0 0 0 - 2  PHQ-9 Score 21 0 0 - 20  Difficult doing work/chores Very difficult Somewhat difficult - - -   GAD 7 : Generalized Anxiety Score  09/09/2017 04/20/2017 11/04/2015  Nervous, Anxious, on Edge 3 3 3   Control/stop worrying 3 3 2   Worry too much - different things 3 3 2   Trouble relaxing 3 1 2   Restless 3 0 2  Easily annoyed or irritable 3 0 2  Afraid - awful might happen 3 0 2  Total GAD 7 Score 21 10 15   Anxiety Difficulty Very difficult Not difficult at all Very difficult    Immunization History  Administered Date(s) Administered  . Influenza Split 02/04/2012  . Influenza Whole 01/04/2011  . Influenza,inj,Quad PF,6+ Mos 12/29/2015, 02/02/2017  . Pneumococcal Conjugate-13 06/18/2016  . Tdap 11/09/2011    Past Medical History:  Diagnosis Date  . Allergy   . Chicken pox as a child  . CTS (carpal tunnel syndrome)    right  . Depression with anxiety   . Dermatitis 11/09/2011   Right arm  . Fatigue 11/09/2011  . Hidradenitis suppurativa 12/16/2011  . Hyperlipidemia   . Measles as a child  . Migraine 11/09/2011  . Mumps as a child  . 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  Perimenopausal   . Tobacco abuse 11/09/2011  . Vitamin D deficiency    Allergies  Allergen Reactions  . Atorvastatin     Memory loss   Past Surgical History:  Procedure Laterality Date  . LEEP  2004   Family History  Problem Relation Age of Onset  . Diabetes Mother        ?  Marland Kitchen Osteoporosis Mother   . Hyperlipidemia Father   . Heart disease Father        MI in 57  . Hypertension Father   . Heart attack Father 48  . Cancer Maternal Grandmother        lung- smoker  . Osteoporosis Maternal Grandmother   . Cancer Maternal Grandfather        prostate  . Cataracts Paternal Grandmother   . Other Paternal Grandfather        PAD with gangrene  . Breast cancer Maternal Aunt    Social History   Socioeconomic History  . Marital status: Married    Spouse name: Not on file  . Number of children: Not on file  . Years of education: Not on file  .  Highest education level: Not on file  Occupational History  . Occupation: self employed   Social Needs  . Financial resource strain: Not on file  . Food insecurity:    Worry: Not on file    Inability: Not on file  . Transportation needs:    Medical: Not on file    Non-medical: Not on file  Tobacco Use  . Smoking status: Current Every Day Smoker    Packs/day: 1.00    Years: 30.00    Pack years: 30.00    Types: Cigarettes  . Smokeless tobacco: Never Used  Substance and Sexual Activity  . Alcohol use: Yes    Comment: very rare  . Drug use: No  . Sexual activity: Yes    Partners: Male    Comment: Married  Lifestyle  . Physical activity:    Days per week: Not on file    Minutes per session: Not on file  . Stress: Not on file  Relationships  . Social connections:    Talks on phone: Not on file    Gets together: Not on file    Attends religious service: Not on file    Active member of club or organization: Not on file    Attends meetings of clubs or organizations: Not on file    Relationship status: Not on file  . Intimate partner violence:    Fear of current or ex partner: Not on file    Emotionally abused: Not on file    Physically abused: Not on file    Forced sexual activity: Not on file  Other Topics Concern  . Not on file  Social History Narrative   Married. Has children.    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    Everyday smoker. Occasional alcohol. No drugs.    Allergies as of 09/09/2017      Reactions   Atorvastatin    Memory loss      Medication List        Accurate as of 09/09/17  5:30 PM. Always use your most recent med list.          BENEFIBER PO Take 1 scoop by mouth daily.   calcium gluconate 500 MG tablet Take 2 tablets by mouth daily.   Cetirizine HCl 10 MG Caps Take by mouth.   CoQ-10  100 MG Caps Take 2 capsules by mouth daily.   multivitamin tablet Take 1 tablet by mouth daily.   QUEtiapine 25 MG tablet Commonly known as:  SEROQUEL Take 1  tablet (25 mg total) by mouth at bedtime for 7 days, THEN 2 tablets (50 mg total) at bedtime for 23 days. Start taking on:  09/09/2017   rosuvastatin 10 MG tablet Commonly known as:  CRESTOR Take 1 tablet (10 mg total) by mouth daily.   SUPER B COMPLEX/C PO Take 2 tablets by mouth daily.   TURMERIC CURCUMIN PO Take 1,000 mg by mouth daily.   venlafaxine XR 150 MG 24 hr capsule Commonly known as:  EFFEXOR XR Take 1 capsule (150 mg total) by mouth daily with breakfast. Needs office visit prior to anymore refills.   vitamin B-12 1000 MCG tablet Commonly known as:  CYANOCOBALAMIN Take 1,000 mcg by mouth daily.   vitamin C 1000 MG tablet Take 1,000 mg by mouth daily.   Vitamin D3 1000 units Caps Take 2 capsules by mouth daily.       Recent Results (from the past 2160 hour(s))  TSH     Status: None   Collection Time: 09/09/17 11:14 AM  Result Value Ref Range   TSH 0.76 0.35 - 4.50 uIU/mL  T3, free     Status: None   Collection Time: 09/09/17 11:14 AM  Result Value Ref Range   T3, Free 4.2 2.3 - 4.2 pg/mL  T4, free     Status: None   Collection Time: 09/09/17 11:14 AM  Result Value Ref Range   Free T4 0.90 0.60 - 1.60 ng/dL    Comment: Specimens from patients who are undergoing biotin therapy and /or ingesting biotin supplements may contain high levels of biotin.  The higher biotin concentration in these specimens interferes with this Free T4 assay.  Specimens that contain high levels  of biotin may cause false high results for this Free T4 assay.  Please interpret results in light of the total clinical presentation of the patient.    HgB A1c     Status: None   Collection Time: 09/09/17 11:14 AM  Result Value Ref Range   Hgb A1c MFr Bld 5.9 4.6 - 6.5 %    Comment: Glycemic Control Guidelines for People with Diabetes:Non Diabetic:  <6%Goal of Therapy: <7%Additional Action Suggested:  >8%     No results found.  ROS: 14 pt review of systems performed and negative (unless  mentioned in an HPI)  Objective: BP 110/70 (BP Location: Left Arm, Patient Position: Sitting, Cuff Size: Normal)   Pulse 74   Resp 16   Ht 5\' 4"  (1.626 m)   Wt 115 lb 6 oz (52.3 kg)   LMP  (LMP Unknown)   SpO2 99%   BMI 19.80 kg/m  Gen: Afebrile. No acute distress.  Nontoxic in presentation.  Thin Caucasian female. HENT: AT. [place].  MMM. Eyes:Pupils Equal Round Reactive to light, Extraocular movements intact,  Conjunctiva without redness, discharge or icterus. Neck/lymp/endocrine: Supple, no lymphadenopathy, no thyromegaly CV: RRR no murmur, no edema Chest: CTAB, no wheeze or crackles Abd: Soft. NTND. BS present.  No masses palpated.  Skin: No rashes, purpura or petechiae.  Neuro:  Normal gait. PERLA. EOMi. Alert. Oriented.  Psych: Appears very anxious today, tearful at times, otherwise normal affect, dress and demeanor. Normal speech. Normal thought content and judgment.   Assessment/plan: SELENE PELTZER is a 52 y.o. female present for transfer/establishment. Records in care everywhere  reviewed.  Depression with anxiety -Discussed multiple options with her today.  Given that she is unable to sleep conditions are to start Seroquel 25 mg nightly, with tapering to 50 mg on day 8 if needed only.  Continue Effexor 150 mg daily. - pt declined referral to psych, but she will think about it. - Continue effexor 150 mg Qd, 90d refill x1 - F/U 4 weeks, sooner if needed.  24-hour depression/anxiety hotline was provided to patient in case of emergency.  > 25 minutes spent with patient, >50% of time spent face to face counseling    Electronically signed by: Felix Pacinienee Kuneff, DO [facility]

## 2017-10-10 ENCOUNTER — Encounter: Payer: Self-pay | Admitting: Family Medicine

## 2017-10-10 ENCOUNTER — Ambulatory Visit: Payer: BLUE CROSS/BLUE SHIELD | Admitting: Family Medicine

## 2017-10-10 VITALS — BP 111/74 | HR 85 | Temp 97.9°F | Resp 20 | Ht 64.0 in | Wt 112.5 lb

## 2017-10-10 DIAGNOSIS — F418 Other specified anxiety disorders: Secondary | ICD-10-CM

## 2017-10-10 MED ORDER — QUETIAPINE FUMARATE 25 MG PO TABS: 25.0000 mg | ORAL_TABLET | Freq: Every day | ORAL | 0 refills | Status: DC

## 2017-10-10 MED ORDER — VENLAFAXINE HCL ER 75 MG PO CP24: 75.0000 mg | ORAL_CAPSULE | Freq: Every day | ORAL | 0 refills | Status: DC

## 2017-10-10 NOTE — Patient Instructions (Signed)
A total of 225 mg a day of effexor. 150 mg cap + 75 mg cap (new-ordered) daily.  Continue  Seroquel 1 tab before bed.   Follow up in 3 months, unless needed sooner.

## 2017-10-10 NOTE — Progress Notes (Signed)
Patient ID: Michelle Arnold, female  DOB: 28-Sep-1965, 52 y.o.   MRN: [id number] Patient Care Team    Relationship Specialty Notifications Start End  Natalia Leatherwood, DO PCP - General Family Medicine  11/03/15     Subjective:  Michelle Arnold is a 52 y.o.  female present for follow up on anxiety.   Anxiety: She presents today for follow-up after changing medications 1 month ago for increased anxiety.  See full note from June listed below for details.  Patient reports she feels better with the addition of Seroquel nightly.  Although improved she still feels her anxiety is higher than prior.  The Seroquel helps her sleep as well but it does make her quite drowsy. Prior note 09/09/2017 Patient presents today with increased anxiety.  Last visit in January she was doing very well on 150 mg Effexor daily.  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  She states that is getting resolved so.  She noticed her anxiety increased around February or March.  She is not sleeping well.  She feels "anxious inside".  She is not feeling herself.  She is not feeling motivated.  She feels like she has to "push herself "to even do minor things like cleaning the house.  States she feels "paralyzed".  Feels she is more irritable with her grandchildren.  Her appetite is decreased again.  This occurs commonly for her when her anxiety is increased.  She has lost 10 pounds of the 15 she put on.  He has been on Wellbutrin and Remeron in the past without good results or side effects.  Reports this is been going on for couple months, but she decided to make the appointment today because she prayed about it and she felt the answer was to call here.   Mood disorder screen: negative.   Depression screen [facility] 2/9 10/10/2017 09/09/2017 04/20/2017 10/26/2016 06/18/2016  Decreased Interest 2 3 0 0 0  Down, Depressed, Hopeless 2 3 0 0 0  PHQ - 2 Score 4 6 0 0 0  Altered  sleeping 0 3 0 0 -  Tired, decreased energy 1 3 0 0 -  Change in appetite 2 3 0 0 -  Feeling bad or failure about yourself  2 3 0 0 -  Trouble concentrating 2 3 0 0 -  Moving slowly or fidgety/restless 0 0 0 0 -  Suicidal thoughts 0 0 0 0 -  PHQ-9 Score 11 21 0 0 -  Difficult doing work/chores - Very difficult Somewhat difficult - -   GAD 7 : Generalized Anxiety Score 10/10/2017 09/09/2017 04/20/2017 11/04/2015  Nervous, Anxious, on Edge 2 3 3 3   Control/stop worrying 1 3 3 2   Worry too much - different things 2 3 3 2   Trouble relaxing 1 3 1 2   Restless 1 3 0 2  Easily annoyed or irritable 0 3 0 2  Afraid - awful might happen 3 3 0 2  Total GAD 7 Score 10 21 10 15   Anxiety Difficulty - Very difficult Not difficult at all Very difficult    Immunization History  Administered Date(s) Administered  . Influenza Split 02/04/2012  . Influenza Whole 01/04/2011  . Influenza,inj,Quad PF,6+ Mos 12/29/2015, 02/02/2017  . Pneumococcal Conjugate-13 06/18/2016  . Tdap 11/09/2011    Past Medical History:  Diagnosis Date  . Allergy   . Chicken pox as a child  .  CTS (carpal tunnel syndrome)    right  . Depression with anxiety   . Dermatitis 11/09/2011   Right arm  . Fatigue 11/09/2011  . Hidradenitis suppurativa 12/16/2011  . Hyperlipidemia   . Measles as a child  . Migraine 11/09/2011  . Mumps as a child  . 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 Perimenopausal   . Tobacco abuse 11/09/2011  . Vitamin D deficiency    Allergies  Allergen Reactions  . Atorvastatin     Memory loss   Past Surgical History:  Procedure Laterality Date  . LEEP  2004   Family History  Problem Relation Age of Onset  . Diabetes Mother        ?  Marland Kitchen Osteoporosis Mother   . Hyperlipidemia Father   . Heart disease Father        MI in 69  . Hypertension Father   . Heart attack Father 58  . Cancer Maternal Grandmother         lung- smoker  . Osteoporosis Maternal Grandmother   . Cancer Maternal Grandfather        prostate  . Cataracts Paternal Grandmother   . Other Paternal Grandfather        PAD with gangrene  . Breast cancer Maternal Aunt    Social History   Socioeconomic History  . Marital status: Married    Spouse name: Not on file  . Number of children: Not on file  . Years of education: Not on file  . Highest education level: Not on file  Occupational History  . Occupation: self employed   Social Needs  . Financial resource strain: Not on file  . Food insecurity:    Worry: Not on file    Inability: Not on file  . Transportation needs:    Medical: Not on file    Non-medical: Not on file  Tobacco Use  . Smoking status: Current Every Day Smoker    Packs/day: 1.00    Years: 30.00    Pack years: 30.00    Types: Cigarettes  . Smokeless tobacco: Never Used  Substance and Sexual Activity  . Alcohol use: Yes    Comment: very rare  . Drug use: No  . Sexual activity: Yes    Partners: Male    Comment: Married  Lifestyle  . Physical activity:    Days per week: Not on file    Minutes per session: Not on file  . Stress: Not on file  Relationships  . Social connections:    Talks on phone: Not on file    Gets together: Not on file    Attends religious service: Not on file    Active member of club or organization: Not on file    Attends meetings of clubs or organizations: Not on file    Relationship status: Not on file  . Intimate partner violence:    Fear of current or ex partner: Not on file    Emotionally abused: Not on file    Physically abused: Not on file    Forced sexual activity: Not on file  Other Topics Concern  . Not on file  Social History Narrative   Married. Has children.    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    Everyday smoker. Occasional alcohol. No drugs.    Allergies as of 10/10/2017      Reactions   Atorvastatin  Memory loss      Medication List        Accurate as of  10/10/17 11:02 AM. Always use your most recent med list.          BENEFIBER PO Take 1 scoop by mouth daily.   calcium gluconate 500 MG tablet Take 2 tablets by mouth daily.   Cetirizine HCl 10 MG Caps Take by mouth.   CoQ-10 100 MG Caps Take 2 capsules by mouth daily.   multivitamin tablet Take 1 tablet by mouth daily.   QUEtiapine 25 MG tablet Commonly known as:  SEROQUEL Take 1 tablet (25 mg total) by mouth at bedtime for 7 days, THEN 2 tablets (50 mg total) at bedtime for 23 days. Start taking on:  09/09/2017   rosuvastatin 10 MG tablet Commonly known as:  CRESTOR Take 1 tablet (10 mg total) by mouth daily.   SUPER B COMPLEX/C PO Take 2 tablets by mouth daily.   TURMERIC CURCUMIN PO Take 1,000 mg by mouth daily.   venlafaxine XR 150 MG 24 hr capsule Commonly known as:  EFFEXOR XR Take 1 capsule (150 mg total) by mouth daily with breakfast. Needs office visit prior to anymore refills.   vitamin B-12 1000 MCG tablet Commonly known as:  CYANOCOBALAMIN Take 1,000 mcg by mouth daily.   vitamin C 1000 MG tablet Take 1,000 mg by mouth daily.   Vitamin D3 1000 units Caps Take 2 capsules by mouth daily.       Recent Results (from the past 2160 hour(s))  TSH     Status: None   Collection Time: 09/09/17 11:14 AM  Result Value Ref Range   TSH 0.76 0.35 - 4.50 uIU/mL  T3, free     Status: None   Collection Time: 09/09/17 11:14 AM  Result Value Ref Range   T3, Free 4.2 2.3 - 4.2 pg/mL  T4, free     Status: None   Collection Time: 09/09/17 11:14 AM  Result Value Ref Range   Free T4 0.90 0.60 - 1.60 ng/dL    Comment: Specimens from patients who are undergoing biotin therapy and /or ingesting biotin supplements may contain high levels of biotin.  The higher biotin concentration in these specimens interferes with this Free T4 assay.  Specimens that contain high levels  of biotin may cause false high results for this Free T4 assay.  Please interpret results in light of  the total clinical presentation of the patient.    HgB A1c     Status: None   Collection Time: 09/09/17 11:14 AM  Result Value Ref Range   Hgb A1c MFr Bld 5.9 4.6 - 6.5 %    Comment: Glycemic Control Guidelines for People with Diabetes:Non Diabetic:  <6%Goal of Therapy: <7%Additional Action Suggested:  >8%     No results found.  ROS: 14 pt review of systems performed and negative (unless mentioned in an HPI)  Objective: BP 111/74 (BP Location: Right Arm, Patient Position: Sitting, Cuff Size: Normal)   Pulse 85   Temp 97.9 F (36.6 C)   Resp 20   Ht 5\' 4"  (1.626 m)   Wt 112 lb 8 oz (51 kg)   LMP  (LMP Unknown)   SpO2 97%   BMI 19.31 kg/m  Gen: Afebrile. No acute distress.  Nontoxic in appearance, well-developed, well-nourished, Caucasian female. HENT: AT. [place].  MMM.  Eyes:Pupils Equal Round Reactive to light, Extraocular movements intact,  Conjunctiva without redness, discharge or icterus. CV: RRR  Chest: CTAB, no wheeze or crackles  Neuro:  Normal gait. PERLA. EOMi. Alert. Oriented.  Psych: Normal affect, dress and demeanor. Normal speech. Normal thought content and judgment.   Assessment/plan: Arrie SenateLisa M Pigford is a 52 y.o. female present for transfer/establishment. Records in care everywhere reviewed.  Depression with anxiety -Improved but not controlled.  Continue Seroquel 25 mg nightly.  Refills provided today.  Increase Effexor from 150 mg to 225 mg . - pt declined referral to psych, but she will think about it. - F/U 3 months as needed sooner if needed.  24-hour depression/anxiety hotline was provided to patient in case of emergency.    Electronically signed by: Felix Pacinienee Kuneff, DO [facility]

## 2017-10-13 ENCOUNTER — Encounter: Payer: Self-pay | Admitting: Family Medicine

## 2018-01-10 ENCOUNTER — Ambulatory Visit: Payer: BLUE CROSS/BLUE SHIELD | Admitting: Family Medicine

## 2018-01-11 ENCOUNTER — Encounter: Payer: Self-pay | Admitting: Family Medicine

## 2018-01-11 ENCOUNTER — Ambulatory Visit: Payer: BLUE CROSS/BLUE SHIELD | Admitting: Family Medicine

## 2018-01-11 VITALS — BP 104/57 | HR 81 | Temp 97.7°F | Resp 20 | Ht 64.0 in | Wt 110.0 lb

## 2018-01-11 DIAGNOSIS — R42 Dizziness and giddiness: Secondary | ICD-10-CM | POA: Insufficient documentation

## 2018-01-11 DIAGNOSIS — R631 Polydipsia: Secondary | ICD-10-CM

## 2018-01-11 DIAGNOSIS — F418 Other specified anxiety disorders: Secondary | ICD-10-CM | POA: Diagnosis not present

## 2018-01-11 DIAGNOSIS — Z23 Encounter for immunization: Secondary | ICD-10-CM | POA: Diagnosis not present

## 2018-01-11 HISTORY — DX: Polydipsia: R63.1

## 2018-01-11 LAB — CBC
HEMATOCRIT: 41.6 % (ref 36.0–46.0)
HEMOGLOBIN: 14 g/dL (ref 12.0–15.0)
MCHC: 33.6 g/dL (ref 30.0–36.0)
MCV: 91.7 fl (ref 78.0–100.0)
Platelets: 328 10*3/uL (ref 150.0–400.0)
RBC: 4.54 Mil/uL (ref 3.87–5.11)
RDW: 13.2 % (ref 11.5–15.5)
WBC: 15.4 10*3/uL — ABNORMAL HIGH (ref 4.0–10.5)

## 2018-01-11 MED ORDER — VENLAFAXINE HCL ER 75 MG PO CP24: 75.0000 mg | ORAL_CAPSULE | Freq: Every day | ORAL | 0 refills | Status: DC

## 2018-01-11 MED ORDER — QUETIAPINE FUMARATE 25 MG PO TABS: 12.5000 mg | ORAL_TABLET | Freq: Every day | ORAL | 1 refills | Status: DC

## 2018-01-11 MED ORDER — VENLAFAXINE HCL ER 150 MG PO CP24: 150.0000 mg | ORAL_CAPSULE | Freq: Every day | ORAL | 1 refills | Status: DC

## 2018-01-11 NOTE — Patient Instructions (Addendum)
Restart seroquel at 1/2 tab before bed. If not ideal control after 3-5 days, then increase the effexor dose as we discussed.  Hopefully the 1/2 tab of seroquel will not give you the sleep hangover.  Follow up in 6 months if doing well with changes. If not follow up in 6 weeks after increasing your effexor dose.   Drink at least 80-100 ounces of water a day to see if that helps with dizziness.   Orthostatic Hypotension Orthostatic hypotension is a sudden drop in blood pressure that happens when you quickly change positions, such as when you get up from a seated or lying position. Blood pressure is a measurement of how strongly, or weakly, your blood is pressing against the walls of your arteries. Arteries are blood vessels that carry blood from your heart throughout your body. When blood pressure is too low, you may not get enough blood to your brain or to the rest of your organs. This can cause weakness, light-headedness, rapid heartbeat, and fainting. This can last for just a few seconds or for up to a few minutes. Orthostatic hypotension is usually not a serious problem. However, if it happens frequently or gets worse, it may be a sign of something more serious. What are the causes? This condition may be caused by:  Sudden changes in posture, such as standing up quickly after you have been sitting or lying down.  Blood loss.  Loss of body fluids (dehydration).  Heart problems.  Hormone (endocrine) problems.  Pregnancy.  Severe infection.  Lack of certain nutrients.  Severe allergic reactions (anaphylaxis).  Certain medicines, such as blood pressure medicine or medicines that make the body lose excess fluids (diuretics). Sometimes, this condition can be caused by not taking medicine as directed, such as taking too much of a certain medicine.  What increases the risk? Certain factors can make you more likely to develop orthostatic hypotension, including:  Age. Risk increases as you  get older.  Conditions that affect the heart or the central nervous system.  Taking certain medicines, such as blood pressure medicine or diuretics.  Being pregnant.  What are the signs or symptoms? Symptoms of this condition may include:  Weakness.  Light-headedness.  Dizziness.  Blurred vision.  Fatigue.  Rapid heartbeat.  Fainting, in severe cases.  How is this diagnosed? This condition is diagnosed based on:  Your medical history.  Your symptoms.  Your blood pressure measurement. Your health care provider will check your blood pressure when you are: ? Lying down. ? Sitting. ? Standing.  A blood pressure reading is recorded as two numbers, such as "120 over 80" (or 120/80). The first ("top") number is called the systolic pressure. It is a measure of the pressure in your arteries as your heart beats. The second ("bottom") number is called the diastolic pressure. It is a measure of the pressure in your arteries when your heart relaxes between beats. Blood pressure is measured in a unit called mm Hg. Healthy blood pressure for adults is 120/80. If your blood pressure is below 90/60, you may be diagnosed with hypotension. Other information or tests that may be used to diagnose orthostatic hypotension include:  Your other vital signs, such as your heart rate and temperature.  Blood tests.  Tilt table test. For this test, you will be safely secured to a table that moves you from a lying position to an upright position. Your heart rhythm and blood pressure will be monitored during the test.  How is  this treated? Treatment for this condition may include:  Changing your diet. This may involve eating more salt (sodium) or drinking more water.  Taking medicines to raise your blood pressure.  Changing the dosage of certain medicines you are taking that might be lowering your blood pressure.  Wearing compression stockings. These stockings help to prevent blood clots and  reduce swelling in your legs.  In some cases, you may need to go to the hospital for:  Fluid replacement. This means you will receive fluids through an IV tube.  Blood replacement. This means you will receive donated blood through an IV tube (transfusion).  Treating an infection or heart problems, if this applies.  Monitoring. You may need to be monitored while medicines that you are taking wear off.  Follow these instructions at home: Eating and drinking   Drink enough fluid to keep your urine clear or pale yellow.  Eat a healthy diet and follow instructions from your health care provider about eating or drinking restrictions. A healthy diet includes: ? Fresh fruits and vegetables. ? Whole grains. ? Lean meats. ? Low-fat dairy products.  Eat extra salt only as directed. Do not add extra salt to your diet unless your health care provider told you to do that.  Eat frequent, small meals.  Avoid standing up suddenly after eating. Medicines  Take over-the-counter and prescription medicines only as told by your health care provider. ? Follow instructions from your health care provider about changing the dosage of your current medicines, if this applies. ? Do not stop or adjust any of your medicines on your own. General instructions  Wear compression stockings as told by your health care provider.  Get up slowly from lying down or sitting positions. This gives your blood pressure a chance to adjust.  Avoid hot showers and excessive heat as directed by your health care provider.  Return to your normal activities as told by your health care provider. Ask your health care provider what activities are safe for you.  Do not use any products that contain nicotine or tobacco, such as cigarettes and e-cigarettes. If you need help quitting, ask your health care provider.  Keep all follow-up visits as told by your health care provider. This is important. Contact a health care provider  if:  You vomit.  You have diarrhea.  You have a fever for more than 2-3 days.  You feel more thirsty than usual.  You feel weak and tired. Get help right away if:  You have chest pain.  You have a fast or irregular heartbeat.  You develop numbness in any part of your body.  You cannot move your arms or your legs.  You have trouble speaking.  You become sweaty or feel lightheaded.  You faint.  You feel short of breath.  You have trouble staying awake.  You feel confused. This information is not intended to replace advice given to you by your health care provider. Make sure you discuss any questions you have with your health care provider. Document Released: 03/12/2002 Document Revised: 12/09/2015 Document Reviewed: 09/12/2015 Elsevier Interactive Patient Education  2018 [employer].   Living With Anxiety After being diagnosed with an anxiety disorder, you may be relieved to know why you have felt or behaved a certain way. It is natural to also feel overwhelmed about the treatment ahead and what it will mean for your life. With care and support, you can manage this condition and recover from it. How to cope  with anxiety Dealing with stress Stress is your body's reaction to life changes and events, both good and bad. Stress can last just a few hours or it can be ongoing. Stress can play a major role in anxiety, so it is important to learn both how to cope with stress and how to think about it differently. Talk with your health care provider or a counselor to learn more about stress reduction. He or she may suggest some stress reduction techniques, such as:  Music therapy. This can include creating or listening to music that you enjoy and that inspires you.  Mindfulness-based meditation. This involves being aware of your normal breaths, rather than trying to control your breathing. It can be done while sitting or walking.  Centering prayer. This is a kind of meditation  that involves focusing on a word, phrase, or sacred image that is meaningful to you and that brings you peace.  Deep breathing. To do this, expand your stomach and inhale slowly through your nose. Hold your breath for 3-5 seconds. Then exhale slowly, allowing your stomach muscles to relax.  Self-talk. This is a skill where you identify thought patterns that lead to anxiety reactions and correct those thoughts.  Muscle relaxation. This involves tensing muscles then relaxing them.  Choose a stress reduction technique that fits your lifestyle and personality. Stress reduction techniques take time and practice. Set aside 5-15 minutes a day to do them. Therapists can offer training in these techniques. The training may be covered by some insurance plans. Other things you can do to manage stress include:  Keeping a stress diary. This can help you learn what triggers your stress and ways to control your response.  Thinking about how you respond to certain situations. You may not be able to control everything, but you can control your reaction.  Making time for activities that help you relax, and not feeling guilty about spending your time in this way.  Therapy combined with coping and stress-reduction skills provides the best chance for successful treatment. Medicines Medicines can help ease symptoms. Medicines for anxiety include:  Anti-anxiety drugs.  Antidepressants.  Beta-blockers.  Medicines may be used as the main treatment for anxiety disorder, along with therapy, or if other treatments are not working. Medicines should be prescribed by a health care provider. Relationships Relationships can play a big part in helping you recover. Try to spend more time connecting with trusted friends and family members. Consider going to couples counseling, taking family education classes, or going to family therapy. Therapy can help you and others better understand the condition. How to recognize  changes in your condition Everyone has a different response to treatment for anxiety. Recovery from anxiety happens when symptoms decrease and stop interfering with your daily activities at home or work. This may mean that you will start to:  Have better concentration and focus.  Sleep better.  Be less irritable.  Have more energy.  Have improved memory.  It is important to recognize when your condition is getting worse. Contact your health care provider if your symptoms interfere with home or work and you do not feel like your condition is improving. Where to find help and support: You can get help and support from these sources:  Self-help groups.  Online and [employer].  A trusted spiritual leader.  Couples counseling.  Family education classes.  Family therapy.  Follow these instructions at home:  Eat a healthy diet that includes plenty of vegetables, fruits, whole grains,  low-fat dairy products, and lean protein. Do not eat a lot of foods that are high in solid fats, added sugars, or salt.  Exercise. Most adults should do the following: ? Exercise for at least 150 minutes each week. The exercise should increase your heart rate and make you sweat (moderate-intensity exercise). ? Strengthening exercises at least twice a week.  Cut down on caffeine, tobacco, alcohol, and other potentially harmful substances.  Get the right amount and quality of sleep. Most adults need 7-9 hours of sleep each night.  Make choices that simplify your life.  Take over-the-counter and prescription medicines only as told by your health care provider.  Avoid caffeine, alcohol, and certain over-the-counter cold medicines. These may make you feel worse. Ask your pharmacist which medicines to avoid.  Keep all follow-up visits as told by your health care provider. This is important. Questions to ask your health care provider  Would I benefit from therapy?  How often should I  follow up with a health care provider?  How long do I need to take medicine?  Are there any long-term side effects of my medicine?  Are there any alternatives to taking medicine? Contact a health care provider if:  You have a hard time staying focused or finishing daily tasks.  You spend many hours a day feeling worried about everyday life.  You become exhausted by worry.  You start to have headaches, feel tense, or have nausea.  You urinate more than normal.  You have diarrhea. Get help right away if:  You have a racing heart and shortness of breath.  You have thoughts of hurting yourself or others. If you ever feel like you may hurt yourself or others, or have thoughts about taking your own life, get help right away. You can go to your nearest emergency department or call:  Your local emergency services (911 in the [place]  A suicide crisis helpline, such as the National Suicide Prevention Lifeline at 804-177-9486. This is open 24-hours a day.  Summary  Taking steps to deal with stress can help calm you.  Medicines cannot cure anxiety disorders, but they can help ease symptoms.  Family, friends, and partners can play a big part in helping you recover from an anxiety disorder. This information is not intended to replace advice given to you by your health care provider. Make sure you discuss any questions you have with your health care provider. Document Released: 03/16/2016 Document Revised: 03/16/2016 Document Reviewed: 03/16/2016 Elsevier Interactive Patient Education  [employer].

## 2018-01-11 NOTE — Progress Notes (Signed)
Patient ID: Michelle Arnold, female  DOB: Jan 31, 1966, 52 y.o.   MRN: [id number] Patient Care Team    Relationship Specialty Notifications Start End  Natalia Leatherwood, DO PCP - General Family Medicine  11/03/15     Subjective:  Michelle Arnold is a 52 y.o.  female present for follow up on anxiety.   Anxiety: ZHANAE Arnold is a 52 y.o. female present for anxiety follow up. She reports she did not increase the effexor to 225 mg and she stopped the seroquel because it was causing her to be too sleepy in the morning. She was taking at 9 pm at night and she would fall asleep right away after taking. She stills feels her anxiety is better than it use to be, but not controlled.   After appt was over, She also states she has occasional dizziness when transitioning positions, feels thirsty all the time despite drinking 64 ounces of water daily and has a boil on her inner thigh.    Prior note: She presents today for follow-up after changing medications 1 month ago for increased anxiety.  See full note from June listed below for details.  Patient reports she feels better with the addition of Seroquel nightly.  Although improved she still feels her anxiety is higher than prior.  The Seroquel helps her sleep as well but it does make her quite drowsy. Prior note 09/09/2017 Patient presents today with increased anxiety.  Last visit in January she was doing very well on 150 mg Effexor daily.  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  She states that is getting resolved so.  She noticed her anxiety increased around February or March.  She is not sleeping well.  She feels "anxious inside".  She is not feeling herself.  She is not feeling motivated.  She feels like she has to "push herself "to even do minor things like cleaning the house.  States she feels "paralyzed".  Feels she is more irritable with her grandchildren.  Her appetite is  decreased again.  This occurs commonly for her when her anxiety is increased.  She has lost 10 pounds of the 15 she put on.  He has been on Wellbutrin and Remeron in the past without good results or side effects.  Reports this is been going on for couple months, but she decided to make the appointment today because she prayed about it and she felt the answer was to call here.   Mood disorder screen: negative.   Depression screen [facility] 2/9 01/11/2018 10/10/2017 09/09/2017 04/20/2017 10/26/2016  Decreased Interest 3 2 3  0 0  Down, Depressed, Hopeless 3 2 3  0 0  PHQ - 2 Score 6 4 6  0 0  Altered sleeping 2 0 3 0 0  Tired, decreased energy 3 1 3  0 0  Change in appetite 3 2 3  0 0  Feeling bad or failure about yourself  1 2 3  0 0  Trouble concentrating 2 2 3  0 0  Moving slowly or fidgety/restless 0 0 0 0 0  Suicidal thoughts 0 0 0 0 0  PHQ-9 Score 17 11 21  0 0  Difficult doing work/chores Very difficult - Very difficult Somewhat difficult -   GAD 7 : Generalized Anxiety Score 01/11/2018 10/10/2017 09/09/2017 04/20/2017  Nervous, Anxious, on Edge 3 2 3 3   Control/stop worrying 2 1 3 3   Worry too much -  different things 2 2 3 3   Trouble relaxing 3 1 3 1   Restless - 1 3 0  Easily annoyed or irritable 1 0 3 0  Afraid - awful might happen 3 3 3  0  Total GAD 7 Score - 10 21 10   Anxiety Difficulty Very difficult - Very difficult Not difficult at all    Immunization History  Administered Date(s) Administered  . Influenza Split 02/04/2012  . Influenza Whole 01/04/2011  . Influenza,inj,Quad PF,6+ Mos 12/29/2015, 02/02/2017  . Pneumococcal Conjugate-13 06/18/2016  . Tdap 11/09/2011    Past Medical History:  Diagnosis Date  . Allergy   . Chicken pox as a child  . CTS (carpal tunnel syndrome)    right  . Depression with anxiety   . Dermatitis 11/09/2011   Right arm  . Fatigue 11/09/2011  . Hidradenitis suppurativa 12/16/2011  . Hyperlipidemia   . Measles as a child  . Migraine 11/09/2011  . Mumps as a  child  . 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 Perimenopausal   . Tobacco abuse 11/09/2011  . Vitamin D deficiency    Allergies  Allergen Reactions  . Atorvastatin     Memory loss   Past Surgical History:  Procedure Laterality Date  . LEEP  2004   Family History  Problem Relation Age of Onset  . Diabetes Mother        ?  Marland Kitchen Osteoporosis Mother   . Hyperlipidemia Father   . Heart disease Father        MI in 76  . Hypertension Father   . Heart attack Father 61  . Cancer Maternal Grandmother        lung- smoker  . Osteoporosis Maternal Grandmother   . Cancer Maternal Grandfather        prostate  . Cataracts Paternal Grandmother   . Other Paternal Grandfather        PAD with gangrene  . Breast cancer Maternal Aunt    Social History   Socioeconomic History  . Marital status: Married    Spouse name: Not on file  . Number of children: Not on file  . Years of education: Not on file  . Highest education level: Not on file  Occupational History  . Occupation: self employed   Social Needs  . Financial resource strain: Not on file  . Food insecurity:    Worry: Not on file    Inability: Not on file  . Transportation needs:    Medical: Not on file    Non-medical: Not on file  Tobacco Use  . Smoking status: Current Every Day Smoker    Packs/day: 1.00    Years: 30.00    Pack years: 30.00    Types: Cigarettes  . Smokeless tobacco: Never Used  Substance and Sexual Activity  . Alcohol use: Yes    Comment: very rare  . Drug use: No  . Sexual activity: Yes    Partners: Male    Comment: Married  Lifestyle  . Physical activity:    Days per week: Not on file    Minutes per session: Not on file  . Stress: Not on file  Relationships  . Social connections:    Talks on phone: Not on file    Gets together: Not on file    Attends religious service: Not on file    Active member of  club or organization:  Not on file    Attends meetings of clubs or organizations: Not on file    Relationship status: Not on file  . Intimate partner violence:    Fear of current or ex partner: Not on file    Emotionally abused: Not on file    Physically abused: Not on file    Forced sexual activity: Not on file  Other Topics Concern  . Not on file  Social History Narrative   Married. Has children.    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    Everyday smoker. Occasional alcohol. No drugs.    Allergies as of 01/11/2018      Reactions   Atorvastatin    Memory loss      Medication List        Accurate as of 01/11/18 11:04 AM. Always use your most recent med list.          calcium gluconate 500 MG tablet Take 2 tablets by mouth daily.   Cetirizine HCl 10 MG Caps Take by mouth.   CoQ-10 100 MG Caps Take 2 capsules by mouth daily.   multivitamin tablet Take 1 tablet by mouth daily.   QUEtiapine 25 MG tablet Commonly known as:  SEROQUEL Take 1 tablet (25 mg total) by mouth at bedtime.   rosuvastatin 10 MG tablet Commonly known as:  CRESTOR Take 1 tablet (10 mg total) by mouth daily.   SUPER B COMPLEX/C PO Take 2 tablets by mouth daily.   TURMERIC CURCUMIN PO Take 1,000 mg by mouth daily.   venlafaxine XR 150 MG 24 hr capsule Commonly known as:  EFFEXOR-XR Take 1 capsule (150 mg total) by mouth daily with breakfast. Needs office visit prior to anymore refills.   venlafaxine XR 75 MG 24 hr capsule Commonly known as:  EFFEXOR-XR Take 1 capsule (75 mg total) by mouth daily with breakfast. 1 tab daily (75 mg), in addition to 150 mg cap for a total of 225 mg a day.   vitamin B-12 1000 MCG tablet Commonly known as:  CYANOCOBALAMIN Take 1,000 mcg by mouth daily.   vitamin C 1000 MG tablet Take 1,000 mg by mouth daily.   Vitamin D3 1000 units Caps Take 2 capsules by mouth daily.       No results found for this or any previous visit (from the past 2160 hour(s)).  No  results found.  ROS: 14 pt review of systems performed and negative (unless mentioned in an HPI)  Objective: BP (!) 104/57 (BP Location: Right Arm, Patient Position: Sitting, Cuff Size: Normal)   Pulse 81   Temp 97.7 F (36.5 C)   Resp 20   Ht 5\' 4"  (1.626 m)   Wt 110 lb (49.9 kg)   LMP  (LMP Unknown)   SpO2 100%   BMI 18.88 kg/m  Gen: Afebrile. No acute distress. Nontoxic, thin caucasian female.  HENT: AT. [place].  MMM.  Eyes:Pupils Equal Round Reactive to light, Extraocular movements intact,  Conjunctiva without redness, discharge or icterus. Neck/lymp/endocrine: Supple,no lymphadenopathy, no thyromegaly CV: RRR no murmur, no edema Chest: CTAB, no wheeze or crackles Neuro:  Normal gait. PERLA. EOMi. Alert. Oriented.  Psych: Anxious, otherwise Normal affect, dress and demeanor. Normal speech. Normal thought content and judgment.   Assessment/plan: EBONYE READE is a 52 y.o. female present for  Depression with anxiety - Not controlled. Lengthy discussion again today surrounding treatment. - restart seroquel at 12.5 mg dose. Hopefully will be able to tolerate with morning sedation. Would  like to try to keep for potential weight gain, as well as D/A agent - Continue effexor 150 mg QD for now. Increase to 225 mg QD if after restarting seroquel she feels she needs additional anxiety coverage.    - could try adding abilify if the above does not work.  - tried: effexor, zoloft, wellbutrin, remeron, seroquel - pt declined referral to psych - F/U 6 months if doing well, 6 weeks if not ideal coverage.   Immunization due - Flu Vaccine QUAD 6+ mos PF IM (Fluarix Quad PF)  Orthostatic dizziness/polydipsia New. A1c and tsh were normal w/in last 3-6 months.  - advised her to drink 60-80 ounces of water and one G2 or V8 daily.  - CBC - Iron, TIBC and Ferritin Panel  Reported "Boil"--> mentioned after appt was over. Advised her to use epson salt soaks and if conditions worsens or appears  infected, or does not resolve with in 1-2 weeks to follow up to be eva;uated for that condition.   > 25 minutes spent with patient, >50% of time spent face to face counseling   Electronically signed by: Felix Pacini, DO [facility] [place]

## 2018-01-12 ENCOUNTER — Telehealth: Payer: Self-pay | Admitting: Family Medicine

## 2018-01-12 LAB — IRON,TIBC AND FERRITIN PANEL
%SAT: 15 % — ABNORMAL LOW (ref 16–45)
Ferritin: 121 ng/mL (ref 16–232)
Iron: 48 ug/dL (ref 45–160)
TIBC: 314 ug/dL (ref 250–450)

## 2018-01-12 NOTE — Telephone Encounter (Signed)
Please inform patient the following information: - Her labs indicate She is not anemic, and her iron is normal- but at the lowest end of normal. I would recommend she research iron rich foods and incorporate more into her diet. I do not believe she needs a supplement yet, She can likely get what her body needs by adding foods to her diet and increasing vit c (by supplement or citrus fruits etc) that helps absorb iron. - Her blood count suggest she has a rather significant infection of some type. At the end of the appt for depression/anxiety, she mentioned a "boil", but time did not allow [place] to also address that issue. If that is more of an abscess it can be the source of her wbc being high. I would suggest she soak in epson salt as we discussed, keep area clean, dry  and be seen ASAP for this issue, if need be at an UC if worsening.

## 2018-01-12 NOTE — Telephone Encounter (Signed)
Left detailed message with results and instructions on patient voice mail per DPR instructed patient to call back if any questions.

## 2018-01-19 ENCOUNTER — Telehealth: Payer: Self-pay | Admitting: *Deleted

## 2018-01-19 NOTE — Telephone Encounter (Signed)
Copied from CRM [id number]. Topic: General - Other >> Jan 19, 2018  9:50 AM Ronney Lion A wrote: Reason for CRM: Pt says QUEtiapine (SEROQUEL) 25 MG tablet  is not working for her, she says it's making her feel worse the next day. She says she is very physically shaky, and un- easy.    Please advise

## 2018-01-19 NOTE — Telephone Encounter (Signed)
She was to try 12.5 (1/2 tab) of the seroquel, not the full 25 mg. However if it makes her feel bad, don't take it.  She can follow the instructions on her AVS and increase her effexor to 225 mg as discussed--if desired--- or keep at effexor 150 mg Qd. Instructions were written in detail on her AVS

## 2018-01-20 NOTE — Telephone Encounter (Signed)
Spoke with patient reviewed information and instructions. Patient verbalized understanding. Patient has discontinued seroquel and is taking the effexor 225 mg.

## 2018-02-02 ENCOUNTER — Other Ambulatory Visit: Payer: Self-pay | Admitting: *Deleted

## 2018-02-02 MED ORDER — ROSUVASTATIN CALCIUM 10 MG PO TABS: 10.0000 mg | ORAL_TABLET | Freq: Every day | ORAL | 0 refills | Status: DC

## 2018-07-12 ENCOUNTER — Other Ambulatory Visit: Payer: Self-pay

## 2018-07-12 ENCOUNTER — Encounter: Payer: Self-pay | Admitting: Family Medicine

## 2018-07-13 ENCOUNTER — Ambulatory Visit: Payer: BLUE CROSS/BLUE SHIELD | Admitting: Family Medicine

## 2018-07-18 ENCOUNTER — Encounter: Payer: Self-pay | Admitting: Family Medicine

## 2018-07-20 ENCOUNTER — Other Ambulatory Visit: Payer: Self-pay

## 2018-07-20 ENCOUNTER — Ambulatory Visit (INDEPENDENT_AMBULATORY_CARE_PROVIDER_SITE_OTHER): Payer: Self-pay | Admitting: Family Medicine

## 2018-07-20 ENCOUNTER — Encounter: Payer: Self-pay | Admitting: Family Medicine

## 2018-07-20 VITALS — Ht 64.0 in

## 2018-07-20 DIAGNOSIS — Z888 Allergy status to other drugs, medicaments and biological substances status: Secondary | ICD-10-CM | POA: Insufficient documentation

## 2018-07-20 DIAGNOSIS — F418 Other specified anxiety disorders: Secondary | ICD-10-CM

## 2018-07-20 MED ORDER — VENLAFAXINE HCL ER 150 MG PO CP24: 150.0000 mg | ORAL_CAPSULE | Freq: Every day | ORAL | 1 refills | Status: DC

## 2018-07-20 NOTE — Progress Notes (Signed)
VIRTUAL VISIT VIA VIDEO  I connected with Michelle Arnold on 07/20/18 at 10:40 AM EDT by a video enabled telemedicine application and verified that I am speaking with the correct person using two identifiers. Location patient: Home Location provider: [facility], Office Persons participating in the virtual visit: Patient, Dr. Claiborne Billings and R.Baker, LPN  I discussed the limitations of evaluation and management by telemedicine and the availability of in person appointments. The patient expressed understanding and agreed to proceed.   SUBJECTIVE Chief Complaint  Patient presents with  . Anxiety    Pt is doing well with no complaints. Needs refills on medications.   . Depression  . Hyperlipidemia    Pt stopped Crestor due to the side effects     HPI:  Anxiety: Michelle Arnold is a 53 y.o. female present for anxiety follow up. She is doing "extremely well." She is taking Effexor 150 mg.   Prior note: She presents today for follow-up after changing medications 1 month ago for increased anxiety.  See full note from June listed below for details.  Patient reports she feels better with the addition of Seroquel nightly.  Although improved she still feels her anxiety is higher than prior.  The Seroquel helps her sleep as well but it does make her quite drowsy. Prior note 09/09/2017 Patient presents today with increased anxiety.  Last visit in January she was doing very well on 150 mg Effexor daily.  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  She states that is getting resolved so.  She noticed her anxiety increased around February or March.  She is not sleeping well.  She feels "anxious inside".  She is not feeling herself.  She is not feeling motivated.  She feels like she has to "push herself "to even do minor things like cleaning the house.  States she feels "paralyzed".  Feels she is more irritable with her  grandchildren.  Her appetite is decreased again.  This occurs commonly for her when her anxiety is increased.  She has lost 10 pounds of the 15 she put on.  He has been on Wellbutrin and Remeron in the past without good results or side effects.  Reports this is been going on for couple months, but she decided to make the appointment today because she prayed about it and she felt the answer was to call here.  ROS: See pertinent positives and negatives per HPI.  Patient Active Problem List   Diagnosis Date Noted  . Polydipsia 01/11/2018  . Orthostatic dizziness 01/11/2018  . Osteopenia 08/04/2016  . Hidradenitis suppurativa 12/16/2011  . Migraine 11/09/2011  . Encounter for smoking cessation counseling 11/09/2011  . Hyperlipidemia   . Depression with anxiety   . CTS (carpal tunnel syndrome)     Social History   Tobacco Use  . Smoking status: Current Every Day Smoker    Packs/day: 1.00    Years: 30.00    Pack years: 30.00    Types: Cigarettes  . Smokeless tobacco: Never Used  Substance Use Topics  . Alcohol use: Yes    Comment: very rare    Current Outpatient Medications:  .  Ascorbic Acid (VITAMIN C) 1000 MG tablet, Take 1,000 mg by mouth daily. Two tabs daily, Disp: , Rfl:  .  calcium gluconate 500 MG tablet, Take 2 tablets by mouth daily., Disp: , Rfl:  .  Cetirizine HCl 10 MG CAPS, Take  by mouth., Disp: , Rfl:  .  Cholecalciferol (VITAMIN D3) 1000 units CAPS, Take 2 capsules by mouth daily., Disp: , Rfl:  .  Coenzyme Q10 (COQ-10) 100 MG CAPS, Take 2 capsules by mouth daily., Disp: , Rfl:  .  Multiple Vitamin (MULTIVITAMIN) tablet, Take 1 tablet by mouth daily., Disp: , Rfl:  .  SUPER B COMPLEX/C PO, Take 2 tablets by mouth daily., Disp: , Rfl:  .  TURMERIC CURCUMIN PO, Take 1,000 mg by mouth daily., Disp: , Rfl:  .  venlafaxine XR (EFFEXOR XR) 150 MG 24 hr capsule, Take 1 capsule (150 mg total) by mouth daily with breakfast. Needs office visit prior to anymore refills., Disp:  90 capsule, Rfl: 1 .  vitamin B-12 (CYANOCOBALAMIN) 1000 MCG tablet, Take 1,000 mcg by mouth daily., Disp: , Rfl:  .  rosuvastatin (CRESTOR) 10 MG tablet, Take 1 tablet (10 mg total) by mouth daily. (Patient not taking: Reported on 07/20/2018), Disp: 90 tablet, Rfl: 0 .  venlafaxine XR (EFFEXOR-XR) 75 MG 24 hr capsule, Take 1 capsule (75 mg total) by mouth daily with breakfast. 1 tab daily (75 mg), in addition to 150 mg cap for a total of 225 mg a day. (Patient not taking: Reported on 07/20/2018), Disp: 90 capsule, Rfl: 0  Allergies  Allergen Reactions  . Atorvastatin     Memory loss    OBJECTIVE: Ht 5\' 4"  (1.626 m)   LMP  (LMP Unknown)   BMI 18.88 kg/m  Gen: No acute distress. Nontoxic in appearance.  HENT: AT. [place].  MMM.  Neuro: Alert. Oriented x3  Psych: Normal affect, dress and demeanor. Normal speech. Normal thought content and judgment.  Depression screen [facility] 2/9 01/11/2018 10/10/2017 09/09/2017 04/20/2017 10/26/2016  Decreased Interest 3 2 3  0 0  Down, Depressed, Hopeless 3 2 3  0 0  PHQ - 2 Score 6 4 6  0 0  Altered sleeping 2 0 3 0 0  Tired, decreased energy 3 1 3  0 0  Change in appetite 3 2 3  0 0  Feeling bad or failure about yourself  1 2 3  0 0  Trouble concentrating 2 2 3  0 0  Moving slowly or fidgety/restless 0 0 0 0 0  Suicidal thoughts 0 0 0 0 0  PHQ-9 Score 17 11 21  0 0  Difficult doing work/chores Very difficult - Very difficult Somewhat difficult -   GAD 7 : Generalized Anxiety Score 01/11/2018 10/10/2017 09/09/2017 04/20/2017  Nervous, Anxious, on Edge 3 2 3 3   Control/stop worrying 2 1 3 3   Worry too much - different things 2 2 3 3   Trouble relaxing 3 1 3 1   Restless - 1 3 0  Easily annoyed or irritable 1 0 3 0  Afraid - awful might happen 3 3 3  0  Total GAD 7 Score - 10 21 10   Anxiety Difficulty Very difficult - Very difficult Not difficult at all    ASSESSMENT AND PLAN: [name] is a 53 y.o. female present for  Depression with anxiety - Doing great.  -  Continue effexor 150 mg QD- refills provided today - tried: effexor, zoloft, wellbutrin, remeron, seroquel - pt declined referral to psych - F/U 6 months on depression and anxiety  Schedule CPE w/ PAP in July.   > 15 minutes spent with patient, >50% of time spent face to face counseling    Felix PaciniRenee Tanyla Stege, DO 07/20/2018

## 2018-08-17 ENCOUNTER — Ambulatory Visit: Payer: Self-pay | Admitting: *Deleted

## 2018-08-17 ENCOUNTER — Encounter: Payer: Self-pay | Admitting: Family Medicine

## 2018-08-17 ENCOUNTER — Ambulatory Visit (INDEPENDENT_AMBULATORY_CARE_PROVIDER_SITE_OTHER): Payer: Self-pay | Admitting: Family Medicine

## 2018-08-17 VITALS — Temp 98.2°F | Ht 64.0 in

## 2018-08-17 DIAGNOSIS — J0100 Acute maxillary sinusitis, unspecified: Secondary | ICD-10-CM

## 2018-08-17 MED ORDER — DOXYCYCLINE HYCLATE 100 MG PO TABS: 100.0000 mg | ORAL_TABLET | Freq: Two times a day (BID) | ORAL | 0 refills | Status: DC

## 2018-08-17 NOTE — Telephone Encounter (Signed)
Pt called COVID line stating that for the past 1-2 weeks she has been having sinus pressure between eyes and her nose, fatigue; for the past week she has been having headaches; she also has chills that started pm 08/16/2018; the pt says that she is most concerned about her fatigue, and says that she may be run down; she has taken tylenol and sudafed which helped lessen her symptoms; she answers no to questions on [facility] corona virus eval; the pt also complains of productive cough and nasal secretions that are thick green, and, thick mucus in her throat; recommendations made per nurse triage protocol; she verbalizes understanding; pt transferred to [facility], for scheduling.    Reason for Disposition . [1] Sinus pain (not just congestion) AND [2] fever  Answer Assessment - Initial Assessment Questions 1. LOCATION: "Where does it hurt?"      Between eyes and 2. ONSET: "When did the sinus pain start?"  (e.g., hours, days)     2 weeks ago 3. SEVERITY: "How bad is the pain?"   (Scale 1-10; mild, moderate or severe)   - MILD (1-3): doesn't interfere with normal activities    - MODERATE (4-7): interferes with normal activities (e.g., work or school) or awakens from sleep   - SEVERE (8-10): excruciating pain and patient unable to do any normal activities        1-2 out of 10 4. RECURRENT SYMPTOM: "Have you ever had sinus problems before?" If so, ask: "When was the last time?" and "What happened that time?"      Yes; due to seasons changing 5. NASAL CONGESTION: "Is the nose blocked?" If so, ask, "Can you open it or must you breathe through the mouth?"     Yes, can breathe through mouth  6. NASAL DISCHARGE: "Do you have discharge from your nose?" If so ask, "What color?"    Thick green 7. FEVER: "Do you have a fever?" If so, ask: "What is it, how was it measured, and when did it start?"      99.0 on 5/13 and 98.2 on 5/14 8. OTHER SYMPTOMS: "Do you have any other symptoms?" (e.g., sore throat,  cough, earache, difficulty breathing)     Cough, chills 9. PREGNANCY: "Is there any chance you are pregnant?" "When was your last menstrual period?"     No, post menopause  Protocols used: SINUS PAIN OR CONGESTION-A-AH

## 2018-08-17 NOTE — Progress Notes (Signed)
VIRTUAL VISIT VIA VIDEO  I connected with Brytnee Forest Tsou on 08/17/18 at  9:40 AM EDT by a video enabled telemedicine application and verified that I am speaking with the correct person using two identifiers. Location patient: Home Location provider: [facility], Office Persons participating in the virtual visit: Patient, Dr. Claiborne Billings and R.Baker, LPN  I discussed the limitations of evaluation and management by telemedicine and the availability of in person appointments. The patient expressed understanding and agreed to proceed.   SUBJECTIVE Chief Complaint  Patient presents with  . Facial Pain    Pt complains she feels very fatigued, has thick green drainage, fever/chills started last night. Taking OTC Sudafed and allergy medication.   . Nasal Congestion  . Cough  . Fever    HPI:   ROS: See pertinent positives and negatives per HPI.  Patient Active Problem List   Diagnosis Date Noted  . Allergy to statin medication 07/20/2018  . Polydipsia 01/11/2018  . Orthostatic dizziness 01/11/2018  . Osteopenia 08/04/2016  . Hidradenitis suppurativa 12/16/2011  . Migraine 11/09/2011  . Encounter for smoking cessation counseling 11/09/2011  . Hyperlipidemia   . Depression with anxiety   . CTS (carpal tunnel syndrome)     Social History   Tobacco Use  . Smoking status: Current Every Day Smoker    Packs/day: 1.00    Years: 30.00    Pack years: 30.00    Types: Cigarettes  . Smokeless tobacco: Never Used  Substance Use Topics  . Alcohol use: Yes    Comment: very rare    Current Outpatient Medications:  .  Ascorbic Acid (VITAMIN C) 1000 MG tablet, Take 1,000 mg by mouth daily. Two tabs daily, Disp: , Rfl:  .  calcium gluconate 500 MG tablet, Take 2 tablets by mouth daily., Disp: , Rfl:  .  Cetirizine HCl 10 MG CAPS, Take by mouth., Disp: , Rfl:  .  Cholecalciferol (VITAMIN D3) 1000 units CAPS, Take 2 capsules by mouth daily., Disp: , Rfl:  .  Coenzyme Q10 (COQ10) 100 MG  CAPS, Take 1 tablet by mouth daily., Disp: , Rfl:  .  Multiple Vitamin (MULTIVITAMIN) tablet, Take 1 tablet by mouth daily., Disp: , Rfl:  .  SUPER B COMPLEX/C PO, Take 2 tablets by mouth daily., Disp: , Rfl:  .  TURMERIC CURCUMIN PO, Take 1,000 mg by mouth daily., Disp: , Rfl:  .  venlafaxine XR (EFFEXOR XR) 150 MG 24 hr capsule, Take 1 capsule (150 mg total) by mouth daily with breakfast. ., Disp: 90 capsule, Rfl: 1 .  vitamin B-12 (CYANOCOBALAMIN) 1000 MCG tablet, Take 1,000 mcg by mouth daily., Disp: , Rfl:   Allergies  Allergen Reactions  . Atorvastatin     Memory loss  . Statins     OBJECTIVE: Temp 98.2 F (36.8 C) (Temporal)   Ht 5\' 4"  (1.626 m)   LMP  (LMP Unknown)   BMI 18.88 kg/m  Gen: Afebrile. No acute distress. Nontoxic in appearance.  HENT: AT. [place].  MMM.  Eyes:Pupils Equal Round Reactive to light, Extraocular movements intact,  Conjunctiva without redness, discharge or icterus. Chest: Cough or shortness of breath not present on exam.  Skin: no rashes, purpura or petechiae.  Neuro:  Alert. Oriented x3  Psych: Normal affect, dress and demeanor. Normal speech. Normal thought content and judgment.  ASSESSMENT AND PLAN: Michelle Arnold is a 53 y.o. female present for  1. Acute non-recurrent maxillary sinusitis Rest, hydrate.  continue syrtec,start ucinex (  DM if cough). Stop sudafed Doxy BID prescribed, take until completed.  If cough present it can last up to 6-8 weeks.  F/U 2 weeks of not improved.   > 15 minutes spent with patient, >50% of time spent face to face counseling   Felix PaciniRenee Layken Beg, DO 08/17/2018

## 2018-08-17 NOTE — Telephone Encounter (Signed)
Pt was scheduled for appt today and seen earlier this morning

## 2018-10-23 ENCOUNTER — Telehealth: Payer: Self-pay | Admitting: Family Medicine

## 2018-10-23 DIAGNOSIS — F418 Other specified anxiety disorders: Secondary | ICD-10-CM

## 2018-10-23 NOTE — Telephone Encounter (Signed)
Patient did not call pharmacy.  Advised patient that RX was sent there in April # 90 x 1 rf. Patient will contact them and have it filled.

## 2018-10-23 NOTE — Telephone Encounter (Signed)
Patient request refill  venlafaxine XR (EFFEXOR XR) 150 MG 24 hr capsule  [facility]  Patient has appt for CPE on 10/30/18

## 2018-10-23 NOTE — Telephone Encounter (Signed)
Last OV for depression / anxiety 07/20/2018 Next OV - CPE 10/30/2018  Please advise if okay to send in Effexor for patient.

## 2018-10-23 NOTE — Telephone Encounter (Signed)
She was provided with 6 mos total of scripts at her last office visit 4/16- she should not ned [place] to call in refills. Did she call her pharmacy?

## 2018-10-30 ENCOUNTER — Other Ambulatory Visit (HOSPITAL_COMMUNITY)
Admission: RE | Admit: 2018-10-30 | Discharge: 2018-10-30 | Disposition: A | Discharge status: Home / Self Care | Payer: No Typology Code available for payment source | Source: Ambulatory Visit | Attending: Family Medicine | Admitting: Family Medicine

## 2018-10-30 ENCOUNTER — Ambulatory Visit (INDEPENDENT_AMBULATORY_CARE_PROVIDER_SITE_OTHER): Payer: No Typology Code available for payment source | Admitting: Family Medicine

## 2018-10-30 ENCOUNTER — Other Ambulatory Visit: Payer: Self-pay

## 2018-10-30 ENCOUNTER — Encounter: Payer: Self-pay | Admitting: Family Medicine

## 2018-10-30 VITALS — BP 91/57 | HR 77 | Temp 98.4°F | Resp 17 | Ht 63.78 in | Wt 123.1 lb

## 2018-10-30 DIAGNOSIS — E782 Mixed hyperlipidemia: Secondary | ICD-10-CM | POA: Diagnosis not present

## 2018-10-30 DIAGNOSIS — Z79899 Other long term (current) drug therapy: Secondary | ICD-10-CM | POA: Diagnosis not present

## 2018-10-30 DIAGNOSIS — Z23 Encounter for immunization: Secondary | ICD-10-CM | POA: Diagnosis not present

## 2018-10-30 DIAGNOSIS — Z130 Encounter for screening for diseases of the blood and blood-forming organs and certain disorders involving the immune mechanism: Secondary | ICD-10-CM

## 2018-10-30 DIAGNOSIS — R238 Other skin changes: Secondary | ICD-10-CM

## 2018-10-30 DIAGNOSIS — Z131 Encounter for screening for diabetes mellitus: Secondary | ICD-10-CM

## 2018-10-30 DIAGNOSIS — M8580 Other specified disorders of bone density and structure, unspecified site: Secondary | ICD-10-CM | POA: Diagnosis not present

## 2018-10-30 DIAGNOSIS — Z0000 Encounter for general adult medical examination without abnormal findings: Secondary | ICD-10-CM

## 2018-10-30 DIAGNOSIS — Z0001 Encounter for general adult medical examination with abnormal findings: Secondary | ICD-10-CM

## 2018-10-30 DIAGNOSIS — Z1239 Encounter for other screening for malignant neoplasm of breast: Secondary | ICD-10-CM

## 2018-10-30 DIAGNOSIS — F418 Other specified anxiety disorders: Secondary | ICD-10-CM | POA: Diagnosis not present

## 2018-10-30 DIAGNOSIS — Z01419 Encounter for gynecological examination (general) (routine) without abnormal findings: Secondary | ICD-10-CM | POA: Diagnosis not present

## 2018-10-30 DIAGNOSIS — Z720 Tobacco use: Secondary | ICD-10-CM

## 2018-10-30 LAB — COMPREHENSIVE METABOLIC PANEL
ALT: 22 U/L (ref 0–35)
AST: 26 U/L (ref 0–37)
Albumin: 4.6 g/dL (ref 3.5–5.2)
Alkaline Phosphatase: 57 U/L (ref 39–117)
BUN: 13 mg/dL (ref 6–23)
CO2: 25 mEq/L (ref 19–32)
Calcium: 9.8 mg/dL (ref 8.4–10.5)
Chloride: 99 mEq/L (ref 96–112)
Creatinine, Ser: 0.65 mg/dL (ref 0.40–1.20)
GFR: 94.61 mL/min (ref >60.00)
Glucose, Bld: 99 mg/dL (ref 70–99)
Potassium: 5 mEq/L (ref 3.5–5.1)
Sodium: 133 mEq/L — ABNORMAL LOW (ref 135–145)
Total Bilirubin: 0.4 mg/dL (ref 0.2–1.2)
Total Protein: 7.2 g/dL (ref 6.0–8.3)

## 2018-10-30 LAB — HM PAP SMEAR

## 2018-10-30 LAB — CBC WITH DIFFERENTIAL/PLATELET
Basophils Absolute: 0.1 10*3/uL (ref 0.0–0.1)
Basophils Relative: 0.5 % (ref 0.0–3.0)
Eosinophils Absolute: 0.1 10*3/uL (ref 0.0–0.7)
Eosinophils Relative: 0.6 % (ref 0.0–5.0)
HCT: 41.2 % (ref 36.0–46.0)
Hemoglobin: 13.9 g/dL (ref 12.0–15.0)
Lymphocytes Relative: 23 % (ref 12.0–46.0)
Lymphs Abs: 3 10*3/uL (ref 0.7–4.0)
MCHC: 33.7 g/dL (ref 30.0–36.0)
MCV: 93.7 fl (ref 78.0–100.0)
Monocytes Absolute: 0.7 10*3/uL (ref 0.1–1.0)
Monocytes Relative: 5.5 % (ref 3.0–12.0)
Neutro Abs: 9.3 10*3/uL — ABNORMAL HIGH (ref 1.4–7.7)
Neutrophils Relative %: 70.4 % (ref 43.0–77.0)
Platelets: 341 10*3/uL (ref 150.0–400.0)
RBC: 4.4 Mil/uL (ref 3.87–5.11)
RDW: 13.1 % (ref 11.5–15.5)
WBC: 13.2 10*3/uL — ABNORMAL HIGH (ref 4.0–10.5)

## 2018-10-30 LAB — LIPID PANEL
Cholesterol: 276 mg/dL — ABNORMAL HIGH (ref 0–200)
HDL: 76.3 mg/dL (ref >39.00)
LDL Cholesterol: 169 mg/dL — ABNORMAL HIGH (ref 0–99)
NonHDL: 199.94
Total CHOL/HDL Ratio: 4
Triglycerides: 157 mg/dL — ABNORMAL HIGH (ref 0.0–149.0)
VLDL: 31.4 mg/dL (ref 0.0–40.0)

## 2018-10-30 LAB — TSH: TSH: 0.9 u[IU]/mL (ref 0.35–4.50)

## 2018-10-30 LAB — HEMOGLOBIN A1C: Hgb A1c MFr Bld: 5.8 % (ref 4.6–6.5)

## 2018-10-30 MED ORDER — VENLAFAXINE HCL ER 75 MG PO CP24: 75.0000 mg | ORAL_CAPSULE | Freq: Every day | ORAL | 1 refills | Status: DC

## 2018-10-30 NOTE — Progress Notes (Signed)
Patient ID: Michelle Arnold, female  DOB: 1966/03/21, 53 y.o.   MRN: [id number] Patient Care Team    Relationship Specialty Notifications Start End  Michelle Arnold, Renee A, DO PCP - General Family Medicine  11/03/15     Chief Complaint  Patient presents with  . Annual Exam    fasting. wants a pap today. she wants to talk about circulation because her hands go numb.   Marland Kitchen. Anxiety    wants to talk about increasing medication or changing medication    Subjective:  Michelle Arnold is a 53 y.o.  Female  present for CPE. All past medical history, surgical history, allergies, family history, immunizations, medications and social history were updated in the electronic medical record today. All recent labs, ED visits and hospitalizations within the last year were reviewed.  Skin irritation: Patient reports she has a skin lesion on the left side of her face that started out as a small pimple and she has continued to pick at the location.  She has concerns that is infected today.  Health maintenance:  Colonoscopy: completed 11/29/2013, by Dr. Lucretia RoersWood, resutls "normal" . follow up  10 years. Mammogram: 08/04/2016 at breast center.  3D mammogram ordered today. Cervical cancer screening: last pap: 12/2014, results: normal, - HPV, completed today. Immunizations: tdap 2013 UTD, Influenza UTD 2019 (encouraged yearly), PNA series started today with prevnar 13 (smoking).  Shingrix No. 1 provided today. Infectious disease screening: HIV and Hep C screenings completed DEXA: estrogen deficient, smoker, thin caucasian female.   Osteopenia -2.4, DEXA scan ordered today. Assistive device:none Oxygen WUJ:WJXBuse:none Patient has a Dental home. Hospitalizations/ED visits: reviewed  Depression/anxiety: Patient was seen in April and was doing rather well on current regimen.  However she has had a many life changes and stressors since that appointment.  She does not feel she is handling the pandemic stressors well either.  She would  like to try the increased dose of Effexor again if possible.  Prior note: She presents today for follow-up after changing medications 1 month ago for increased anxiety. See full note from June listed below for details. Patient reports she feels better with the addition of Seroquel nightly. Although improved she still feels her anxiety is higher than prior. The Seroquel helps her sleep as well but it does make her quite drowsy. Prior note 09/09/2017 Patient presents today with increased anxiety. Last visit in January she was doing very well on 150 mg Effexor daily. 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 She states that is getting resolved so. She noticed her anxiety increased around February or March. She is not sleeping well. She feels "anxious inside". She is not feeling herself. She is not feeling motivated. She feels like she has to "push herself "to even do minor things like cleaning the house. States she feels "paralyzed". Feels she is more irritable with her grandchildren. Her appetite is decreased again. This occurs commonly for her when her anxiety is increased. She has lost 10 pounds of the 15 she put on. He has been on Wellbutrin and Remeron in the past without good results or side effects. Reports this is been going on for couple months, but she decided to make the appointment today because she prayed about it and she felt the answer was to call here.  Depression screen [facility] 2/9 10/30/2018 10/30/2018 01/11/2018 10/10/2017 09/09/2017  Decreased Interest 1 1 3 2 3   Down,  Depressed, Hopeless 1 1 3 2 3   PHQ - 2 Score 2 2 6 4 6   Altered sleeping 1 1 2  0 3  Tired, decreased energy 1 1 3 1 3   Change in appetite 2 2 3 2 3   Feeling bad or failure about yourself  2 2 1 2 3   Trouble concentrating 3 3 2 2 3   Moving slowly or fidgety/restless 2 2 0 0 0  Suicidal thoughts 0 0 0 0 0  PHQ-9 Score 13 13 17 11 21    Difficult doing work/chores Very difficult Very difficult Very difficult - Very difficult   GAD 7 : Generalized Anxiety Score 10/30/2018 01/11/2018 10/10/2017 09/09/2017  Nervous, Anxious, on Edge 3 3 2 3   Control/stop worrying 2 2 1 3   Worry too much - different things 3 2 2 3   Trouble relaxing 3 3 1 3   Restless 3 - 1 3  Easily annoyed or irritable 2 1 0 3  Afraid - awful might happen 3 3 3 3   Total GAD 7 Score 19 - 10 21  Anxiety Difficulty Very difficult Very difficult - Very difficult     Immunization History  Administered Date(s) Administered  . Influenza Split 02/04/2012  . Influenza Whole 01/04/2011  . Influenza,inj,Quad PF,6+ Mos 12/29/2015, 02/02/2017, 01/11/2018  . Pneumococcal Conjugate-13 06/18/2016  . Tdap 11/09/2011  . Zoster Recombinat (Shingrix) 10/30/2018     Past Medical History:  Diagnosis Date  . Allergy   . Chicken pox as a child  . CTS (carpal tunnel syndrome)    right  . Depression with anxiety   . Dermatitis 11/09/2011   Right arm  . Fatigue 11/09/2011  . Hidradenitis suppurativa 12/16/2011  . Hyperlipidemia   . Measles as a child  . Migraine 11/09/2011  . Mumps as a child  . 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 Perimenopausal   . Tobacco abuse 11/09/2011  . Vitamin D deficiency    Allergies  Allergen Reactions  . Atorvastatin     Memory loss  . Statins    Past Surgical History:  Procedure Laterality Date  . LEEP  2004   Family History  Problem Relation Age of Onset  . Diabetes Mother        ?  Marland Kitchen. Osteoporosis Mother   . Hyperlipidemia Father   . Heart disease Father        MI in [id number]  . Hypertension Father   . Heart attack Father [id number]  . Cancer Maternal Grandmother        lung- smoker  . Osteoporosis Maternal Grandmother   . Cancer Maternal Grandfather        prostate  . Cataracts Paternal Grandmother   . Other Paternal Grandfather        PAD with  gangrene  . Breast cancer Maternal Aunt    Social History   Social History Narrative   Married. Has children.    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    Everyday smoker. Occasional alcohol. No drugs.     Allergies as of 10/30/2018      Reactions   Atorvastatin    Memory loss   Statins       Medication List       Accurate as of October 30, 2018 11:59 PM. If you have any questions, ask your nurse or doctor.        STOP taking these medications  doxycycline 100 MG tablet Commonly known as: VIBRA-TABS Stopped by: Howard Pouch, DO     TAKE these medications   calcium gluconate 500 MG tablet Take 2 tablets by mouth daily.   Cetirizine HCl 10 MG Caps Take by mouth.   CoQ10 100 MG Caps Take 1 tablet by mouth daily.   multivitamin tablet Take 1 tablet by mouth daily.   SUPER B COMPLEX/C PO Take 2 tablets by mouth daily.   TURMERIC CURCUMIN PO Take 1,000 mg by mouth daily.   venlafaxine XR 150 MG 24 hr capsule Commonly known as: Effexor XR Take 1 capsule (150 mg total) by mouth daily with breakfast. . What changed: Another medication with the same name was added. Make sure you understand how and when to take each. Changed by: Howard Pouch, DO   venlafaxine XR 75 MG 24 hr capsule Commonly known as: Effexor XR Take 1 capsule (75 mg total) by mouth daily with breakfast. What changed: You were already taking a medication with the same name, and this prescription was added. Make sure you understand how and when to take each. Changed by: Howard Pouch, DO   vitamin B-12 1000 MCG tablet Commonly known as: CYANOCOBALAMIN Take 1,000 mcg by mouth daily.   vitamin C 1000 MG tablet Take 1,000 mg by mouth daily. Two tabs daily   Vitamin D3 25 MCG (1000 UT) Caps Take 2 capsules by mouth daily.       All past medical history, surgical history, allergies, family history, immunizations andmedications were updated in the EMR today and reviewed under the history and medication portions  of their EMR.     Recent Results (from the past 2160 hour(s))  Cytology - PAP     Status: None   Collection Time: 10/30/18 12:00 AM  Result Value Ref Range   Adequacy      Satisfactory for evaluation. The presence or absence of an endocervical / transformation zone component cannot be determined because of atrophy.   Diagnosis      NEGATIVE FOR INTRAEPITHELIAL LESIONS OR MALIGNANCY.   HPV NOT Detected     Comment: Normal Reference Range - NOT Detected   Material Submitted CervicoVaginal Pap [ThinPrep Imaged]    CYTOLOGY - PAP PAP RESULT   CBC with Differential/Platelet     Status: Abnormal   Collection Time: 10/30/18  8:49 AM  Result Value Ref Range   WBC 13.2 (H) 4.0 - 10.5 K/uL   RBC 4.40 3.87 - 5.11 Mil/uL   Hemoglobin 13.9 12.0 - 15.0 g/dL   HCT 41.2 36.0 - 46.0 %   MCV 93.7 78.0 - 100.0 fl   MCHC 33.7 30.0 - 36.0 g/dL   RDW 13.1 11.5 - 15.5 %   Platelets 341.0 150.0 - 400.0 K/uL   Neutrophils Relative % 70.4 43.0 - 77.0 %   Lymphocytes Relative 23.0 12.0 - 46.0 %   Monocytes Relative 5.5 3.0 - 12.0 %   Eosinophils Relative 0.6 0.0 - 5.0 %   Basophils Relative 0.5 0.0 - 3.0 %   Neutro Abs 9.3 (H) 1.4 - 7.7 K/uL   Lymphs Abs 3.0 0.7 - 4.0 K/uL   Monocytes Absolute 0.7 0.1 - 1.0 K/uL   Eosinophils Absolute 0.1 0.0 - 0.7 K/uL   Basophils Absolute 0.1 0.0 - 0.1 K/uL  Comprehensive metabolic panel     Status: Abnormal   Collection Time: 10/30/18  8:49 AM  Result Value Ref Range   Sodium 133 (L) 135 - 145 mEq/L  Potassium 5.0 3.5 - 5.1 mEq/L   Chloride 99 96 - 112 mEq/L   CO2 25 19 - 32 mEq/L   Glucose, Bld 99 70 - 99 mg/dL   BUN 13 6 - 23 mg/dL   Creatinine, Ser 1.610.65 0.40 - 1.20 mg/dL   Total Bilirubin 0.4 0.2 - 1.2 mg/dL   Alkaline Phosphatase 57 39 - 117 U/L   AST 26 0 - 37 U/L   ALT 22 0 - 35 U/L   Total Protein 7.2 6.0 - 8.3 g/dL   Albumin 4.6 3.5 - 5.2 g/dL   Calcium 9.8 8.4 - 09.610.5 mg/dL   GFR 04.5494.61 >09.81>60.00 mL/min  Hemoglobin A1c     Status: None   Collection  Time: 10/30/18  8:49 AM  Result Value Ref Range   Hgb A1c MFr Bld 5.8 4.6 - 6.5 %    Comment: Glycemic Control Guidelines for People with Diabetes:Non Diabetic:  <6%Goal of Therapy: <7%Additional Action Suggested:  >8%   Lipid panel     Status: Abnormal   Collection Time: 10/30/18  8:49 AM  Result Value Ref Range   Cholesterol 276 (H) 0 - 200 mg/dL    Comment: ATP III Classification       Desirable:  < 200 mg/dL               Borderline High:  200 - 239 mg/dL          High:  > = 191240 mg/dL   Triglycerides 478.2157.0 (H) 0.0 - 149.0 mg/dL    Comment: Normal:  <956<150 mg/dLBorderline High:  150 - 199 mg/dL   HDL 21.3076.30 >86.57>39.00 mg/dL   VLDL 84.631.4 0.0 - 96.240.0 mg/dL   LDL Cholesterol 952169 (H) 0 - 99 mg/dL   Total CHOL/HDL Ratio 4     Comment:                Men          Women1/2 Average Risk     3.4          3.3Average Risk          5.0          4.42X Average Risk          9.6          7.13X Average Risk          15.0          11.0                       NonHDL 199.94     Comment: NOTE:  Non-HDL goal should be 30 mg/dL higher than patient's LDL goal (i.e. LDL goal of < 70 mg/dL, would have non-HDL goal of < 100 mg/dL)  TSH     Status: None   Collection Time: 10/30/18  8:49 AM  Result Value Ref Range   TSH 0.90 0.35 - 4.50 uIU/mL     ROS: 14 pt review of systems performed and negative (unless mentioned in an HPI)  Objective: BP (!) 91/57 (BP Location: Left Arm, Patient Position: Sitting, Cuff Size: Normal)   Pulse 77   Temp 98.4 F (36.9 C) (Temporal)   Resp 17   Ht 5' 3.78" (1.62 m)   Wt 123 lb 2 oz (55.8 kg)   LMP  (LMP Unknown)   SpO2 97%   BMI 21.28 kg/m  Gen: Afebrile. No acute distress. Nontoxic in appearance, well-developed, well-nourished, pleasant, thin, Caucasian  female. HENT: AT. [place]. Bilateral TM visualized and normal in appearance, normal external auditory canal. MMM, no oral lesions, adequate dentition. Bilateral nares within normal limits. Throat without erythema, ulcerations or  exudates.  No cough on exam, no hoarseness on exam. Eyes:Pupils Equal Round Reactive to light, Extraocular movements intact,  Conjunctiva without redness, discharge or icterus. Neck/lymp/endocrine: Supple, no lymphadenopathy, no thyromegaly CV: RRR no murmur, no edema, +2/4 P posterior tibialis pulses.  No carotid bruits. No JVD. Chest: CTAB, no wheeze, rhonchi or crackles.  Normal respiratory effort.  Good air movement. Abd: Soft.  Flat. NTND. BS present.  No masses palpated. No hepatosplenomegaly. No rebound tenderness or guarding. Skin: Small area of ulceration left face-no redness or drainage.  No rashes, purpura or petechiae. Warm and well-perfused. Skin intact. Neuro/Msk:  Normal gait. PERLA. EOMi. Alert. Oriented x3.  Cranial nerves II through XII intact. Muscle strength 5/5 upper/lower extremity. DTRs equal bilaterally. Psych: Anxious, otherwise normal affect, dress and demeanor. Normal speech. Normal thought content and judgment. Breasts: breasts appear normal, symmetrical, no tenderness on exam, no suspicious masses, no skin or nipple changes or axillary nodes. GYN:  External genitalia within normal limits, normal hair distribution, no lesions. Urethral meatus normal, no lesions. Vaginal mucosa pink, moist, normal rugae, no lesions. No cystocele or rectocele. cervix without lesions, no discharge. Bimanual exam revealed normal uterus.  No bladder/suprapubic fullness, masses or tenderness. No cervical motion tenderness. No adnexal fullness. Anus and perineum within normal limits, no lesions.  No exam data present  Assessment/plan: Michelle Arnold is a 53 y.o. female present for CPE Mixed hyperlipidemia/everyday smoker - Comprehensive metabolic panel - Lipid panel -Family history of heart disease and she is a smoker. -Smoking cessation encouraged Depression with anxiety -Patient again with increased anxiety today.  She was seen a few months ago and was doing well on current regimen but  there has been a lot of life changes since that time.  She would like to try to increase her Effexor to 225 mg total daily. - TSH -Discussed in length trying to increase exercise at least walking daily will also help with anxiety.  Taking time for herself.  Reading a book. -Discussion on COVID-19 and pandemic were provided to her. - tried: Careers information officer, zoloft, wellbutrin, remeron, seroquel - pt declined referral to psych -Follow-up 3 months Osteopenia, unspecified location Osteopenia in 2018, bone density -2.4 at that time - DG Bone Density; Future Screening for deficiency anemia - CBC with Differential/Platelet Encounter for long-term current use of medication - Comprehensive metabolic panel Diabetes mellitus screening - Hemoglobin A1c Encounter for cervical Pap smear with pelvic exam - Cytology - PAP completed today with co-test Need for shingles vaccine - Varicella-zoster vaccine IM-Shingrix No. 2 at next anxiety follow-up in 3 months. Breast cancer screening - MM 3D SCREEN BREAST BILATERAL; Future Encounter for preventative health examination: Patient was encouraged to exercise greater than 150 minutes a week. Patient was encouraged to choose a diet filled with fresh fruits and vegetables, and lean meats. AVS provided to patient today for education/recommendation on gender specific health and safety maintenance. Colonoscopy: completed 11/29/2013, by Dr. Lucretia Roers, resutls "normal" . follow up  10 years. Mammogram: 08/04/2016 at breast center.  3D mammogram ordered today. Cervical cancer screening: last pap: 12/2014, results: normal, - HPV, completed today. Immunizations: tdap 2013 UTD, Influenza UTD 2019 (encouraged yearly), PNA series started today with prevnar 13 (smoking).  Shingrix No. 1 provided today. Infectious disease screening: HIV and Hep C screenings completed DEXA:  estrogen deficient, smoker, thin caucasian female.   Osteopenia -2.4, DEXA scan ordered today.  Return in about 1 year  (around 10/30/2019) for CPE (30 min).  Follow-up 3 months on depression and anxiety and Shingrix No. 2 Patient was encouraged to make a nurse appointment in September for flu vaccination  CPE completed plus additional > 25 minutes spent with patient, >50% of time spent face to face discussing acute concerns, coronavirus pandemic and her worsening depression and anxiety.    Electronically signed by: Felix Pacini, DO [facility]

## 2018-10-30 NOTE — Patient Instructions (Signed)
Health Maintenance, Female Adopting a healthy lifestyle and getting preventive care are important in promoting health and wellness. Ask your health care provider about:  The right schedule for you to have regular tests and exams.  Things you can do on your own to prevent diseases and keep yourself healthy. What should I know about diet, weight, and exercise? Eat a healthy diet   Eat a diet that includes plenty of vegetables, fruits, low-fat dairy products, and lean protein.  Do not eat a lot of foods that are high in solid fats, added sugars, or sodium. Maintain a healthy weight Body mass index (BMI) is used to identify weight problems. It estimates body fat based on height and weight. Your health care provider can help determine your BMI and help you achieve or maintain a healthy weight. Get regular exercise Get regular exercise. This is one of the most important things you can do for your health. Most adults should:  Exercise for at least 150 minutes each week. The exercise should increase your heart rate and make you sweat (moderate-intensity exercise).  Do strengthening exercises at least twice a week. This is in addition to the moderate-intensity exercise.  Spend less time sitting. Even light physical activity can be beneficial. Watch cholesterol and blood lipids Have your blood tested for lipids and cholesterol at 55 years of age, then have this test every 5 years. Have your cholesterol levels checked more often if:  Your lipid or cholesterol levels are high.  You are older than 55 years of age.  You are at high risk for heart disease. What should I know about cancer screening? Depending on your health history and family history, you may need to have cancer screening at various ages. This may include screening for:  Breast cancer.  Cervical cancer.  Colorectal cancer.  Skin cancer.  Lung cancer. What should I know about heart disease, diabetes, and high blood  pressure? Blood pressure and heart disease  High blood pressure causes heart disease and increases the risk of stroke. This is more likely to develop in people who have high blood pressure readings, are of African descent, or are overweight.  Have your blood pressure checked: ? Every 3-5 years if you are 18-39 years of age. ? Every year if you are 40 years old or older. Diabetes Have regular diabetes screenings. This checks your fasting blood sugar level. Have the screening done:  Once every three years after age 40 if you are at a normal weight and have a low risk for diabetes.  More often and at a younger age if you are overweight or have a high risk for diabetes. What should I know about preventing infection? Hepatitis B If you have a higher risk for hepatitis B, you should be screened for this virus. Talk with your health care provider to find out if you are at risk for hepatitis B infection. Hepatitis C Testing is recommended for:  Everyone born from 1945 through 1965.  Anyone with known risk factors for hepatitis C. Sexually transmitted infections (STIs)  Get screened for STIs, including gonorrhea and chlamydia, if: ? You are sexually active and are younger than 55 years of age. ? You are older than 55 years of age and your health care provider tells you that you are at risk for this type of infection. ? Your sexual activity has changed since you were last screened, and you are at increased risk for chlamydia or gonorrhea. Ask your health care provider if   you are at risk.  Ask your health care provider about whether you are at high risk for HIV. Your health care provider may recommend a prescription medicine to help prevent HIV infection. If you choose to take medicine to prevent HIV, you should first get tested for HIV. You should then be tested every 3 months for as long as you are taking the medicine. Pregnancy  If you are about to stop having your period (premenopausal) and  you may become pregnant, seek counseling before you get pregnant.  Take 400 to 800 micrograms (mcg) of folic acid every day if you become pregnant.  Ask for birth control (contraception) if you want to prevent pregnancy. Osteoporosis and menopause Osteoporosis is a disease in which the bones lose minerals and strength with aging. This can result in bone fractures. If you are 65 years old or older, or if you are at risk for osteoporosis and fractures, ask your health care provider if you should:  Be screened for bone loss.  Take a calcium or vitamin D supplement to lower your risk of fractures.  Be given hormone replacement therapy (HRT) to treat symptoms of menopause. Follow these instructions at home: Lifestyle  Do not use any products that contain nicotine or tobacco, such as cigarettes, e-cigarettes, and chewing tobacco. If you need help quitting, ask your health care provider.  Do not use street drugs.  Do not share needles.  Ask your health care provider for help if you need support or information about quitting drugs. Alcohol use  Do not drink alcohol if: ? Your health care provider tells you not to drink. ? You are pregnant, may be pregnant, or are planning to become pregnant.  If you drink alcohol: ? Limit how much you use to 0-1 drink a day. ? Limit intake if you are breastfeeding.  Be aware of how much alcohol is in your drink. In the [place] one drink equals one 12 oz bottle of beer (355 mL), one 5 oz glass of wine (148 mL), or one 1 oz glass of hard liquor (44 mL). General instructions  Schedule regular health, dental, and eye exams.  Stay current with your vaccines.  Tell your health care provider if: ? You often feel depressed. ? You have ever been abused or do not feel safe at home. Summary  Adopting a healthy lifestyle and getting preventive care are important in promoting health and wellness.  Follow your health care provider's instructions about healthy  diet, exercising, and getting tested or screened for diseases.  Follow your health care provider's instructions on monitoring your cholesterol and blood pressure. This information is not intended to replace advice given to you by your health care provider. Make sure you discuss any questions you have with your health care provider. Document Released: 10/05/2010 Document Revised: 03/15/2018 Document Reviewed: 03/15/2018 Elsevier Patient Education  2020 [employer]  

## 2018-10-31 ENCOUNTER — Encounter: Payer: Self-pay | Admitting: Family Medicine

## 2018-10-31 ENCOUNTER — Telehealth: Payer: Self-pay | Admitting: Family Medicine

## 2018-10-31 DIAGNOSIS — R238 Other skin changes: Secondary | ICD-10-CM | POA: Insufficient documentation

## 2018-10-31 DIAGNOSIS — Z720 Tobacco use: Secondary | ICD-10-CM | POA: Insufficient documentation

## 2018-10-31 LAB — CYTOLOGY - PAP
Diagnosis: NEGATIVE
HPV: NOT DETECTED

## 2018-10-31 NOTE — Telephone Encounter (Signed)
Please inform patient the following information: Her PAP is normal.  Repeat 3-5 years. Her sodium was mildly low- I would encourage her to add a coconut water or sports drink to her daily regimen. Her cholesterol is rather elevated.  Total cholesterol 276 (goal less than 200), triglycerides 157 which is about at goal, HDL 76 which is great, LDL 169-this is high and goal would be less than 130 and closer to 100 with her family history.   -She has not been able to tolerate any statin medications.   -Therefore would recommend mediterranean diet and regular exercise.  A mediterranean diet is high in fruits, vegetables, whole grains, fish, chicken, nuts, healthy fats (olive oil or canola oil). Low fat dairy. There are many online resources and books on this diet. Limit butter, margarine, red meat and sweets.     -Stop smoking.  She is able to stop smoking she reduces her cardiovascular risk tremendously.    -There is also injectable medications to help lower cholesterol.  These are injected once monthly by the patient at home.  If she would like to start with these medications we can start the prior authorization form for her.  We would also have her instructed by a nurse visit on proper injection technique.  Please advise

## 2018-11-01 ENCOUNTER — Telehealth: Payer: Self-pay | Admitting: Family Medicine

## 2018-11-01 ENCOUNTER — Encounter: Payer: Self-pay | Admitting: Family Medicine

## 2018-11-01 NOTE — Telephone Encounter (Signed)
Patient was advised and  verbalized understanding regarding PCP recommendations.

## 2018-11-01 NOTE — Telephone Encounter (Signed)
Pt was called and given information. She verbalized understanding. She would like to try diet and exercise for 3-6 months and the recheck lipid panel before starting the injection. If diet and exercise do not work she will take the injection. Labs mailed to patient per request.

## 2018-11-01 NOTE — Telephone Encounter (Signed)
Most people have a sore arm after they get Shingrix.  Some people have redness and swelling on their arm spanning several inches where they got the shot and  felt tired or experienced muscle pain, a headache, shivering, fever, stomach pain, or nausea.   If a patient has a reaction to the first dose of Shingrix it does not necessarily mean they will have a reaction to the second dose. . The CDC recommends patients to complete the series even if they experienced  reaction to the first dose of Shingrix, such as she had. I suggest patients that had reaction to the 1st injection to take over-the-counter ibuprofen or acetaminophen prior to the appt for 2nd injection.  Remember that the pain from shingles can last a lifetime, and these side effects should only last 2 to 3 days.

## 2018-11-01 NOTE — Telephone Encounter (Signed)
Pt was called and denies fever, no drainage from the wound, Pt is feeling much better today. Pt was advised to use compress on arm to help with the pain and could take OTC pain relief if needed for soreness if needed. Pt states it was not hurting that bad but she did need to know whether to get the second vaccine  Please advise

## 2018-11-01 NOTE — Telephone Encounter (Signed)
Allergic reaction to shringrix from 7/27 visit with Dr. Raoul Pitch  Patient reports: Yesterday patient was very fatigued, could hardly keep her eyes open. Chills. Today she feels fine. Injection site is very swollen, hot, looks like a big red circle  Please advise and call patient  Thank you

## 2018-12-14 ENCOUNTER — Telehealth: Payer: Self-pay | Admitting: Family Medicine

## 2018-12-14 NOTE — Telephone Encounter (Signed)
Patient called and told me that she was hesitant to go to the hospital/BH because she was exposed to someone who was exposed to [place]. I told the patient that she still needed to go to [facility]. Patient stated that she only had one "moment" and didn't need to go. I explained that it will be awhile before her family gets their COVID results and the hospital is fully prepared for situations like this. Patient agreed and is going to [facility].

## 2018-12-14 NOTE — Telephone Encounter (Signed)
Called and spoke with husband who states he found Pt wandering down gravel road last night barefoot.  Police spoke with family and patient and decided she would be better staying at home. Pt has mentioned to mother about suicide recenty but not to spouse. Went to bed last night and was up this AM to drink coffee but has since went back to bed. Husband was given crisis numbers and told that pt needs to be taken to [facility]. He is asking not to do that and would like to bring her to see Dr Raoul Pitch if possible. Spouse was educated on [facility] might be better for wife since she is suicidal and he said he did not want to do that and pt would never agree to that but he could probably get her to come and see Dr Raoul Pitch. Pts spouse was getting upset on the phone stating "he has never seen her this way and he could not convince her to go to the hospital and to please ask Dr Raoul Pitch if he can bring her to the office". Message sent to Dr Raoul Pitch.

## 2018-12-14 NOTE — Telephone Encounter (Signed)
Patient went back to bed this morning however had to have 911 called when she wandered down the road last night. Husband feels she has had a nervous breakdown. Patient's husband states him & her are supposed to go on a vacation tomorrow for the week by themselves which they haven't done in years. He wants her to see Dr. Raoul Pitch before they leave however she refuses. He states he will make her if the doctor feels he should. Please call.

## 2018-12-14 NOTE — Telephone Encounter (Signed)
SW patients husband, Sonia Side.  Advised Sonia Side the most suitable treatment for patient is [facility]. Sonia Side verbalized understanding, stated he will take her there today when she wakes up.

## 2018-12-14 NOTE — Telephone Encounter (Signed)
Please call patient husband concerning recent issues with [facility]. I am very concerned about [name]. When a person demonstrates these types of behaviors such as the confusion with wandering and speaking of harming themselves- their condition needs more emergent care and supervision that can not be adequately supplied in primary office setting. Out of concern for her and her safety- I do recommend he take her to [facility] ED for emergent evaluation- it is understandable she will not agree to this- but he should take her regardless and if necessary call EMS to get her there if she is confused.    Of course I can see her- I have an opening tomorrow at 830, but I can not give her the supervision and psychiatry specialist training  she needs. I am happy to speak with her tomorrow and we can discuss med changes- but they have to understand we can only do so much here and it sounds like she needs a psychiatry evaluation for her safety.

## 2018-12-14 NOTE — Telephone Encounter (Signed)
Pts spouse was called and message was left to return call

## 2018-12-15 ENCOUNTER — Telehealth: Payer: Self-pay | Admitting: Family Medicine

## 2018-12-15 NOTE — Telephone Encounter (Signed)
noted 

## 2018-12-15 NOTE — Telephone Encounter (Signed)
Please call patient regarding her going to [facility].  She has had indirect contact with someone who has tested positive with COVID-19. Her family is now in quarantine until Monday which is the day her COVID testing will come back  She reports she is fine at this time, no pain, clear headed. She states she is not "looney" and without any panic attacks.  Please call as soon as possible.  Thank you

## 2018-12-15 NOTE — Telephone Encounter (Signed)
LMOM for pt.  Advised pt in message that she can schedule VV with Dr Raoul Pitch to discuss everything that she is going through right now.

## 2018-12-15 NOTE — Telephone Encounter (Signed)
FYI - Patient called back and states that she would like DR Raoul Pitch to know that she is fine.  She did have two panic attacks yesterday but she said it was because everyone ( husband, mother ) kept coming at her, calling police and ambulance and they would just not give her any space.  She states she does not want to go to [facility] because she states she feels fine and because she is nervous to expose anyone to Covid ( that she may have been exposed to )  Patient seemed calm on the phone and like she had everything under control at this time.  Patient advised if she does have another panic attack or feel like she's going to have one to please report to [facility] and let them help her.  She states that she promises to go if she has another attack.

## 2018-12-28 ENCOUNTER — Other Ambulatory Visit: Payer: Self-pay

## 2018-12-28 ENCOUNTER — Ambulatory Visit (INDEPENDENT_AMBULATORY_CARE_PROVIDER_SITE_OTHER): Payer: No Typology Code available for payment source

## 2018-12-28 ENCOUNTER — Ambulatory Visit: Payer: No Typology Code available for payment source

## 2018-12-28 DIAGNOSIS — Z23 Encounter for immunization: Secondary | ICD-10-CM | POA: Diagnosis not present

## 2019-01-13 ENCOUNTER — Other Ambulatory Visit: Payer: Self-pay | Admitting: Family Medicine

## 2019-01-13 DIAGNOSIS — F418 Other specified anxiety disorders: Secondary | ICD-10-CM

## 2019-01-18 ENCOUNTER — Ambulatory Visit: Payer: No Typology Code available for payment source

## 2019-01-18 ENCOUNTER — Other Ambulatory Visit: Payer: No Typology Code available for payment source

## 2019-02-01 ENCOUNTER — Encounter: Payer: Self-pay | Admitting: Family Medicine

## 2019-02-01 ENCOUNTER — Ambulatory Visit (INDEPENDENT_AMBULATORY_CARE_PROVIDER_SITE_OTHER): Payer: No Typology Code available for payment source | Admitting: Family Medicine

## 2019-02-01 ENCOUNTER — Other Ambulatory Visit: Payer: Self-pay

## 2019-02-01 VITALS — BP 103/69 | HR 88 | Temp 98.2°F | Resp 18 | Ht 64.0 in | Wt 127.1 lb

## 2019-02-01 DIAGNOSIS — J302 Other seasonal allergic rhinitis: Secondary | ICD-10-CM

## 2019-02-01 DIAGNOSIS — Z720 Tobacco use: Secondary | ICD-10-CM

## 2019-02-01 DIAGNOSIS — E782 Mixed hyperlipidemia: Secondary | ICD-10-CM

## 2019-02-01 DIAGNOSIS — F418 Other specified anxiety disorders: Secondary | ICD-10-CM | POA: Diagnosis not present

## 2019-02-01 DIAGNOSIS — T7840XD Allergy, unspecified, subsequent encounter: Secondary | ICD-10-CM

## 2019-02-01 DIAGNOSIS — Z23 Encounter for immunization: Secondary | ICD-10-CM | POA: Diagnosis not present

## 2019-02-01 MED ORDER — VENLAFAXINE HCL ER 150 MG PO CP24: 150.0000 mg | ORAL_CAPSULE | Freq: Every day | ORAL | 1 refills | Status: DC

## 2019-02-01 MED ORDER — VENLAFAXINE HCL ER 75 MG PO CP24: 75.0000 mg | ORAL_CAPSULE | Freq: Every day | ORAL | 1 refills | Status: DC

## 2019-02-01 MED ORDER — FLUTICASONE PROPIONATE 50 MCG/ACT NA SUSP: 1.0000 | Freq: Every day | NASAL | 6 refills | Status: DC

## 2019-02-01 NOTE — Progress Notes (Signed)
Patient ID: EURIKA SANDY, female  DOB: 1965/05/29, 54 y.o.   MRN: [id number] Patient Care Team    Relationship Specialty Notifications Start End  Ma Hillock, DO PCP - General Family Medicine  11/03/15     Chief Complaint  Patient presents with  . Depression    Pt is doing okay with no complaints   . Anxiety    Subjective:  [facility] is a 53 y.o.  Female  present for [facility] follow up  Depression/anxiety: She is doing much better on the increased dose. She is adjusting better.  Feeling better. Not as overwhelmed.She has not started exercising yet- but reports she is going to start. She is still smoking daily and admits her smoking habit has increased.   She also complains of increased allergies, nasal drainage. No fever, chills, headache or cough.   Prior note: Patient was seen in April and was doing rather well on current regimen.  However she has had a many life changes and stressors since that appointment.  She does not feel she is handling the pandemic stressors well either.  She would like to try the increased dose of Effexor again if possible.  Prior note: She presents today for follow-up after changing medications 1 month ago for increased anxiety. See full note from June listed below for details. Patient reports she feels better with the addition of Seroquel nightly. Although improved she still feels her anxiety is higher than prior. The Seroquel helps her sleep as well but it does make her quite drowsy. Prior note 09/09/2017 Patient presents today with increased anxiety. Last visit in January she was doing very well on 150 mg Effexor daily. 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 She states that is getting resolved so. She noticed her anxiety increased around February or March. She is not sleeping well. She feels "anxious inside". She is not feeling herself. She is not feeling  motivated. She feels like she has to "push herself "to even do minor things like cleaning the house. States she feels "paralyzed". Feels she is more irritable with her grandchildren. Her appetite is decreased again. This occurs commonly for her when her anxiety is increased. She has lost 10 pounds of the 15 she put on. He has been on Wellbutrin and Remeron in the past without good results or side effects. Reports this is been going on for couple months, but she decided to make the appointment today because she prayed about it and she felt the answer was to call here.  Depression screen [facility] 2/9 02/01/2019 10/30/2018 10/30/2018 01/11/2018 10/10/2017  Decreased Interest 0 1 1 3 2   Down, Depressed, Hopeless 0 1 1 3 2   PHQ - 2 Score 0 2 2 6 4   Altered sleeping 1 1 1 2  0  Tired, decreased energy 1 1 1 3 1   Change in appetite 1 2 2 3 2   Feeling bad or failure about yourself  1 2 2 1 2   Trouble concentrating 0 3 3 2 2   Moving slowly or fidgety/restless 2 2 2  0 0  Suicidal thoughts 0 0 0 0 0  PHQ-9 Score 6 13 13 17 11   Difficult doing work/chores Somewhat difficult Very difficult Very difficult Very difficult -  Some recent data might be hidden   GAD 7 : Generalized Anxiety Score 02/01/2019 10/30/2018 01/11/2018 10/10/2017  Nervous, Anxious, on Edge 2 3 3  2  Control/stop worrying 2 2 2 1   Worry too much - different things 2 3 2 2   Trouble relaxing 2 3 3 1   Restless 2 3 - 1  Easily annoyed or irritable 2 2 1  0  Afraid - awful might happen 2 3 3 3   Total GAD 7 Score 14 19 - 10  Anxiety Difficulty Somewhat difficult Very difficult Very difficult -     Immunization History  Administered Date(s) Administered  . Influenza Split 02/04/2012  . Influenza Whole 01/04/2011  . Influenza,inj,Quad PF,6+ Mos 12/29/2015, 02/02/2017, 01/11/2018, 12/28/2018  . Pneumococcal Conjugate-13 06/18/2016  . Tdap 11/09/2011  . Zoster Recombinat (Shingrix) 10/30/2018, 02/01/2019     Past Medical History:   Diagnosis Date  . Allergy   . Chicken pox as a child  . CTS (carpal tunnel syndrome)    right  . Depression with anxiety   . Dermatitis 11/09/2011   Right arm  . Fatigue 11/09/2011  . Hidradenitis suppurativa 12/16/2011  . Hyperlipidemia   . Measles as a child  . Migraine 11/09/2011  . Mumps as a child  . 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 Perimenopausal   . Tobacco abuse 11/09/2011  . Vitamin D deficiency    Allergies  Allergen Reactions  . Atorvastatin     Memory loss  . Statins    Past Surgical History:  Procedure Laterality Date  . LEEP  2004   Family History  Problem Relation Age of Onset  . Diabetes Mother        ?  01/09/2012 Osteoporosis Mother   . Hyperlipidemia Father   . Heart disease Father        MI in 22  . Hypertension Father   . Heart attack Father 70  . Cancer Maternal Grandmother        lung- smoker  . Osteoporosis Maternal Grandmother   . Cancer Maternal Grandfather        prostate  . Cataracts Paternal Grandmother   . Other Paternal Grandfather        PAD with gangrene  . Breast cancer Maternal Aunt    Social History   Social History Narrative   Married. Has children.    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    Everyday smoker. Occasional alcohol. No drugs.     Allergies as of 02/01/2019      Reactions   Atorvastatin    Memory loss   Statins       Medication List       Accurate as of February 01, 2019  8:42 AM. If you have any questions, ask your nurse or doctor.        calcium gluconate 500 MG tablet Take 2 tablets by mouth daily.   Cetirizine HCl 10 MG Caps Take by mouth.   CoQ10 100 MG Caps Take 1 tablet by mouth daily.   fluticasone 50 MCG/ACT nasal spray Commonly known as: FLONASE Place 1 spray into both nostrils daily. Started by: Marland Kitchen, DO   multivitamin tablet Take 1 tablet by mouth daily.   SUPER B COMPLEX/C PO Take 2 tablets by  mouth daily.   TURMERIC CURCUMIN PO Take 1,000 mg by mouth daily.   venlafaxine XR 75 MG 24 hr capsule Commonly known as: Effexor XR Take 1 capsule (75 mg total) by mouth daily with breakfast.   venlafaxine XR 150 MG 24 hr capsule Commonly known as:  Effexor XR Take 1 capsule (150 mg total) by mouth daily with breakfast. .   vitamin B-12 1000 MCG tablet Commonly known as: CYANOCOBALAMIN Take 1,000 mcg by mouth daily.   vitamin C 1000 MG tablet Take 1,000 mg by mouth daily. Two tabs daily   Vitamin D3 25 MCG (1000 UT) Caps Take 2 capsules by mouth daily.       All past medical history, surgical history, allergies, family history, immunizations andmedications were updated in the EMR today and reviewed under the history and medication portions of their EMR.     No results found for this or any previous visit (from the past 2160 hour(s)).   ROS: 14 pt review of systems performed and negative (unless mentioned in an HPI)  Objective: BP 103/69 (BP Location: Left Arm, Patient Position: Sitting, Cuff Size: Normal)   Pulse 88   Temp 98.2 F (36.8 C) (Temporal)   Resp 18   Ht 5\' 4"  (1.626 m)   Wt 127 lb 2 oz (57.7 kg)   LMP  (LMP Unknown)   SpO2 97%   BMI 21.82 kg/m  Gen: Afebrile. No acute distress. Looks well today.  HENT: AT. Happy.  Eyes:Pupils Equal Round Reactive to light, Extraocular movements intact,  Conjunctiva without redness, discharge or icterus. CV: RRR  Chest: CTAB, no wheeze or crackles Neuro:  Normal gait. PERLA. EOMi. Alert. Oriented.  Psych: relaxed today. Normal affect, dress and demeanor. Normal speech. Normal thought content and judgment.   No exam data present  Assessment/plan: [facility] is a 53 y.o. female present for CPE everyday smoker/hyperlipidemia -Smoking cessation encouraged.  - mediterranean diet she has started thi since our last visit.  - intolerant to statin.  - discussed injectable. She wants to wait and work on above first. Could  consider zeita.  - labs next visit >> she would like to wait to add more exercise first.   Depression with anxiety - stable. Improved.  -Discussed in length trying to increase exercise awalking daily or start yoga (with her Mom) will also help with anxiety.  Taking time for herself.   - tried: Careers information officereffexor, zoloft, wellbutrin, remeron, seroquel - pt declined referral to psych -Follow-up 6 months   Allergy: flonase prescribed, continue allergy pill.    shingrix #2 provided today.   Return in about 6 months (around 08/02/2019).   > 25 minutes spent with patient, >50% of time spent face to face     Electronically signed by: Felix Pacinienee , DO [facility]

## 2019-02-01 NOTE — Patient Instructions (Addendum)
Great to see you today.  I have refilled your meds.  Follow up in 6 months.     Living With Anxiety  After being diagnosed with an anxiety disorder, you may be relieved to know why you have felt or behaved a certain way. It is natural to also feel overwhelmed about the treatment ahead and what it will mean for your life. With care and support, you can manage this condition and recover from it. How to cope with anxiety Dealing with stress Stress is your body's reaction to life changes and events, both good and bad. Stress can last just a few hours or it can be ongoing. Stress can play a major role in anxiety, so it is important to learn both how to cope with stress and how to think about it differently. Talk with your health care provider or a counselor to learn more about stress reduction. He or she may suggest some stress reduction techniques, such as:  Music therapy. This can include creating or listening to music that you enjoy and that inspires you.  Mindfulness-based meditation. This involves being aware of your normal breaths, rather than trying to control your breathing. It can be done while sitting or walking.  Centering prayer. This is a kind of meditation that involves focusing on a word, phrase, or sacred image that is meaningful to you and that brings you peace.  Deep breathing. To do this, expand your stomach and inhale slowly through your nose. Hold your breath for 3-5 seconds. Then exhale slowly, allowing your stomach muscles to relax.  Self-talk. This is a skill where you identify thought patterns that lead to anxiety reactions and correct those thoughts.  Muscle relaxation. This involves tensing muscles then relaxing them. Choose a stress reduction technique that fits your lifestyle and personality. Stress reduction techniques take time and practice. Set aside 5-15 minutes a day to do them. Therapists can offer training in these techniques. The training may be covered by  some insurance plans. Other things you can do to manage stress include:  Keeping a stress diary. This can help you learn what triggers your stress and ways to control your response.  Thinking about how you respond to certain situations. You may not be able to control everything, but you can control your reaction.  Making time for activities that help you relax, and not feeling guilty about spending your time in this way. Therapy combined with coping and stress-reduction skills provides the best chance for successful treatment. Medicines Medicines can help ease symptoms. Medicines for anxiety include:  Anti-anxiety drugs.  Antidepressants.  Beta-blockers. Medicines may be used as the main treatment for anxiety disorder, along with therapy, or if other treatments are not working. Medicines should be prescribed by a health care provider. Relationships Relationships can play a big part in helping you recover. Try to spend more time connecting with trusted friends and family members. Consider going to couples counseling, taking family education classes, or going to family therapy. Therapy can help you and others better understand the condition. How to recognize changes in your condition Everyone has a different response to treatment for anxiety. Recovery from anxiety happens when symptoms decrease and stop interfering with your daily activities at home or work. This may mean that you will start to:  Have better concentration and focus.  Sleep better.  Be less irritable.  Have more energy.  Have improved memory. It is important to recognize when your condition is getting worse. Contact your health  care provider if your symptoms interfere with home or work and you do not feel like your condition is improving. Where to find help and support: You can get help and support from these sources:  Self-help groups.  Online and [employer].  A trusted spiritual leader.  Couples  counseling.  Family education classes.  Family therapy. Follow these instructions at home:  Eat a healthy diet that includes plenty of vegetables, fruits, whole grains, low-fat dairy products, and lean protein. Do not eat a lot of foods that are high in solid fats, added sugars, or salt.  Exercise. Most adults should do the following: ? Exercise for at least 150 minutes each week. The exercise should increase your heart rate and make you sweat (moderate-intensity exercise). ? Strengthening exercises at least twice a week.  Cut down on caffeine, tobacco, alcohol, and other potentially harmful substances.  Get the right amount and quality of sleep. Most adults need 7-9 hours of sleep each night.  Make choices that simplify your life.  Take over-the-counter and prescription medicines only as told by your health care provider.  Avoid caffeine, alcohol, and certain over-the-counter cold medicines. These may make you feel worse. Ask your pharmacist which medicines to avoid.  Keep all follow-up visits as told by your health care provider. This is important. Questions to ask your health care provider  Would I benefit from therapy?  How often should I follow up with a health care provider?  How long do I need to take medicine?  Are there any long-term side effects of my medicine?  Are there any alternatives to taking medicine? Contact a health care provider if:  You have a hard time staying focused or finishing daily tasks.  You spend many hours a day feeling worried about everyday life.  You become exhausted by worry.  You start to have headaches, feel tense, or have nausea.  You urinate more than normal.  You have diarrhea. Get help right away if:  You have a racing heart and shortness of breath.  You have thoughts of hurting yourself or others. If you ever feel like you may hurt yourself or others, or have thoughts about taking your own life, get help right away. You  can go to your nearest emergency department or call:  Your local emergency services (911 in the [place]  A suicide crisis helpline, such as the National Suicide Prevention Lifeline at 8637071856. This is open 24-hours a day. Summary  Taking steps to deal with stress can help calm you.  Medicines cannot cure anxiety disorders, but they can help ease symptoms.  Family, friends, and partners can play a big part in helping you recover from an anxiety disorder. This information is not intended to replace advice given to you by your health care provider. Make sure you discuss any questions you have with your health care provider. Document Released: 03/16/2016 Document Revised: 03/04/2017 Document Reviewed: 03/16/2016 Elsevier Patient Education  2020 [employer]   Increase water/ electrolyte water (G2) to at least 70 ounces a day.  Please stop smoking.

## 2019-03-08 ENCOUNTER — Ambulatory Visit: Payer: No Typology Code available for payment source

## 2019-04-06 ENCOUNTER — Telehealth: Payer: Self-pay | Admitting: Internal Medicine

## 2019-04-06 NOTE — Telephone Encounter (Signed)
Juri was exposed to someone with COVID three days ago - she just found out today.  She has no symptoms.   She lives with her husband, mom, son and two grandkids and wants to know what to do as far as quarantining.    Discussed quarantining in her house.  Will get tested on Monday - she will schedule an appt at [facility].     She will call with any concerns.

## 2019-04-09 ENCOUNTER — Other Ambulatory Visit: Payer: No Typology Code available for payment source

## 2019-04-11 ENCOUNTER — Other Ambulatory Visit: Payer: No Typology Code available for payment source

## 2019-06-05 ENCOUNTER — Ambulatory Visit: Payer: No Typology Code available for payment source

## 2019-06-05 ENCOUNTER — Other Ambulatory Visit: Payer: No Typology Code available for payment source

## 2019-07-24 ENCOUNTER — Other Ambulatory Visit: Payer: Self-pay

## 2019-07-24 NOTE — Telephone Encounter (Signed)
Please call patient: We received a refill request for her Effexor.  Patient is due for her 25-month follow-up, the reason there is no refills on her medication.  These get her scheduled as soon as possible to follow-up on her chronic conditions.  Please  make sure  patient has enough medications to get her to an appointment with [place].   If she is running out or unable to get in before running out, we can provide a 30 d script of each Effexor 150 mg daily and Effexor 75 mg daily to bridge her.  If she has enough medications we will refill at the time of her appointment. Thanks.

## 2019-07-24 NOTE — Telephone Encounter (Signed)
Patient called in needing a refill on her medications   venlafaxine XR (EFFEXOR XR) 75 MG 24 hr capsule  venlafaxine XR (EFFEXOR XR) 150 MG 24 hr capsule  [facility] - [place], [place] - [address]    Please advise

## 2019-07-25 ENCOUNTER — Other Ambulatory Visit: Payer: Self-pay

## 2019-07-25 MED ORDER — VENLAFAXINE HCL ER 150 MG PO CP24: 150.0000 mg | ORAL_CAPSULE | Freq: Every day | ORAL | 0 refills | Status: DC

## 2019-07-25 MED ORDER — VENLAFAXINE HCL ER 75 MG PO CP24: 75.0000 mg | ORAL_CAPSULE | Freq: Every day | ORAL | 0 refills | Status: DC

## 2019-07-25 NOTE — Telephone Encounter (Signed)
Patient contacted regarding med refill and need for [facility]. She has been scheduled for 4/26, 30 d supply sent for both doses of effexor.

## 2019-07-30 ENCOUNTER — Ambulatory Visit: Payer: No Typology Code available for payment source | Admitting: Family Medicine

## 2019-07-30 DIAGNOSIS — Z0289 Encounter for other administrative examinations: Secondary | ICD-10-CM

## 2019-08-06 ENCOUNTER — Encounter: Payer: Self-pay | Admitting: Family Medicine

## 2019-08-06 ENCOUNTER — Ambulatory Visit (INDEPENDENT_AMBULATORY_CARE_PROVIDER_SITE_OTHER): Payer: No Typology Code available for payment source | Admitting: Family Medicine

## 2019-08-06 ENCOUNTER — Other Ambulatory Visit: Payer: Self-pay

## 2019-08-06 VITALS — BP 94/62 | HR 72 | Temp 98.1°F | Resp 16 | Ht 64.0 in | Wt 134.5 lb

## 2019-08-06 DIAGNOSIS — Z7189 Other specified counseling: Secondary | ICD-10-CM

## 2019-08-06 DIAGNOSIS — F418 Other specified anxiety disorders: Secondary | ICD-10-CM | POA: Diagnosis not present

## 2019-08-06 DIAGNOSIS — J302 Other seasonal allergic rhinitis: Secondary | ICD-10-CM

## 2019-08-06 DIAGNOSIS — E782 Mixed hyperlipidemia: Secondary | ICD-10-CM | POA: Diagnosis not present

## 2019-08-06 LAB — LIPID PANEL
Cholesterol: 283 mg/dL — ABNORMAL HIGH (ref 0–200)
HDL: 60.3 mg/dL (ref >39.00)
LDL Cholesterol: 187 mg/dL — ABNORMAL HIGH (ref 0–99)
NonHDL: 223.19
Total CHOL/HDL Ratio: 5
Triglycerides: 182 mg/dL — ABNORMAL HIGH (ref 0.0–149.0)
VLDL: 36.4 mg/dL (ref 0.0–40.0)

## 2019-08-06 MED ORDER — VENLAFAXINE HCL ER 75 MG PO CP24: 75.0000 mg | ORAL_CAPSULE | Freq: Every day | ORAL | 1 refills | Status: DC

## 2019-08-06 MED ORDER — CETIRIZINE HCL 10 MG PO TABS: 10.0000 mg | ORAL_TABLET | Freq: Every day | ORAL | 4 refills | Status: DC

## 2019-08-06 MED ORDER — VENLAFAXINE HCL ER 150 MG PO CP24: 150.0000 mg | ORAL_CAPSULE | Freq: Every day | ORAL | 1 refills | Status: DC

## 2019-08-06 NOTE — Patient Instructions (Addendum)
I have refilled your medications as well.   Start metamucil daily.   Follow up in 5.5 months   High Cholesterol  High cholesterol is a condition in which the blood has high levels of a white, waxy, fat-like substance (cholesterol). The human body needs small amounts of cholesterol. The liver makes all the cholesterol that the body needs. Extra (excess) cholesterol comes from the food that we eat. Cholesterol is carried from the liver by the blood through the blood vessels. If you have high cholesterol, deposits (plaques) may build up on the walls of your blood vessels (arteries). Plaques make the arteries narrower and stiffer. Cholesterol plaques increase your risk for heart attack and stroke. Work with your health care provider to keep your cholesterol levels in a healthy range. What increases the risk? This condition is more likely to develop in people who:  Eat foods that are high in animal fat (saturated fat) or cholesterol.  Are overweight.  Are not getting enough exercise.  Have a family history of high cholesterol. What are the signs or symptoms? There are no symptoms of this condition. How is this diagnosed? This condition may be diagnosed from the results of a blood test.  If you are older than age 21, your health care provider may check your cholesterol every 4-6 years.  You may be checked more often if you already have high cholesterol or other risk factors for heart disease. The blood test for cholesterol measures:  "Bad" cholesterol (LDL cholesterol). This is the main type of cholesterol that causes heart disease. The desired level for LDL is less than 100.  "Good" cholesterol (HDL cholesterol). This type helps to protect against heart disease by cleaning the arteries and carrying the LDL away. The desired level for HDL is 60 or higher.  Triglycerides. These are fats that the body can store or burn for energy. The desired number for triglycerides is lower than  150.  Total cholesterol. This is a measure of the total amount of cholesterol in your blood, including LDL cholesterol, HDL cholesterol, and triglycerides. A healthy number is less than 200. How is this treated? This condition is treated with diet changes, lifestyle changes, and medicines. Diet changes  This may include eating more whole grains, fruits, vegetables, nuts, and fish.  This may also include cutting back on red meat and foods that have a lot of added sugar. Lifestyle changes  Changes may include getting at least 40 minutes of aerobic exercise 3 times a week. Aerobic exercises include walking, biking, and swimming. Aerobic exercise along with a healthy diet can help you maintain a healthy weight.  Changes may also include quitting smoking. Medicines  Medicines are usually given if diet and lifestyle changes have failed to reduce your cholesterol to healthy levels.  Your health care provider may prescribe a statin medicine. Statin medicines have been shown to reduce cholesterol, which can reduce the risk of heart disease. Follow these instructions at home: Eating and drinking If told by your health care provider:  Eat chicken (without skin), fish, veal, shellfish, ground [place] breast, and round or loin cuts of red meat.  Do not eat fried foods or fatty meats, such as hot dogs and salami.  Eat plenty of fruits, such as apples.  Eat plenty of vegetables, such as broccoli, potatoes, and carrots.  Eat beans, peas, and lentils.  Eat grains such as barley, rice, couscous, and bulgur wheat.  Eat pasta without cream sauces.  Use skim or nonfat milk,  and eat low-fat or nonfat yogurt and cheeses.  Do not eat or drink whole milk, cream, ice cream, egg yolks, or hard cheeses.  Do not eat stick margarine or tub margarines that contain trans fats (also called partially hydrogenated oils).  Do not eat saturated tropical oils, such as coconut oil and palm oil.  Do not eat  cakes, cookies, crackers, or other baked goods that contain trans fats.  General instructions  Exercise as directed by your health care provider. Increase your activity level with activities such as gardening, walking, and taking the stairs.  Take over-the-counter and prescription medicines only as told by your health care provider.  Do not use any products that contain nicotine or tobacco, such as cigarettes and e-cigarettes. If you need help quitting, ask your health care provider.  Keep all follow-up visits as told by your health care provider. This is important. Contact a health care provider if:  You are struggling to maintain a healthy diet or weight.  You need help to start on an exercise program.  You need help to stop smoking. Get help right away if:  You have chest pain.  You have trouble breathing. This information is not intended to replace advice given to you by your health care provider. Make sure you discuss any questions you have with your health care provider. Document Revised: 03/25/2017 Document Reviewed: 09/20/2015 Elsevier Patient Education  2020 [employer].

## 2019-08-06 NOTE — Progress Notes (Signed)
Patient ID: Michelle Arnold, female  DOB: 18-May-1965, 54 y.o.   MRN: [id number] Patient Care Team    Relationship Specialty Notifications Start End  Ma Hillock, DO PCP - General Family Medicine  11/03/15     Chief Complaint  Patient presents with  . Anxiety    Pt needs refills on meds   . Depression  . Allergies    Pt has had congestion from allergies She has not taken Zyrtec but is taking flonase.     Subjective:  Michelle Arnold is a 54 y.o.  Female  present for [facility] follow up  Depression/anxiety: Patient reports she is feeling well on effexor 225 mg a day. Anxiety is well controlled.   Allergies: she has noticed her allergy symptoms have increased the last few days. She is taking her flonase, but did not start her zyrtec yet this year. She denies fever, chills, cough or headache. She did feel congested this weekend. She has questions about the covid vaccine.   Hyperlipidemia:  Her cholesterol panel was rather elevated with an LDL > 160 last visit. She has been on a statin in the past and reported memory issues on the medication. Her diet is naturally low in saturated/trans fats. She is a smoker.       Depression screen [facility] 2/9 08/06/2019 02/01/2019 10/30/2018 10/30/2018 01/11/2018  Decreased Interest 1 0 1 1 3   Down, Depressed, Hopeless 1 0 1 1 3   PHQ - 2 Score 2 0 2 2 6   Altered sleeping 0 1 1 1 2   Tired, decreased energy 3 1 1 1 3   Change in appetite 1 1 2 2 3   Feeling bad or failure about yourself  1 1 2 2 1   Trouble concentrating 3 0 3 3 2   Moving slowly or fidgety/restless 0 2 2 2  0  Suicidal thoughts 0 0 0 0 0  PHQ-9 Score 10 6 13 13 17   Difficult doing work/chores Somewhat difficult Somewhat difficult Very difficult Very difficult Very difficult  Some recent data might be hidden   GAD 7 : Generalized Anxiety Score 08/06/2019 02/01/2019 10/30/2018 01/11/2018  Nervous, Anxious, on Edge 0 2 3 3   Control/stop worrying 0 2 2 2   Worry too much - different things 1 2 3 2     Trouble relaxing 1 2 3 3   Restless 0 2 3 -  Easily annoyed or irritable 0 2 2 1   Afraid - awful might happen 0 2 3 3   Total GAD 7 Score 2 14 19  -  Anxiety Difficulty Not difficult at all Somewhat difficult Very difficult Very difficult     Immunization History  Administered Date(s) Administered  . Influenza Split 02/04/2012  . Influenza Whole 01/04/2011  . Influenza,inj,Quad PF,6+ Mos 12/29/2015, 02/02/2017, 01/11/2018, 12/28/2018  . Pneumococcal Conjugate-13 06/18/2016  . Tdap 11/09/2011  . Zoster Recombinat (Shingrix) 10/30/2018, 02/01/2019     Past Medical History:  Diagnosis Date  . Allergy   . Chicken pox as a child  . CTS (carpal tunnel syndrome)    right  . Depression with anxiety   . Dermatitis 11/09/2011   Right arm  . Fatigue 11/09/2011  . Hidradenitis suppurativa 12/16/2011  . Hyperlipidemia   . Measles as a child  . Migraine 11/09/2011  . Mumps as a child  . 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 Perimenopausal   . Tobacco abuse  11/09/2011  . Vitamin D deficiency    Allergies  Allergen Reactions  . Atorvastatin     Memory loss  . Statins    Past Surgical History:  Procedure Laterality Date  . LEEP  2004   Family History  Problem Relation Age of Onset  . Diabetes Mother        ?  Marland Kitchen Osteoporosis Mother   . Hyperlipidemia Father   . Heart disease Father        MI in 95  . Hypertension Father   . Heart attack Father 69  . Cancer Maternal Grandmother        lung- smoker  . Osteoporosis Maternal Grandmother   . Cancer Maternal Grandfather        prostate  . Cataracts Paternal Grandmother   . Other Paternal Grandfather        PAD with gangrene  . Breast cancer Maternal Aunt    Social History   Social History Narrative   Married. Has children.    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    Everyday smoker. Occasional alcohol. No drugs.     Allergies as of 08/06/2019       Reactions   Atorvastatin    Memory loss   Statins       Medication List       Accurate as of Aug 06, 2019  9:49 AM. If you have any questions, ask your nurse or doctor.        STOP taking these medications   Cetirizine HCl 10 MG Caps Replaced by: cetirizine 10 MG tablet Stopped by: Michelle Pacini, DO     TAKE these medications   calcium gluconate 500 MG tablet Take 2 tablets by mouth daily.   cetirizine 10 MG tablet Commonly known as: ZYRTEC Take 1 tablet (10 mg total) by mouth daily. Replaces: Cetirizine HCl 10 MG Caps Started by: Michelle Pacini, DO   CoQ10 100 MG Caps Take 1 tablet by mouth daily.   fluticasone 50 MCG/ACT nasal spray Commonly known as: FLONASE Place 1 spray into both nostrils daily.   multivitamin tablet Take 1 tablet by mouth daily.   SUPER B COMPLEX/C PO Take 2 tablets by mouth daily.   TURMERIC CURCUMIN PO Take 1,000 mg by mouth daily.   venlafaxine XR 150 MG 24 hr capsule Commonly known as: Effexor XR Take 1 capsule (150 mg total) by mouth daily with breakfast. .   venlafaxine XR 75 MG 24 hr capsule Commonly known as: Effexor XR Take 1 capsule (75 mg total) by mouth daily with breakfast.   vitamin B-12 1000 MCG tablet Commonly known as: CYANOCOBALAMIN Take 1,000 mcg by mouth daily.   vitamin C 1000 MG tablet Take 1,000 mg by mouth daily. Two tabs daily   Vitamin D3 25 MCG (1000 UT) Caps Take 2 capsules by mouth daily.       All past medical history, surgical history, allergies, family history, immunizations andmedications were updated in the EMR today and reviewed under the history and medication portions of their EMR.     No results found for this or any previous visit (from the past 2160 hour(s)).   ROS: 14 pt review of systems performed and negative (unless mentioned in an HPI)  Objective: BP 94/62 (BP Location: Left Arm, Patient Position: Sitting, Cuff Size: Normal)   Pulse 72   Temp 98.1 F (36.7 C) (Temporal)    Resp 16   Ht 5\' 4"  (1.626 m)   Wt 134  lb 8 oz (61 kg)   LMP  (LMP Unknown)   SpO2 98%   BMI 23.09 kg/m  Gen: Afebrile. No acute distress. Nontoxic. Pleasant female.  HENT: AT. [place]. Bilateral TM visualized and normal in appearance. MMM. Bilateral nares no erthema, mild drainage present.. Throat without erythema or exudates. No cough or shortness of breath Eyes:Pupils Equal Round Reactive to light, Extraocular movements intact,  Conjunctiva without redness, discharge or icterus. Neck/lymp/endocrine: Supple,no lymphadenopathy, no thyromegaly CV: RRR no murmur, no edema Chest: CTAB, no wheeze or crackles Neuro:  Normal gait. PERLA. EOMi. Alert. Oriented x3  Psych: Normal affect, dress and demeanor. Normal speech. Normal thought content and judgment.   No exam data present  Assessment/plan: DOVIE KAPUSTA is a 54 y.o. female present for [facility] hyperlipidemia /smoker - Continue mediterranean  diet. Her diet seems to be healthy options by her description  - intolerant to statin.  - lipid panel collected today for recheck.  - continue exercise.  - start fiber/metamucil supplement or increase fiber naturally in the diet- educational information provided.  - if elevated again, will start zeita. Pt is in agreement.   Depression with anxiety - stable.  -continue exercise.  - continue effexor 225 mg.  - tried: effexor, zoloft, wellbutrin, remeron, seroquel - pt declined referral to psych -Follow-up 5.5 months   Allergy: She does not appear infectious today by exam.  Restart zyrtec. Continue flonase.  If not improving or worsening, follow up 2 weeks.  covid vaccine counseled today. Questions answered and pt provided with info for vaccine at [facility] website or local pharmacy.    Return in about 6 months (around 01/31/2020).   Orders Placed This Encounter  Procedures  . Lipid panel   Meds ordered this encounter  Medications  . cetirizine (ZYRTEC) 10 MG tablet    Sig: Take 1 tablet (10 mg  total) by mouth daily.    Dispense:  90 tablet    Refill:  4  . venlafaxine XR (EFFEXOR XR) 150 MG 24 hr capsule    Sig: Take 1 capsule (150 mg total) by mouth daily with breakfast. .    Dispense:  90 capsule    Refill:  1    Total dose 225 (150+75)  . venlafaxine XR (EFFEXOR XR) 75 MG 24 hr capsule    Sig: Take 1 capsule (75 mg total) by mouth daily with breakfast.    Dispense:  90 capsule    Refill:  1    Total dose 225 (with the 150 mg tab also already prescribed)   Referral Orders  No referral(s) requested today       Electronically signed by: Michelle Pacini, DO [facility] [place]

## 2019-08-07 ENCOUNTER — Telehealth: Payer: Self-pay | Admitting: Family Medicine

## 2019-08-07 MED ORDER — EZETIMIBE 10 MG PO TABS: 10.0000 mg | ORAL_TABLET | Freq: Every day | ORAL | 3 refills | Status: DC

## 2019-08-07 NOTE — Telephone Encounter (Signed)
Please inform patient: Her cholesterol panel is even higher than prior.  This is more than likely genetic for her. The total cholesterol 1 up from 276-283.  The bad/LDL cholesterol went up from 169-187.  I have called in the medication that we discussed called Zetia, that is not a statin.  She is to start this daily in addition to the increased fiber diet we discussed. Follow-up in 5 and half months for chronic conditions and we will repeat her cholesterol panel at that time to see how it is responding to the new medication and fiber.

## 2019-08-07 NOTE — Telephone Encounter (Signed)
Patient advised of results and provider recommendations, voiced understanding.  Appt scheduled 01/22/20 for [facility] and cholesterol check.

## 2019-08-21 ENCOUNTER — Ambulatory Visit
Admission: RE | Admit: 2019-08-21 | Discharge: 2019-08-21 | Disposition: A | Discharge status: Home / Self Care | Payer: PRIVATE HEALTH INSURANCE | Source: Ambulatory Visit | Attending: Family Medicine | Admitting: Family Medicine

## 2019-08-21 ENCOUNTER — Other Ambulatory Visit: Payer: Self-pay

## 2019-08-21 DIAGNOSIS — M8580 Other specified disorders of bone density and structure, unspecified site: Secondary | ICD-10-CM

## 2019-08-21 DIAGNOSIS — Z1239 Encounter for other screening for malignant neoplasm of breast: Secondary | ICD-10-CM

## 2019-08-22 ENCOUNTER — Telehealth: Payer: Self-pay | Admitting: Family Medicine

## 2019-08-22 ENCOUNTER — Encounter: Payer: Self-pay | Admitting: Family Medicine

## 2019-08-22 MED ORDER — ALENDRONATE SODIUM 70 MG PO TABS
70.0000 mg | ORAL_TABLET | ORAL | 11 refills | Status: DC
Start: 2019-08-22 — End: 2020-06-02

## 2019-08-22 NOTE — Telephone Encounter (Signed)
Pt was called and VM was left to return call  °

## 2019-08-22 NOTE — Telephone Encounter (Signed)
Please inform patient Her mammogram is normal. Her bone density resulted with decreased density from last test in 2018.  At that time she was -2.4 which was considered osteopenia, she is now -2.5 which is considered osteoporosis.  This does put her at greater risk for fracture with even minimal/mild injury. Continue her calcium and vitamin D at current doses.  Routine exercise is helpful for bone mass.  A medication called Fosamax can help decrease advancement of osteoporosis.  If she is agreeable to starting Fosamax I will call this in for her today.  Fosamax is a once weekly medication taken with a full glass of water on an empty stomach.

## 2019-08-22 NOTE — Addendum Note (Signed)
Addended by: Felix Pacini A on: 08/22/2019 04:28 PM   Modules accepted: Orders

## 2019-08-22 NOTE — Telephone Encounter (Signed)
Pt was called and given information/instructions. She would like to start Fosamax. Send to [facility].

## 2019-10-02 ENCOUNTER — Ambulatory Visit: Payer: PRIVATE HEALTH INSURANCE | Admitting: Family Medicine

## 2019-10-30 ENCOUNTER — Encounter: Payer: Self-pay | Admitting: Family Medicine

## 2019-10-30 ENCOUNTER — Other Ambulatory Visit: Payer: Self-pay

## 2019-10-30 ENCOUNTER — Ambulatory Visit (INDEPENDENT_AMBULATORY_CARE_PROVIDER_SITE_OTHER): Payer: PRIVATE HEALTH INSURANCE | Admitting: Family Medicine

## 2019-10-30 VITALS — BP 118/70 | HR 86 | Temp 98.3°F | Resp 17 | Ht 64.0 in | Wt 117.2 lb

## 2019-10-30 DIAGNOSIS — R413 Other amnesia: Secondary | ICD-10-CM

## 2019-10-30 DIAGNOSIS — Z720 Tobacco use: Secondary | ICD-10-CM

## 2019-10-30 DIAGNOSIS — M818 Other osteoporosis without current pathological fracture: Secondary | ICD-10-CM | POA: Diagnosis not present

## 2019-10-30 DIAGNOSIS — F418 Other specified anxiety disorders: Secondary | ICD-10-CM

## 2019-10-30 LAB — COMPREHENSIVE METABOLIC PANEL
ALT: 11 U/L (ref 0–35)
AST: 16 U/L (ref 0–37)
Albumin: 4.6 g/dL (ref 3.5–5.2)
Alkaline Phosphatase: 69 U/L (ref 39–117)
BUN: 5 mg/dL — ABNORMAL LOW (ref 6–23)
CO2: 26 mEq/L (ref 19–32)
Calcium: 10.1 mg/dL (ref 8.4–10.5)
Chloride: 94 mEq/L — ABNORMAL LOW (ref 96–112)
Creatinine, Ser: 0.6 mg/dL (ref 0.40–1.20)
GFR: 103.38 mL/min (ref >60.00)
Glucose, Bld: 87 mg/dL (ref 70–99)
Potassium: 5.1 mEq/L (ref 3.5–5.1)
Sodium: 128 mEq/L — ABNORMAL LOW (ref 135–145)
Total Bilirubin: 0.5 mg/dL (ref 0.2–1.2)
Total Protein: 7.1 g/dL (ref 6.0–8.3)

## 2019-10-30 LAB — VITAMIN B12: Vitamin B-12: 804 pg/mL (ref 211–911)

## 2019-10-30 LAB — VITAMIN D 25 HYDROXY (VIT D DEFICIENCY, FRACTURES): VITD: 35.91 ng/mL (ref 30.00–100.00)

## 2019-10-30 LAB — TSH: TSH: 0.86 u[IU]/mL (ref 0.35–4.50)

## 2019-10-30 MED ORDER — LORAZEPAM 0.5 MG PO TABS
0.5000 mg | ORAL_TABLET | Freq: Two times a day (BID) | ORAL | 1 refills | Status: DC | PRN
Start: 2019-10-30 — End: 2020-04-11

## 2019-10-30 NOTE — Patient Instructions (Signed)
Follow up in 2-3 weeks.  Continue effexor.  Start ativan in the morning and at dinner.   We will call you with lab results.

## 2019-10-30 NOTE — Addendum Note (Signed)
Addended by: Felix Pacini A on: 10/30/2019 04:05 PM   Modules accepted: Orders

## 2019-10-30 NOTE — Progress Notes (Signed)
This visit occurred during the SARS-CoV-2 public health emergency.  Safety protocols were in place, including screening questions prior to the visit, additional usage of staff PPE, and extensive cleaning of exam room while observing appropriate contact time as indicated for disinfecting solutions.    Michelle Arnold , 1966-02-25, 54 y.o., female MRN: [id number] Patient Care Team    Relationship Specialty Notifications Start End  Ma Hillock, DO PCP - General Family Medicine  11/03/15     Chief Complaint  Patient presents with  . Depression    Pt is having shakiness, stuck on thoughts or what to do, pt feels like nothing taste good.   . Anxiety     Subjective: Pt presents for an OV with complaints of worsening anxiety.  She presents today with her husband who also provides some history. Depression/anxiety: Patient reports she is "stuck on her thoughts."She feels she gets locked up or freezes and cannot think well.  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  She reports she just froze and cannot think of what to do next.  An example her husband gives is when they were driving home from the beach in separate cars, she was so fearful she was going to get lost and he was going to "lose her. "He reports he was right in front of her, and she did not see him and missed a turn when he turned.  She recalls a situation of feeling panicked and not knowing what to do.  He has had stressors surrounding their business, financial, she is caring for 2 of her grandchildren and her mother lives in their home.  Her son almost passed away a few months ago, and she found him in time-which saved his life. Has been reports overall he thinks she sleeps well.  She does talk in her sleep on occasions and she does snore.  He reports that her entire motivation and daily routine has changed over the last few months.  She used to get up have a couple  coffee, smoke a cigarette and do her devotions and now she will get up and frequently go back to bed for a nap.  She reports she feels overwhelmed and does not want to deal with the day.  She reports food no longer tastes good.  She is an everyday smoker.  She has lost weight.  Her husband states she does not eat much.   Depression screen [facility] [place] 2/9 10/30/2019 08/06/2019 02/01/2019 10/30/2018 10/30/2018  Decreased Interest 3 1 0 1 1  Down, Depressed, Hopeless 3 1 0 1 1  PHQ - 2 Score 6 2 0 2 2  Altered sleeping 3 0 _0 Tired, decreased energy 0 _1 Change in appetite _2 Feeling bad or failure about yourself  _3 Trouble concentrating 3 3 0 3 3  Moving slowly or fidgety/restless 0 0 _4 Suicidal thoughts 0 0 0 0 0  PHQ-9 Score _5 Difficult doing work/chores Extremely dIfficult Somewhat difficult Somewhat difficult Very difficult Very difficult  Some recent data might be hidden    Allergies  Allergen Reactions  . Atorvastatin     Memory loss  . Statins    Social History   Social History Narrative   Married. Has children.  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    Everyday smoker. Occasional alcohol. No drugs.    Past Medical History:  Diagnosis Date  . Allergy   . Chicken pox as a child  . CTS (carpal tunnel syndrome)    right  . Depression with anxiety   . Dermatitis 11/09/2011   Right arm  . Fatigue 11/09/2011  . Hidradenitis suppurativa 12/16/2011  . Hyperlipidemia   . Measles as a child  . Migraine 11/09/2011  . Mumps as a child  . 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 Perimenopausal   . Polydipsia 01/11/2018  . Tobacco abuse 11/09/2011  . Vitamin D deficiency    Past Surgical History:  Procedure Laterality Date  . LEEP  2004   Family History  Problem Relation Age of Onset  . Diabetes Mother        ?  Marland Kitchen Osteoporosis Mother   . Hyperlipidemia Father   .  Heart disease Father        MI in 51  . Hypertension Father   . Heart attack Father 14  . Cancer Maternal Grandmother        lung- smoker  . Osteoporosis Maternal Grandmother   . Cancer Maternal Grandfather        prostate  . Cataracts Paternal Grandmother   . Other Paternal Grandfather        PAD with gangrene  . Breast cancer Maternal Aunt    Allergies as of 10/30/2019      Reactions   Atorvastatin    Memory loss   Statins       Medication List       Accurate as of October 30, 2019 12:58 PM. If you have any questions, ask your nurse or doctor.        alendronate 70 MG tablet Commonly known as: FOSAMAX Take 1 tablet (70 mg total) by mouth every 7 (seven) days. Take with a full glass of water on an empty stomach.   calcium gluconate 500 MG tablet Take 2 tablets by mouth daily.   cetirizine 10 MG tablet Commonly known as: ZYRTEC Take 1 tablet (10 mg total) by mouth daily.   CoQ10 100 MG Caps Take 1 tablet by mouth daily.   ezetimibe 10 MG tablet Commonly known as: Zetia Take 1 tablet (10 mg total) by mouth daily.   fluticasone 50 MCG/ACT nasal spray Commonly known as: FLONASE Place 1 spray into both nostrils daily.   LORazepam 0.5 MG tablet Commonly known as: ATIVAN Take 1 tablet (0.5 mg total) by mouth 2 (two) times daily as needed for anxiety. Started by: Howard Pouch, DO   multivitamin tablet Take 1 tablet by mouth daily.   SUPER B COMPLEX/C PO Take 2 tablets by mouth daily.   TURMERIC CURCUMIN PO Take 1,000 mg by mouth daily.   venlafaxine XR 150 MG 24 hr capsule Commonly known as: Effexor XR Take 1 capsule (150 mg total) by mouth daily with breakfast. .   venlafaxine XR 75 MG 24 hr capsule Commonly known as: Effexor XR Take 1 capsule (75 mg total) by mouth daily with breakfast.   vitamin B-12 1000 MCG tablet Commonly known as: CYANOCOBALAMIN Take 1,000 mcg by mouth daily.   vitamin C 1000 MG tablet Take 1,000 mg by mouth daily. Two tabs  daily   Vitamin D3 25 MCG (1000 UT) Caps Take 2 capsules by mouth daily.  All past medical history, surgical history, allergies, family history, immunizations andmedications were updated in the EMR today and reviewed under the history and medication portions of their EMR.     ROS: Negative, with the exception of above mentioned in HPI   Objective:  BP 118/70 (BP Location: Right Arm, Patient Position: Sitting, Cuff Size: Normal)   Pulse 86   Temp 98.3 F (36.8 C) (Temporal)   Resp 17   Ht 5' 4" (1.626 m)   Wt 117 lb 4 oz (53.2 kg)   LMP  (LMP Unknown)   SpO2 99%   BMI 20.13 kg/m  Body mass index is 20.13 kg/m. Gen: Afebrile. No acute distress. Nontoxic in appearance, well developed, well nourished.  Very pleasant female. HENT: AT. [place].  Eyes:Pupils Equal Round Reactive to light, Extraocular movements intact,  Conjunctiva without redness, discharge or icterus. Neck/lymp/endocrine: Supple, no lymphadenopathy, no thyromegaly CV: RRR no murmur, no edema Chest: CTAB, no wheeze or crackles. Good air movement, normal resp effort.  Neuro:  Normal gait. PERLA. EOMi. Alert. Oriented x3  Psych: Normal affect, dress and demeanor. Normal speech. Normal thought content and judgment.  No exam data present No results found. No results found for this or any previous visit (from the past 24 hour(s)).  Assessment/Plan: MAKAYLIN CARLO is a 54 y.o. female present for OV for  osteoporosis without current pathological fracture - Vitamin D (25 hydroxy)  Tobacco use Could be possible cause of change in taste perception.  Patient has had a few months history of these changes.  No prior Covid infection known. On follow-up in a few weeks we will discuss CT of the lung CA screening  Depression with anxiety/memory changes The discussion with patient and her husband today rounding her depression and anxiety.  I do believe the majority of her symptoms we discussed today is related to increased  anxiety.  We will attempt to acutely address her anxiety with close follow-up.  Hopefully, her other symptoms will resolve as her anxiety is better controlled-if not further evaluation will be completed. Rule out vitamin deficiencies, thyroid as potential causes. Added Ativan 0.5 mg twice daily.  [employer] reviewed today.  Discussed addictive potential with patient today. Continue Effexor to 25 mg daily Referral to psychology placed today. Past medications: Wellbutrin, Lexapro, Seroquel, Remeron - Comp Met (CMET) - Vitamin D (25 hydroxy) - TSH - B12 Follow-up in 2-3 weeks    Reviewed expectations re: course of current medical issues.  Discussed self-management of symptoms.  Outlined signs and symptoms indicating need for more acute intervention.  Patient verbalized understanding and all questions were answered.  Patient received an After-Visit Summary.    Orders Placed This Encounter  Procedures  . Comp Met (CMET)  . Vitamin D (25 hydroxy)  . TSH  . B12   Meds ordered this encounter  Medications  . LORazepam (ATIVAN) 0.5 MG tablet    Sig: Take 1 tablet (0.5 mg total) by mouth 2 (two) times daily as needed for anxiety.    Dispense:  30 tablet    Refill:  1   Referral Orders  No referral(s) requested today    > 45 Minutes was dedicated to this patient's encounter to include pre-visit review of chart, face-to-face time with patient and post-visit work- which include documentation and prescribing medications and/or ordering test when necessary.    Note is dictated utilizing voice recognition software. Although note has been proof read prior to signing, occasional typographical errors still can  be missed. If any questions arise, please do not hesitate to call for verification.   electronically signed by:  Howard Pouch, DO  [place]

## 2019-10-31 ENCOUNTER — Telehealth: Payer: Self-pay | Admitting: Family Medicine

## 2019-10-31 NOTE — Telephone Encounter (Signed)
Pt was called and VM was left to return call  °

## 2019-10-31 NOTE — Telephone Encounter (Signed)
Tried to contact pt, no answer

## 2019-10-31 NOTE — Telephone Encounter (Signed)
Pt was called and given information, she verbalized understanding. Appt was changed.

## 2019-10-31 NOTE — Telephone Encounter (Signed)
Please inform patient the following information: Her vitamin D and B12 are normal Her thyroid levels are normal Her sodium levels are significantly low at 128.   -Low sodium can cause nausea, vomiting, decreased appetite, headache, confusion loss of energy, drowsiness fatigue muscle spasms, muscle weakness.  In very extreme cases it can even cause seizures or coma.  There are multiple reasons why people develop low sodium.  The most likely reason in her case, is a side effect of her higher dose Effexor.  It is extremely important to get her levels back into normal range.  It is important to replace the sodium slowly or there could be  side effects as well.   -Therefore we need to taper down on the Effexor.  Starting immediately only by taking the 150 mg of Effexor daily for 7 days.  Then decreasing to 75 mg daily. -In addition, she is to drink Gatorade or Powerade etc. for the electrolyte replacement daily and try to drink one V8 juice a day (has a good amount of sodium).   -I would like to follow her up closer than prior discussion, therefore please cancel her August 10 appointment and move it up to either Wednesday the fourth or Thursday the fifth of August.  We will need to retest her sodium and discuss condition in more detail at that time.  Thanks!

## 2019-11-08 ENCOUNTER — Telehealth: Payer: Self-pay | Admitting: Family Medicine

## 2019-11-08 ENCOUNTER — Other Ambulatory Visit: Payer: Self-pay

## 2019-11-08 ENCOUNTER — Encounter: Payer: Self-pay | Admitting: Family Medicine

## 2019-11-08 ENCOUNTER — Ambulatory Visit (INDEPENDENT_AMBULATORY_CARE_PROVIDER_SITE_OTHER): Payer: PRIVATE HEALTH INSURANCE | Admitting: Family Medicine

## 2019-11-08 VITALS — BP 106/72 | HR 77 | Temp 98.2°F | Ht 64.0 in | Wt 117.4 lb

## 2019-11-08 DIAGNOSIS — F418 Other specified anxiety disorders: Secondary | ICD-10-CM

## 2019-11-08 DIAGNOSIS — Z122 Encounter for screening for malignant neoplasm of respiratory organs: Secondary | ICD-10-CM

## 2019-11-08 DIAGNOSIS — E871 Hypo-osmolality and hyponatremia: Secondary | ICD-10-CM

## 2019-11-08 DIAGNOSIS — Z720 Tobacco use: Secondary | ICD-10-CM | POA: Diagnosis not present

## 2019-11-08 DIAGNOSIS — F17210 Nicotine dependence, cigarettes, uncomplicated: Secondary | ICD-10-CM | POA: Diagnosis not present

## 2019-11-08 LAB — COMPREHENSIVE METABOLIC PANEL
ALT: 15 U/L (ref 0–35)
AST: 19 U/L (ref 0–37)
Albumin: 4.4 g/dL (ref 3.5–5.2)
Alkaline Phosphatase: 63 U/L (ref 39–117)
BUN: 7 mg/dL (ref 6–23)
CO2: 28 mEq/L (ref 19–32)
Calcium: 9.9 mg/dL (ref 8.4–10.5)
Chloride: 98 mEq/L (ref 96–112)
Creatinine, Ser: 0.55 mg/dL (ref 0.40–1.20)
GFR: 114.29 mL/min (ref >60.00)
Glucose, Bld: 92 mg/dL (ref 70–99)
Potassium: 5.5 mEq/L — ABNORMAL HIGH (ref 3.5–5.1)
Sodium: 131 mEq/L — ABNORMAL LOW (ref 135–145)
Total Bilirubin: 0.3 mg/dL (ref 0.2–1.2)
Total Protein: 6.9 g/dL (ref 6.0–8.3)

## 2019-11-08 MED ORDER — SODIUM CHLORIDE 1 G PO TABS
1.0000 g | ORAL_TABLET | Freq: Every day | ORAL | 1 refills | Status: DC
Start: 2019-11-08 — End: 2019-12-21

## 2019-11-08 NOTE — Patient Instructions (Addendum)
Continue effexor 150 mg QD for now. Once we get your sodium levels back we will decide if needing further change in dose and need for electrolyte replacement (gatorade/V8) I will order the CT chest screen for you . They will call to schedule and you can check to see if it is covered or out of pocket cost to you.     Hyponatremia Hyponatremia is when the amount of salt (sodium) in your blood is too low. When salt levels are low, your body may take in extra water. This can cause swelling throughout the body. The swelling often affects the brain. What are the causes? This condition may be caused by:  Certain medical problems or conditions.  Vomiting a lot.  Having watery poop (diarrhea) often.  Certain medicines or illegal drugs.  Not having enough water in the body (dehydration).  Drinking too much water.  Eating a diet that is low in salt.  Large burns on your body.  Too much sweating. What increases the risk? You are more likely to get this condition if you:  Have long-term (chronic) kidney disease.  Have heart failure.  Have a medical condition that causes you to have watery poop often.  Do very hard exercises.  Take medicines that affect the amount of salt is in your blood. What are the signs or symptoms? Symptoms of this condition include:  Headache.  Feeling like you may vomit (nausea).  Vomiting.  Being very tired (lethargic).  Muscle weakness and cramps.  Not wanting to eat as much as normal (loss of appetite).  Feeling weak or light-headed. Severe symptoms of this condition include:  Confusion.  Feeling restless (agitation).  Having a fast heart rate.  Passing out (fainting).  Seizures.  Coma. How is this treated? Treatment for this condition depends on the cause. Treatment may include:  Getting fluids through an IV tube that is put into one of your veins.  Taking medicines to fix the salt levels in your blood. If medicines are causing  the problem, your medicines will need to be changed.  Limiting how much water or fluid you take in.  Monitoring in the hospital to watch your symptoms. Follow these instructions at home:   Take over-the-counter and prescription medicines only as told by your doctor. Many medicines can make this condition worse. Talk with your doctor about any medicines that you are taking.  Eat and drink exactly as you are told by your doctor. ? Eat only the foods you are told to eat. ? Limit how much fluid you take.  Do not drink alcohol.  Keep all follow-up visits as told by your doctor. This is important. Contact a doctor if:  You feel more like you may vomit.  You feel more tired.  Your headache gets worse.  You feel more confused.  You feel weaker.  Your symptoms go away and then they come back.  You have trouble following the diet instructions. Get help right away if:  You have a seizure.  You pass out.  You keep having watery poop.  You keep vomiting. Summary  Hyponatremia is when the amount of salt in your blood is too low.  When salt levels are low, you can have swelling throughout the body. The swelling mostly affects the brain.  Treatment depends on the cause. Treatment may include getting IV fluids, medicines, or not drinking as much fluid. This information is not intended to replace advice given to you by your health care provider. Make  sure you discuss any questions you have with your health care provider. Document Revised: 06/08/2018 Document Reviewed: 02/23/2018 Elsevier Patient Education  2020 [employer]   Syndrome of Inappropriate Antidiuretic Hormone Syndrome of inappropriate antidiuretic hormone (SIADH) is a condition in which extra antidiuretic hormone (ADH) is produced in the body. ADH controls water levels in the body. When too much of the hormone is produced, it can cause problems such as low salt (sodium) levels in your blood and reduced urine  production. When this happens, the body retains too much water, which can cause various symptoms. Symptoms can range from mild to severe. SIADH is most common in people who are hospitalized for another reason. What are the causes? This condition may be caused by:  Tumors.  Injury to the brain.  Infection or inflammation in the brain, such as meningitis or encephalitis.  Lung infections or diseases.  Cancer.  Major surgery.  Extreme exercise.  Stroke.  Guillain-Barr syndrome (GBS).  Medicines, including chemotherapy drugs, NSAIDs, antidepressants and pain medicines.  Problems with breathing (respiratory failure).  AIDS. What increases the risk? The following factors may make you more likely to develop this condition:  Increasing age.  Recent major surgery.  Hospitalization. What are the signs or symptoms? Symptoms of this condition include:  Irritability.  Muscle cramps.  Nausea or vomiting.  Mental changes, such as memory problems, aggressiveness, confusion, or hallucinations.  Seizure.  Coma. How is this diagnosed? This condition may be diagnosed based on:  Your medical history.  A physical exam.  Blood tests.  Urine tests.  Chest X-ray or brain scan. How is this treated? Treatment for this condition focuses on relieving symptoms. Treatment may include:  Restricting fluid intake.  Receiving sterile salt water (saline) slowly through an IV.  Medicines to help remove water from the body (diuretics).  Medicines to help control sodium levels. Initial treatment for SIADH is done in a hospital, so your health care team can monitor your condition closely. Other treatments may focus on treating the underlying cause of the condition. Follow these instructions at home:  Follow your health care provider's instructions on restricting your intake of fluids.  Take over-the-counter and prescription medicines only as told by your health care  provider.  Keep all follow-up visits as told by your health care provider. This is important. Contact a health care provider if you:  Have muscle cramps.  Are nauseous or you vomit. Get help right away if you have:  A seizure.  Loss of consciousness.  Mental changes, such as memory problems, aggressiveness, confusion, or hallucinations. Summary  Syndrome of inappropriate antidiuretic hormone (SIADH) is a condition in which extra antidiuretic hormone (ADH) is produced in the body.  SIADH is most common in people who are hospitalized for another reason.  Initial treatment for SIADH is done in the hospital setting in order to monitor your condition closely.  Follow your health care provider's instructions on restricting your intake of fluids. This information is not intended to replace advice given to you by your health care provider. Make sure you discuss any questions you have with your health care provider. Document Revised: 04/14/2017 Document Reviewed: 04/14/2017 Elsevier Patient Education  2020 [employer].

## 2019-11-08 NOTE — Telephone Encounter (Signed)
Please call patient Her sodium levels are improving from 128 now is 131. Normal is above 135. Her potassium levels unfortunately are also increasing therefore we have to change the way we are supplementing her sodium so she is not increasing her potassium as well.  Recommendations are: Stop drinking be V8/Gatorade. Instead we are going to supplement her sodium with salt tabs. She is to take 1 salt tab a day with a meal. I have called this in for her. She can continue with the Effexor 150 mg daily.  Follow-up in 4 weeks.

## 2019-11-08 NOTE — Progress Notes (Signed)
This visit occurred during the SARS-CoV-2 public health emergency.  Safety protocols were in place, including screening questions prior to the visit, additional usage of staff PPE, and extensive cleaning of exam room while observing appropriate contact time as indicated for disinfecting solutions.    Michelle Arnold , 10-May-1965, 54 y.o., female MRN: [id number] Patient Care Team    Relationship Specialty Notifications Start End  Ma Hillock, DO PCP - General Family Medicine  11/03/15     Chief Complaint  Patient presents with   low sodium recheck   depression with anxiety     Subjective: Pt presents for an OV to follow-up on her anxiety, medication changes and hyponatremia. Her husband accompanies her today. Depression/anxiety: After last appointment patient was found to have low sodium. Her Effexor dose was decreased to 150 mg daily and patient was encouraged to start supplementing with V8 or Gatorade servings daily to help increase sodium levels. Ativan 0.5 mg twice daily was added to start to help with anxiety. Patient reports she is only used this once since prescribed. Patient reports she feels mildly improved since her last visit. Her husband feels she is still fatigued and unmotivated. He still sees a change from her baseline. Prior note: Patient reports she is "stuck on her thoughts."She feels she gets locked up or freezes and cannot think well.  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  She reports she just froze and cannot think of what to do next.  An example her husband gives is when they were driving home from the beach in separate cars, she was so fearful she was going to get lost and he was going to "lose her. "He reports he was right in front of her, and she did not see him and missed a turn when he turned.  She recalls a situation of feeling panicked and not knowing what to do.  He has had stressors  surrounding their business, financial, she is caring for 2 of her grandchildren and her mother lives in their home.  Her son almost passed away a few months ago, and she found him in time-which saved his life. Has been reports overall he thinks she sleeps well.  She does talk in her sleep on occasions and she does snore.  He reports that her entire motivation and daily routine has changed over the last few months.  She used to get up have a couple coffee, smoke a cigarette and do her devotions and now she will get up and frequently go back to bed for a nap.  She reports she feels overwhelmed and does not want to deal with the day.  She reports food no longer tastes good.  She is an everyday smoker.  She has lost weight.  Her husband states she does not eat much.   Depression screen [facility] 2/9 10/30/2019 08/06/2019 02/01/2019 10/30/2018 10/30/2018  Decreased Interest 3 1 0 1 1  Down, Depressed, Hopeless 3 1 0 1 1  PHQ - 2 Score 6 2 0 2 2  Altered sleeping 3 0 1 1 1   Tired, decreased energy 0 3 1 1 1   Change in appetite 3 1 1 2 2   Feeling bad or failure about yourself  3 1 1 2 2   Trouble concentrating 3 3 0 3 3  Moving slowly or fidgety/restless 0 0 2 2 2   Suicidal thoughts 0  0 0 0 0  PHQ-9 Score 18 10 6 13 13   Difficult doing work/chores Extremely dIfficult Somewhat difficult Somewhat difficult Very difficult Very difficult  Some recent data might be hidden    Allergies  Allergen Reactions   Atorvastatin     Memory loss   Statins    Social History   Social History Narrative   Married. Has children.    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    Everyday smoker. Occasional alcohol. No drugs.    Past Medical History:  Diagnosis Date   Allergy    Chicken pox as a child   CTS (carpal tunnel syndrome)    right   Depression with anxiety    Dermatitis 11/09/2011   Right arm   Fatigue 11/09/2011   Hidradenitis suppurativa 12/16/2011   Hyperlipidemia    Measles as a child   Migraine 11/09/2011    Mumps as a child   Other abnormal Papanicolaou smear of cervix and cervical HPV(795.09) 2004   required LEEP procedure in 04 but no recurrence   Perimenopausal    Polydipsia 01/11/2018   Tobacco abuse 11/09/2011   Vitamin D deficiency    Past Surgical History:  Procedure Laterality Date   LEEP  2004   Family History  Problem Relation Age of Onset   Diabetes Mother        ?   Osteoporosis Mother    Hyperlipidemia Father    Heart disease Father        MI in 43   Hypertension Father    Heart attack Father 39   Cancer Maternal Grandmother        lung- smoker   Osteoporosis Maternal Grandmother    Cancer Maternal Grandfather        prostate   Cataracts Paternal Grandmother    Other Paternal Grandfather        PAD with gangrene   Breast cancer Maternal Aunt    Allergies as of 11/08/2019      Reactions   Atorvastatin    Memory loss   Statins       Medication List       Accurate as of November 08, 2019 11:59 PM. If you have any questions, ask your nurse or doctor.        alendronate 70 MG tablet Commonly known as: FOSAMAX Take 1 tablet (70 mg total) by mouth every 7 (seven) days. Take with a full glass of water on an empty stomach.   calcium gluconate 500 MG tablet Take 2 tablets by mouth daily.   cetirizine 10 MG tablet Commonly known as: ZYRTEC Take 1 tablet (10 mg total) by mouth daily.   CoQ10 100 MG Caps Take 1 tablet by mouth daily.   ezetimibe 10 MG tablet Commonly known as: Zetia Take 1 tablet (10 mg total) by mouth daily.   fluticasone 50 MCG/ACT nasal spray Commonly known as: FLONASE Place 1 spray into both nostrils daily.   LORazepam 0.5 MG tablet Commonly known as: ATIVAN Take 1 tablet (0.5 mg total) by mouth 2 (two) times daily as needed for anxiety.   multivitamin tablet Take 1 tablet by mouth daily.   sodium chloride 1 g tablet Take 1 tablet (1 g total) by mouth daily. Started by: Howard Pouch, DO   SUPER B COMPLEX/C  PO Take 2 tablets by mouth daily.   TURMERIC CURCUMIN PO Take 1,000 mg by mouth daily.   venlafaxine XR 150 MG 24 hr capsule Commonly known as: Effexor  XR Take 1 capsule (150 mg total) by mouth daily with breakfast. . What changed: Another medication with the same name was removed. Continue taking this medication, and follow the directions you see here. Changed by: Howard Pouch, DO   vitamin B-12 1000 MCG tablet Commonly known as: CYANOCOBALAMIN Take 1,000 mcg by mouth daily.   vitamin C 1000 MG tablet Take 1,000 mg by mouth daily. Two tabs daily   Vitamin D3 25 MCG (1000 UT) Caps Take 2 capsules by mouth daily.       All past medical history, surgical history, allergies, family history, immunizations andmedications were updated in the EMR today and reviewed under the history and medication portions of their EMR.     ROS: Negative, with the exception of above mentioned in HPI  Objective:  BP 106/72 (BP Location: Right Arm, Patient Position: Sitting, Cuff Size: Normal)    Pulse 77    Temp 98.2 F (36.8 C) (Temporal)    Ht 5' 4"  (1.626 m)    Wt 117 lb 6.4 oz (53.3 kg)    LMP  (LMP Unknown)    SpO2 97%    BMI 20.15 kg/m  Body mass index is 20.15 kg/m. Gen: Afebrile. No acute distress. Patient's coloring is improved today and she does appear mildly improved from last appointment. HENT: AT. [place]. Eyes:Pupils Equal Round Reactive to light, Extraocular movements intact,  Conjunctiva without redness, discharge or icterus. Neck/lymp/endocrine: Supple, no lymphadenopathy, no thyromegaly CV: RRR no murmur, no edema, +2/4 P posterior tibialis pulses Chest: CTAB, no wheeze or crackles Neuro:  Normal gait. PERLA. EOMi. Alert. Oriented x3  Psych: Normal affect, dress and demeanor. Normal speech. Normal thought content and judgment.   No exam data present No results found. No results found for this or any previous visit (from the past 24 hour(s)).  Assessment/Plan: Michelle Arnold is a  54 y.o. female present for OV for  Depression with anxiety/memory changes/hyponatremia Continue Effexor 150 mg daily. Her sodium had been stable on this dose in the past hopefully she'll be able to maintain this dosing without needing to decrease further. Only encouraged her to take the Ativan twice daily to address her anxiety. [employer] reviewed.  Discussed addictive potential with patient today. Referral to psychology placed last visit Past medications: Wellbutrin, Lexapro, Seroquel, Remeron CMP with hyponatremia of 128 last visit. Repeat today. If needed will supplement with sodium tabs close follow-up. Vitamin D, B12 and thyroid levels were normal. Hyponatremia likely from the Effexor/SSRI. However patient is a daily smoker, greater than 55 with greater than 30-pack-year history. Discussed CT chest to rule out SIADH cause from potential lung cancers. She is agreeable to this today. Close follow-up dependent upon lab results. Once lab results received patient will be guided on follow-up timing  Shared decision making visit for lung cancer screening: Patient was brought in today for a office visit concerning shared decision making for their lung cancer screening.Michelle Arnold is a 54 y.o. female Patient is between the ages of 75-80: Yes Patient is a current smoker with at least 30 year pack year history or Patient is a former smoker, quit less than 15 years ago and has a 30 pack year history : Yes Patient has current symptoms: No Patient has a  health problem that substantially limits life expectancy or the ability or willingness to have curative lung surgery: No      Reviewed expectations re: course of current medical issues.  Discussed self-management of symptoms.  Outlined signs and symptoms indicating need for more acute intervention.  Patient verbalized understanding and all questions were answered.  Patient received an After-Visit Summary.    Orders Placed  This Encounter  Procedures   CT CHEST LUNG CA SCREEN LOW DOSE W/O CM   Comp Met (CMET)   No orders of the defined types were placed in this encounter.  Referral Orders  No referral(s) requested today     Note is dictated utilizing voice recognition software. Although note has been proof read prior to signing, occasional typographical errors still can be missed. If any questions arise, please do not hesitate to call for verification.   electronically signed by:  Howard Pouch, DO  [place]

## 2019-11-09 ENCOUNTER — Telehealth: Payer: Self-pay

## 2019-11-09 NOTE — Telephone Encounter (Signed)
A user error has taken place: encounter opened in error, closed for administrative reasons.

## 2019-11-09 NOTE — Telephone Encounter (Signed)
LMTCB

## 2019-11-09 NOTE — Telephone Encounter (Signed)
Pt notified and voiced understanding, lab appt made

## 2019-11-13 ENCOUNTER — Ambulatory Visit: Payer: PRIVATE HEALTH INSURANCE | Admitting: Family Medicine

## 2019-11-15 DIAGNOSIS — F17210 Nicotine dependence, cigarettes, uncomplicated: Secondary | ICD-10-CM | POA: Insufficient documentation

## 2019-12-06 ENCOUNTER — Ambulatory Visit: Payer: PRIVATE HEALTH INSURANCE

## 2019-12-11 ENCOUNTER — Other Ambulatory Visit: Payer: Self-pay | Admitting: Family Medicine

## 2019-12-20 ENCOUNTER — Encounter: Payer: Self-pay | Admitting: Family Medicine

## 2019-12-20 ENCOUNTER — Other Ambulatory Visit: Payer: Self-pay

## 2019-12-20 ENCOUNTER — Ambulatory Visit (INDEPENDENT_AMBULATORY_CARE_PROVIDER_SITE_OTHER): Payer: PRIVATE HEALTH INSURANCE | Admitting: Family Medicine

## 2019-12-20 VITALS — BP 106/66 | HR 89 | Temp 98.0°F | Resp 16 | Ht 64.0 in | Wt 115.2 lb

## 2019-12-20 DIAGNOSIS — Z23 Encounter for immunization: Secondary | ICD-10-CM | POA: Diagnosis not present

## 2019-12-20 DIAGNOSIS — F418 Other specified anxiety disorders: Secondary | ICD-10-CM | POA: Diagnosis not present

## 2019-12-20 DIAGNOSIS — E871 Hypo-osmolality and hyponatremia: Secondary | ICD-10-CM | POA: Insufficient documentation

## 2019-12-20 LAB — BASIC METABOLIC PANEL
BUN: 8 mg/dL (ref 6–23)
CO2: 30 mEq/L (ref 19–32)
Calcium: 10 mg/dL (ref 8.4–10.5)
Chloride: 98 mEq/L (ref 96–112)
Creatinine, Ser: 0.58 mg/dL (ref 0.40–1.20)
GFR: 107.45 mL/min (ref >60.00)
Glucose, Bld: 79 mg/dL (ref 70–99)
Potassium: 5.3 mEq/L — ABNORMAL HIGH (ref 3.5–5.1)
Sodium: 133 mEq/L — ABNORMAL LOW (ref 135–145)

## 2019-12-20 NOTE — Progress Notes (Signed)
This visit occurred during the SARS-CoV-2 public health emergency.  Safety protocols were in place, including screening questions prior to the visit, additional usage of staff PPE, and extensive cleaning of exam room while observing appropriate contact time as indicated for disinfecting solutions.    Michelle Arnold , 1966-01-21, 54 y.o., female MRN: [id number] Patient Care Team    Relationship Specialty Notifications Start End  Natalia Leatherwood, DO PCP - General Family Medicine  11/03/15     Chief Complaint  Patient presents with  . Follow-up    CMC, has not taken lorazepam since last visit 4 weeks ago     Subjective: Pt presents for an OV to follow-up on her anxiety, medication changes and hyponatremia. Her husband accompanies her today. 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 She reports she feels she is dealing with it and doing ok on the effexor 150 mg QD. She is taking 1 salt tab a day for her hyponatremia. She has not needed to use the ativan.  Pt reports CT lung screen is not covered by her insurance . Prior note:  After last appointment patient was found to have low sodium. Her Effexor dose was decreased to 150 mg daily and patient was encouraged to start supplementing with V8 or Gatorade servings daily to help increase sodium levels. Ativan 0.5 mg twice daily was added to start to help with anxiety. Patient reports she is only used this once since prescribed. Patient reports she feels mildly improved since her last visit. Her husband feels she is still fatigued and unmotivated. He still sees a change from her baseline. Prior note: Patient reports she is "stuck on her thoughts."She feels she gets locked up or freezes and cannot think well.  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  She reports  she just froze and cannot think of what to do next.  An example her husband gives is when they were driving home from the beach in separate cars, she was so fearful she was going to get lost and he was going to "lose her. "He reports he was right in front of her, and she did not see him and missed a turn when he turned.  She recalls a situation of feeling panicked and not knowing what to do.  He has had stressors surrounding their business, financial, she is caring for 2 of her grandchildren and her mother lives in their home.  Her son almost passed away a few months ago, and she found him in time-which saved his life. Has been reports overall he thinks she sleeps well.  She does talk in her sleep on occasions and she does snore.  He reports that her entire motivation and daily routine has changed over the last few months.  She used to get up have a couple coffee, smoke a cigarette and do her devotions and now she will get up and frequently go back to bed for a nap.  She reports she feels overwhelmed and does not want to deal with the day.  She reports food no longer tastes good.  She is an everyday smoker.  She has lost weight.  Her husband states she does not eat much.   Depression screen [facility] 2/9 10/30/2019 08/06/2019 02/01/2019 10/30/2018 10/30/2018  Decreased Interest 3 1  0 1 1  Down, Depressed, Hopeless 3 1 0 1 1  PHQ - 2 Score 6 2 0 2 2  Altered sleeping 3 0 1 1 1   Tired, decreased energy 0 3 1 1 1   Change in appetite 3 1 1 2 2   Feeling bad or failure about yourself  3 1 1 2 2   Trouble concentrating 3 3 0 3 3  Moving slowly or fidgety/restless 0 0 2 2 2   Suicidal thoughts 0 0 0 0 0  PHQ-9 Score 18 10 6 13 13   Difficult doing work/chores Extremely dIfficult Somewhat difficult Somewhat difficult Very difficult Very difficult  Some recent data might be hidden   GAD 7 : Generalized Anxiety Score 10/30/2019 08/06/2019 02/01/2019 10/30/2018  Nervous, Anxious, on Edge 3 0 2 3  Control/stop worrying 3 0 2  2  Worry too much - different things 3 1 2 3   Trouble relaxing 1 1 2 3   Restless 0 0 2 3  Easily annoyed or irritable 0 0 2 2  Afraid - awful might happen 0 0 2 3  Total GAD 7 Score 10 2 14 19   Anxiety Difficulty Extremely difficult Not difficult at all Somewhat difficult Very difficult    Allergies  Allergen Reactions  . Atorvastatin     Memory loss  . Statins    Social History   Social History Narrative   Married. Has children.    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    Everyday smoker. Occasional alcohol. No drugs.    Past Medical History:  Diagnosis Date  . Allergy   . Chicken pox as a child  . CTS (carpal tunnel syndrome)    right  . Depression with anxiety   . Dermatitis 11/09/2011   Right arm  . Fatigue 11/09/2011  . Hidradenitis suppurativa 12/16/2011  . Hyperlipidemia   . Measles as a child  . Migraine 11/09/2011  . Mumps as a child  . 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 Perimenopausal   . Polydipsia 01/11/2018  . Tobacco abuse 11/09/2011  . Vitamin D deficiency    Past Surgical History:  Procedure Laterality Date  . LEEP  2004   Family History  Problem Relation Age of Onset  . Diabetes Mother        ?  01/09/2012 Osteoporosis Mother   . Hyperlipidemia Father   . Heart disease Father        MI in 22  . Hypertension Father   . Heart attack Father 37  . Cancer Maternal Grandmother        lung- smoker  . Osteoporosis Maternal Grandmother   . Cancer Maternal Grandfather        prostate  . Cataracts Paternal Grandmother   . Other Paternal Grandfather        PAD with gangrene  . Breast cancer Maternal Aunt    Allergies as of 12/20/2019      Reactions   Atorvastatin    Memory loss   Statins       Medication List       Accurate as of December 20, 2019 10:19 AM. If you have any questions, ask your nurse or doctor.        alendronate 70 MG tablet Commonly known as:  FOSAMAX Take 1 tablet (70 mg total) by mouth every 7 (seven) days. Take with a full glass of water on an  empty stomach.   calcium gluconate 500 MG tablet Take 2 tablets by mouth daily.   cetirizine 10 MG tablet Commonly known as: ZYRTEC Take 1 tablet (10 mg total) by mouth daily.   CoQ10 100 MG Caps Take 1 tablet by mouth daily.   ezetimibe 10 MG tablet Commonly known as: Zetia Take 1 tablet (10 mg total) by mouth daily.   fluticasone 50 MCG/ACT nasal spray Commonly known as: FLONASE Place 1 spray into both nostrils daily.   LORazepam 0.5 MG tablet Commonly known as: ATIVAN Take 1 tablet (0.5 mg total) by mouth 2 (two) times daily as needed for anxiety.   multivitamin tablet Take 1 tablet by mouth daily.   sodium chloride 1 g tablet Take 1 tablet (1 g total) by mouth daily.   SUPER B COMPLEX/C PO Take 2 tablets by mouth daily.   TURMERIC CURCUMIN PO Take 1,000 mg by mouth daily.   venlafaxine XR 150 MG 24 hr capsule Commonly known as: Effexor XR Take 1 capsule (150 mg total) by mouth daily with breakfast. .   vitamin B-12 1000 MCG tablet Commonly known as: CYANOCOBALAMIN Take 1,000 mcg by mouth daily.   vitamin C 1000 MG tablet Take 1,000 mg by mouth daily. Two tabs daily   Vitamin D3 25 MCG (1000 UT) Caps Take 2 capsules by mouth daily.       All past medical history, surgical history, allergies, family history, immunizations andmedications were updated in the EMR today and reviewed under the history and medication portions of their EMR.     ROS: Negative, with the exception of above mentioned in HPI  Objective:  BP 106/66 (BP Location: Right Arm, Patient Position: Sitting, Cuff Size: Normal)   Pulse 89   Temp 98 F (36.7 C) (Oral)   Resp 16   Ht 5\' 4"  (1.626 m)   Wt 115 lb 3.2 oz (52.3 kg)   LMP  (LMP Unknown)   SpO2 99%   BMI 19.77 kg/m  Body mass index is 19.77 kg/m. Gen: Afebrile. No acute distress. Pleasant female. Appears relaxed  today HENT: AT. [place]. Eyes:Pupils Equal Round Reactive to light, Extraocular movements intact,  Conjunctiva without redness, discharge or icterus. CV: RRR  Chest: CTAB, no wheeze or crackles.  Neuro:  Normal gait. PERLA. EOMi. Alert. Oriented x3  Psych: Normal affect, dress and demeanor. Normal speech. Normal thought content and judgment.   No exam data present No results found. No results found for this or any previous visit (from the past 24 hour(s)).  Assessment/Plan: Michelle Arnold is a 54 y.o. female present for OV for  Depression with anxiety/memory changes/hyponatremia -She appears improved today. Will recollect BMP to monitor sodium level.  After results received will decide if ok to stay on current dose of effexor. encouraged her to take the Ativan twice daily to address her anxiety, as needed and she reports she has been doing well and has not needed ativan.  Discussed Abilify add on if she felt she needed in the future. She will let 59 know.  [place] reviewed.  Referral to psychology placed last visit Past medications: Wellbutrin, Lexapro, Seroquel, Remeron Vitamin D, B12 and thyroid levels were normal. Hyponatremia likely from the Effexor/SSRI. However patient is a daily smoker, greater than 55 with greater than 30-pack-year history. Last visit Discussed CT chest to rule out SIADH cause from potential lung cancers and insurance will not cover her screen.  - f/u dependent on lab results- if stable  5.5 mos follow up.   Need for influenza vaccination - Flu Vaccine QUAD 36+ mos IM    Reviewed expectations re: course of current medical issues.  Discussed self-management of symptoms.  Outlined signs and symptoms indicating need for more acute intervention.  Patient verbalized understanding and all questions were answered.  Patient received an After-Visit Summary.    Orders Placed This Encounter  Procedures  . Flu Vaccine QUAD 36+ mos IM   No orders of  the defined types were placed in this encounter.  Referral Orders  No referral(s) requested today     Note is dictated utilizing voice recognition software. Although note has been proof read prior to signing, occasional typographical errors still can be missed. If any questions arise, please do not hesitate to call for verification.   electronically signed by:  Felix Pacinienee Joene Gelder, DO  [facility] [place]

## 2019-12-20 NOTE — Patient Instructions (Signed)
We will call you with lab results and guide you from there on salt supplement and effexor dosing etc  Continue ativan as we discussed if you need it.

## 2019-12-21 ENCOUNTER — Other Ambulatory Visit: Payer: Self-pay | Admitting: Family Medicine

## 2019-12-21 MED ORDER — SODIUM CHLORIDE 1 G PO TABS: 1.0000 g | ORAL_TABLET | Freq: Every day | ORAL | 5 refills | Status: DC

## 2019-12-28 ENCOUNTER — Telehealth: Payer: Self-pay

## 2019-12-28 NOTE — Telephone Encounter (Signed)
Husband calling about his wife (DPR) and is very concerned about her. He reports that she cant even function some days. 1 day she is fine and then the next day she is bed sleeping all day. Doesn't eat well. He states that she is "out of it" most of the time.  He needs direction on how to take care of her and what he needs to do to get healthy again.  Please call husband Dorene Sorrow at [phone]

## 2019-12-28 NOTE — Telephone Encounter (Signed)
Spoke w/ husband- sch appt for Wed next week. No need to call back.

## 2020-01-02 ENCOUNTER — Ambulatory Visit: Payer: PRIVATE HEALTH INSURANCE | Admitting: Family Medicine

## 2020-01-02 DIAGNOSIS — Z0289 Encounter for other administrative examinations: Secondary | ICD-10-CM

## 2020-01-17 ENCOUNTER — Ambulatory Visit: Payer: PRIVATE HEALTH INSURANCE | Admitting: Family Medicine

## 2020-01-22 ENCOUNTER — Ambulatory Visit: Payer: No Typology Code available for payment source | Admitting: Family Medicine

## 2020-01-25 ENCOUNTER — Encounter: Payer: Self-pay | Admitting: Family Medicine

## 2020-01-30 ENCOUNTER — Other Ambulatory Visit: Payer: Self-pay

## 2020-01-30 ENCOUNTER — Encounter: Payer: Self-pay | Admitting: Family Medicine

## 2020-01-30 ENCOUNTER — Ambulatory Visit (INDEPENDENT_AMBULATORY_CARE_PROVIDER_SITE_OTHER): Payer: PRIVATE HEALTH INSURANCE | Admitting: Family Medicine

## 2020-01-30 VITALS — BP 107/64 | HR 88 | Temp 98.1°F | Ht 64.0 in | Wt 118.0 lb

## 2020-01-30 DIAGNOSIS — R319 Hematuria, unspecified: Secondary | ICD-10-CM | POA: Diagnosis not present

## 2020-01-30 DIAGNOSIS — Z5320 Procedure and treatment not carried out because of patient's decision for unspecified reasons: Secondary | ICD-10-CM | POA: Insufficient documentation

## 2020-01-30 DIAGNOSIS — R350 Frequency of micturition: Secondary | ICD-10-CM

## 2020-01-30 LAB — POCT URINALYSIS DIPSTICK
Bilirubin, UA: NEGATIVE
Blood, UA: POSITIVE
Glucose, UA: NEGATIVE
Ketones, UA: NEGATIVE
Nitrite, UA: NEGATIVE
Odor: NEGATIVE
Protein, UA: POSITIVE — AB
Spec Grav, UA: 1.01 (ref 1.010–1.025)
Urobilinogen, UA: 0.2 E.U./dL
pH, UA: 6.5 (ref 5.0–8.0)

## 2020-01-30 MED ORDER — CEPHALEXIN 500 MG PO CAPS: 500.0000 mg | ORAL_CAPSULE | Freq: Three times a day (TID) | ORAL | 0 refills | Status: DC

## 2020-01-30 NOTE — Progress Notes (Signed)
This visit occurred during the SARS-CoV-2 public health emergency.  Safety protocols were in place, including screening questions prior to the visit, additional usage of staff PPE, and extensive cleaning of exam room while observing appropriate contact time as indicated for disinfecting solutions.    Michelle Arnold , 1965/06/28, 54 y.o., female MRN: [id number] Patient Care Team    Relationship Specialty Notifications Start End  Michelle Leatherwood, DO PCP - General Family Medicine  11/03/15     Chief Complaint  Patient presents with  . Polyuria    pt c/o urine freq, urine odor x 1 day     Subjective: Pt presents for an OV with complaints of increased urinary frequency and foul odor of urine 1-2 days in duration.  Patient denies dysuria.  She has mild lower pelvic tenderness intermittently.  Denies lower back pain, fever, chills, nausea or vomit.  H/O hematuria- established w/ Urology in [facility]  2015. Smoker.  Had work-up and CT without identifiable cause for hematuria at that time.    Depression screen [facility] 2/9 01/30/2020 10/30/2019 08/06/2019 02/01/2019 10/30/2018  Decreased Interest 0 3 1 0 1  Down, Depressed, Hopeless 0 3 1 0 1  PHQ - 2 Score 0 6 2 0 2  Altered sleeping - 3 0 1 1  Tired, decreased energy - 0 3 1 1   Change in appetite - 3 1 1 2   Feeling bad or failure about yourself  - 3 1 1 2   Trouble concentrating - 3 3 0 3  Moving slowly or fidgety/restless - 0 0 2 2  Suicidal thoughts - 0 0 0 0  PHQ-9 Score - 18 10 6 13   Difficult doing work/chores - Extremely dIfficult Somewhat difficult Somewhat difficult Very difficult  Some recent data might be hidden    Allergies  Allergen Reactions  . Atorvastatin     Memory loss  . Statins    Social History   Social History Narrative   Married. Has children.    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    Everyday smoker. Occasional alcohol. No drugs.    Past Medical History:  Diagnosis Date  . Allergy   . Chicken pox as a child  . CTS (carpal  tunnel syndrome)    right  . Depression with anxiety   . Dermatitis 11/09/2011   Right arm  . Fatigue 11/09/2011  . Hidradenitis suppurativa 12/16/2011  . Hyperlipidemia   . Measles as a child  . Migraine 11/09/2011  . Mumps as a child  . 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 Perimenopausal   . Polydipsia 01/11/2018  . Tobacco abuse 11/09/2011  . Vitamin D deficiency    Past Surgical History:  Procedure Laterality Date  . LEEP  2004   Family History  Problem Relation Age of Onset  . Diabetes Mother        ?  05 Osteoporosis Mother   . Hyperlipidemia Father   . Heart disease Father        MI in 72  . Hypertension Father   . Heart attack Father 41  . Cancer Maternal Grandmother        lung- smoker  . Osteoporosis Maternal Grandmother   . Cancer Maternal Grandfather        prostate  . Cataracts Paternal Grandmother   . Other Paternal Grandfather        PAD  with gangrene  . Breast cancer Maternal Aunt    Allergies as of 01/30/2020      Reactions   Atorvastatin    Memory loss   Statins       Medication List       Accurate as of January 30, 2020 11:55 AM. If you have any questions, ask your nurse or doctor.        alendronate 70 MG tablet Commonly known as: FOSAMAX Take 1 tablet (70 mg total) by mouth every 7 (seven) days. Take with a full glass of water on an empty stomach.   calcium gluconate 500 MG tablet Take 2 tablets by mouth daily.   cetirizine 10 MG tablet Commonly known as: ZYRTEC Take 1 tablet (10 mg total) by mouth daily.   CoQ10 100 MG Caps Take 1 tablet by mouth daily.   ezetimibe 10 MG tablet Commonly known as: Zetia Take 1 tablet (10 mg total) by mouth daily.   fluticasone 50 MCG/ACT nasal spray Commonly known as: FLONASE Place 1 spray into both nostrils daily.   LORazepam 0.5 MG tablet Commonly known as: ATIVAN Take 1 tablet (0.5 mg total) by mouth  2 (two) times daily as needed for anxiety.   multivitamin tablet Take 1 tablet by mouth daily.   sodium chloride 1 g tablet Take 1 tablet (1 g total) by mouth daily.   SUPER B COMPLEX/C PO Take 2 tablets by mouth daily.   TURMERIC CURCUMIN PO Take 1,000 mg by mouth daily.   venlafaxine XR 150 MG 24 hr capsule Commonly known as: Effexor XR Take 1 capsule (150 mg total) by mouth daily with breakfast. .   vitamin B-12 1000 MCG tablet Commonly known as: CYANOCOBALAMIN Take 1,000 mcg by mouth daily.   vitamin C 1000 MG tablet Take 1,000 mg by mouth daily. Two tabs daily   Vitamin D3 25 MCG (1000 UT) Caps Take 2 capsules by mouth daily.       All past medical history, surgical history, allergies, family history, immunizations andmedications were updated in the EMR today and reviewed under the history and medication portions of their EMR.     ROS: Negative, with the exception of above mentioned in HPI   Objective:  BP 107/64   Pulse 88   Temp 98.1 F (36.7 C) (Oral)   Ht 5\' 4"  (1.626 m)   Wt 118 lb (53.5 kg)   LMP  (LMP Unknown)   SpO2 100%   BMI 20.25 kg/m  Body mass index is 20.25 kg/m. Gen: Afebrile. No acute distress. Nontoxic in appearance, well developed, well nourished.  HENT: AT. [place].  Eyes:Pupils Equal Round Reactive to light, Extraocular movements intact,  Conjunctiva without redness, discharge or icterus. Abd: Soft. NTND. BS present MSK: no CVA tenderness. .  Neuro: Normal gait. PERLA. EOMi. Alert. Oriented x3 Psych: Normal affect, dress and demeanor. Normal speech. Normal thought content and judgment.  No exam data present No results found. Results for orders placed or performed in visit on 01/30/20 (from the past 24 hour(s))  POCT Urinalysis Dipstick     Status: Abnormal   Collection Time: 01/30/20 11:50 AM  Result Value Ref Range   Color, UA yellow    Clarity, UA clear    Glucose, UA Negative Negative   Bilirubin, UA neg    Ketones, UA neg     Spec Grav, UA 1.010 1.010 - 1.025   Blood, UA pos    pH, UA 6.5 5.0 - 8.0  Protein, UA Positive (A) Negative   Urobilinogen, UA 0.2 0.2 or 1.0 E.U./dL   Nitrite, UA neg    Leukocytes, UA Trace (A) Negative   Appearance clear    Odor neg     Assessment/Plan: Michelle Arnold is a 54 y.o. female present for OV for  Hematuria/urinary frequency Elected to start prophylactic treatment with Keflex 3 times daily while awaiting formal microanalysis and urine culture. Patient was encouraged to hydrate. Discussed chronic hematuria with her today, with her being a smoker would want to ensure this has resolved or there has been additional work-up if necessary.  She did not recall the work-up initiated in 2015. - POCT Urinalysis Dipstick - Urinalysis w microscopic + reflex cultur She has an upcoming chronic medical condition appointment-we will be sure to recheck her urine at that time.   Reviewed expectations re: course of current medical issues.  Discussed self-management of symptoms.  Outlined signs and symptoms indicating need for more acute intervention.  Patient verbalized understanding and all questions were answered.  Patient received an After-Visit Summary.    Orders Placed This Encounter  Procedures  . Urinalysis w microscopic + reflex cultur  . POCT Urinalysis Dipstick  . HM COLONOSCOPY   No orders of the defined types were placed in this encounter.  Referral Orders  No referral(s) requested today     Note is dictated utilizing voice recognition software. Although note has been proof read prior to signing, occasional typographical errors still can be missed. If any questions arise, please do not hesitate to call for verification.   electronically signed by:  Felix Pacini, DO  [facility] [place]

## 2020-01-30 NOTE — Patient Instructions (Signed)
Start keflex every 8 hours- for 7 days.  Hydrate- flush and cleanse your kidneys.   I will call you with lab culture once received.   Urinary Tract Infection, Adult A urinary tract infection (UTI) is an infection of any part of the urinary tract. The urinary tract includes:  The kidneys.  The ureters.  The bladder.  The urethra. These organs make, store, and get rid of pee (urine) in the body. What are the causes? This is caused by germs (bacteria) in your genital area. These germs grow and cause swelling (inflammation) of your urinary tract. What increases the risk? You are more likely to develop this condition if:  You have a small, thin tube (catheter) to drain pee.  You cannot control when you pee or poop (incontinence).  You are female, and: ? You use these methods to prevent pregnancy:  A medicine that kills sperm (spermicide).  A device that blocks sperm (diaphragm). ? You have low levels of a female hormone (estrogen). ? You are pregnant.  You have genes that add to your risk.  You are sexually active.  You take antibiotic medicines.  You have trouble peeing because of: ? A prostate that is bigger than normal, if you are female. ? A blockage in the part of your body that drains pee from the bladder (urethra). ? A kidney stone. ? A nerve condition that affects your bladder (neurogenic bladder). ? Not getting enough to drink. ? Not peeing often enough.  You have other conditions, such as: ? Diabetes. ? A weak disease-fighting system (immune system). ? Sickle cell disease. ? Gout. ? Injury of the spine. What are the signs or symptoms? Symptoms of this condition include:  Needing to pee right away (urgently).  Peeing often.  Peeing small amounts often.  Pain or burning when peeing.  Blood in the pee.  Pee that smells bad or not like normal.  Trouble peeing.  Pee that is cloudy.  Fluid coming from the vagina, if you are female.  Pain in the  belly or lower back. Other symptoms include:  Throwing up (vomiting).  No urge to eat.  Feeling mixed up (confused).  Being tired and grouchy (irritable).  A fever.  Watery poop (diarrhea). How is this treated? This condition may be treated with:  Antibiotic medicine.  Other medicines.  Drinking enough water. Follow these instructions at home:  Medicines  Take over-the-counter and prescription medicines only as told by your doctor.  If you were prescribed an antibiotic medicine, take it as told by your doctor. Do not stop taking it even if you start to feel better. General instructions  Make sure you: ? Pee until your bladder is empty. ? Do not hold pee for a long time. ? Empty your bladder after sex. ? Wipe from front to back after pooping if you are a female. Use each tissue one time when you wipe.  Drink enough fluid to keep your pee pale yellow.  Keep all follow-up visits as told by your doctor. This is important. Contact a doctor if:  You do not get better after 1-2 days.  Your symptoms go away and then come back. Get help right away if:  You have very bad back pain.  You have very bad pain in your lower belly.  You have a fever.  You are sick to your stomach (nauseous).  You are throwing up. Summary  A urinary tract infection (UTI) is an infection of any part of the  urinary tract.  This condition is caused by germs in your genital area.  There are many risk factors for a UTI. These include having a small, thin tube to drain pee and not being able to control when you pee or poop.  Treatment includes antibiotic medicines for germs.  Drink enough fluid to keep your pee pale yellow. This information is not intended to replace advice given to you by your health care provider. Make sure you discuss any questions you have with your health care provider. Document Revised: 03/09/2018 Document Reviewed: 09/29/2017 Elsevier Patient Education  2020  [employer].

## 2020-02-02 LAB — URINE CULTURE
MICRO NUMBER:: 11127447
SPECIMEN QUALITY:: ADEQUATE

## 2020-02-02 LAB — CULTURE INDICATED

## 2020-02-02 LAB — URINALYSIS W MICROSCOPIC + REFLEX CULTURE
Bilirubin Urine: NEGATIVE
Glucose, UA: NEGATIVE
Hyaline Cast: NONE SEEN /LPF
Ketones, ur: NEGATIVE
Nitrites, Initial: NEGATIVE
Protein, ur: NEGATIVE
Specific Gravity, Urine: 1.003 (ref 1.001–1.03)
Squamous Epithelial / HPF: NONE SEEN /HPF (ref <=5)
pH: 6.5 (ref 5.0–8.0)

## 2020-02-04 ENCOUNTER — Telehealth: Payer: Self-pay | Admitting: Family Medicine

## 2020-02-04 NOTE — Telephone Encounter (Signed)
Patient states she called a couple days ahead to cancel 01/22/20 appt and should not be billed for the no show. Please call patient and advise.

## 2020-02-05 NOTE — Telephone Encounter (Signed)
She was not charged a NS fee for that date. Please call pt and advise.

## 2020-02-07 NOTE — Telephone Encounter (Signed)
Spoke with patient who verbalized understanding.

## 2020-03-05 ENCOUNTER — Other Ambulatory Visit: Payer: Self-pay | Admitting: Family Medicine

## 2020-04-11 ENCOUNTER — Encounter: Payer: Self-pay | Admitting: Internal Medicine

## 2020-04-11 ENCOUNTER — Telehealth (INDEPENDENT_AMBULATORY_CARE_PROVIDER_SITE_OTHER): Payer: PRIVATE HEALTH INSURANCE | Admitting: Internal Medicine

## 2020-04-11 DIAGNOSIS — Z634 Disappearance and death of family member: Secondary | ICD-10-CM

## 2020-04-11 DIAGNOSIS — F4321 Adjustment disorder with depressed mood: Secondary | ICD-10-CM | POA: Diagnosis not present

## 2020-04-11 HISTORY — DX: Adjustment disorder with depressed mood: Z63.4

## 2020-04-11 HISTORY — DX: Adjustment disorder with depressed mood: F43.21

## 2020-04-11 MED ORDER — LORAZEPAM 0.5 MG PO TABS: 0.5000 mg | ORAL_TABLET | Freq: Two times a day (BID) | ORAL | 0 refills | Status: DC | PRN

## 2020-04-11 NOTE — Progress Notes (Signed)
Virtual Visit via telephone Note  I connected with Michelle Arnold on 04/11/20 at  3:30 PM EST by telephone and verified that I am speaking with the correct person using two identifiers.   I discussed the limitations of evaluation and management by telemedicine and the availability of in person appointments. The patient expressed understanding and agreed to proceed.  Present for the visit:  Myself, Dr Cheryll Cockayne, Cheron Schaumann.  The patient is currently at home and I am in the office.    No referring provider.    History of Present Illness: This is an acute visit.  Her son died suddenly and unexpectedly.  She is calling for medication to help her get through this.  Her mother recommended Ativan.  She was not sure what this was and was not aware that was already on her medication list.  She was not sure if she had this at home or not.  When I spoke to her she was at the funeral home, crying and very upset.     Social History   Socioeconomic History  . Marital status: Married    Spouse name: Not on file  . Number of children: Not on file  . Years of education: Not on file  . Highest education level: Not on file  Occupational History  . Occupation: self employed   Tobacco Use  . Smoking status: Current Every Day Smoker    Packs/day: 1.00    Years: 30.00    Pack years: 30.00    Types: Cigarettes  . Smokeless tobacco: Never Used  Vaping Use  . Vaping Use: Never used  Substance and Sexual Activity  . Alcohol use: Yes    Comment: very rare  . Drug use: No  . Sexual activity: Yes    Partners: Male    Comment: Married  Other Topics Concern  . Not on file  Social History Narrative   Married. Has children.    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    Everyday smoker. Occasional alcohol. No drugs.    Social Determinants of Health   Financial Resource Strain: Not on file  Food Insecurity: Not on file  Transportation Needs: Not on file  Physical Activity: Not on file  Stress: Not on file   Social Connections: Not on file     Assessment and Plan:  Sudden loss of her son experiencing extreme grief, which is to be expected  I discussed with her that Ativan would be appropriate to help her get through this time.  I will send a new prescription to her pharmacy in the event that she does not have it at home because she is does not know if she ever took it or not.  Discussed that this potentially could make her drowsy and that she should not drive while taking it.  Will send in a month prescription.     Follow Up Instructions:    I discussed the assessment and treatment plan with the patient. The patient was provided an opportunity to ask questions and all were answered. The patient agreed with the plan and demonstrated an understanding of the instructions.   The patient was advised to call back or seek an in-person evaluation if the symptoms worsen or if the condition fails to improve as anticipated.  Time spent on telephone call - 6 minutes  Pincus Sanes, MD

## 2020-05-01 ENCOUNTER — Other Ambulatory Visit: Payer: Self-pay | Admitting: Family Medicine

## 2020-05-07 ENCOUNTER — Other Ambulatory Visit: Payer: Self-pay

## 2020-05-07 ENCOUNTER — Ambulatory Visit: Payer: 59 | Admitting: Family Medicine

## 2020-05-07 ENCOUNTER — Encounter: Payer: Self-pay | Admitting: Family Medicine

## 2020-05-07 VITALS — BP 118/69 | HR 67 | Temp 97.5°F | Ht 64.0 in | Wt 112.0 lb

## 2020-05-07 DIAGNOSIS — F4321 Adjustment disorder with depressed mood: Secondary | ICD-10-CM | POA: Diagnosis not present

## 2020-05-07 DIAGNOSIS — F418 Other specified anxiety disorders: Secondary | ICD-10-CM

## 2020-05-07 MED ORDER — ARIPIPRAZOLE 5 MG PO TABS: 5.0000 mg | ORAL_TABLET | Freq: Every day | ORAL | 5 refills | Status: DC

## 2020-05-07 MED ORDER — VENLAFAXINE HCL ER 150 MG PO CP24: ORAL_CAPSULE | ORAL | 1 refills | Status: DC

## 2020-05-07 MED ORDER — LORAZEPAM 0.5 MG PO TABS: 0.5000 mg | ORAL_TABLET | Freq: Two times a day (BID) | ORAL | 5 refills | Status: DC | PRN

## 2020-05-07 NOTE — Progress Notes (Signed)
This visit occurred during the SARS-CoV-2 public health emergency.  Safety protocols were in place, including screening questions prior to the visit, additional usage of staff PPE, and extensive cleaning of exam room while observing appropriate contact time as indicated for disinfecting solutions.    Michelle Arnold , 10-09-1965, 55 y.o., female MRN: [id number] Patient Care Team    Relationship Specialty Notifications Start End  Natalia Leatherwood, DO PCP - General Family Medicine  11/03/15     Chief Complaint  Patient presents with  . Anxiety     Subjective: Pt presents for an OV to follow-up on her anxiety, medication changes and hyponatremia. Her husband accompanies her again today.  Depression/anxiety: Since her last visit patient's son unexpectedly passed away.  Her husband is with her today and states she is getting up in the morning having a couple coffee, smoking a cigarette and going back to bed.  She is not interacting with family.  The care of her other children has been shifted to her mother and husband.  She wants to get back to her regular life, but feels sad and anxious.  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 She reports she feels she is dealing with it and doing ok on the effexor 150 mg QD. She is taking 1 salt tab a day for her hyponatremia. She has not needed to use the ativan.  Pt reports CT lung screen is not covered by her insurance. Prior note:  After last appointment patient was found to have low sodium. Her Effexor dose was decreased to 150 mg daily and patient was encouraged to start supplementing with V8 or Gatorade servings daily to help increase sodium levels. Ativan 0.5 mg twice daily was added to start to help with anxiety. Patient reports she is only used this once since prescribed. Patient reports she feels mildly improved since her last visit. Her  husband feels she is still fatigued and unmotivated. He still sees a change from her baseline. Prior note: Patient reports she is "stuck on her thoughts."She feels she gets locked up or freezes and cannot think well.  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  She reports she just froze and cannot think of what to do next.  An example her husband gives is when they were driving home from the beach in separate cars, she was so fearful she was going to get lost and he was going to "lose her. "He reports he was right in front of her, and she did not see him and missed a turn when he turned.  She recalls a situation of feeling panicked and not knowing what to do.  He has had stressors surrounding their business, financial, she is caring for 2 of her grandchildren and her mother lives in their home.  Her son almost passed away a few months ago, and she found him in time-which saved his life. Has been reports overall he thinks she sleeps well.  She does talk in her sleep on occasions and she does snore.  He reports that her entire motivation and daily routine has changed over the last few months.  She used to get up have a couple coffee, smoke a cigarette and do her devotions and now she will get up and frequently go back  to bed for a nap.  She reports she feels overwhelmed and does not want to deal with the day.  She reports food no longer tastes good.  She is an everyday smoker.  She has lost weight.  Her husband states she does not eat much.   Depression screen [facility] 2/9 05/07/2020 01/30/2020 10/30/2019 08/06/2019 02/01/2019  Decreased Interest 3 0 3 1 0  Down, Depressed, Hopeless 3 0 3 1 0  PHQ - 2 Score 6 0 6 2 0  Altered sleeping 3 - 3 0 1  Tired, decreased energy 3 - 0 3 1  Change in appetite 3 - 3 1 1   Feeling bad or failure about yourself  3 - 3 1 1   Trouble concentrating 2 - 3 3 0  Moving slowly or fidgety/restless 2 - 0 0 2   Suicidal thoughts 0 - 0 0 0  PHQ-9 Score 22 - 18 10 6   Difficult doing work/chores - - Extremely dIfficult Somewhat difficult Somewhat difficult  Some recent data might be hidden   GAD 7 : Generalized Anxiety Score 05/07/2020 10/30/2019 08/06/2019 02/01/2019  Nervous, Anxious, on Edge 3 3 0 2  Control/stop worrying 3 3 0 2  Worry too much - different things 3 3 1 2   Trouble relaxing 3 1 1 2   Restless 2 0 0 2  Easily annoyed or irritable 2 0 0 2  Afraid - awful might happen 2 0 0 2  Total GAD 7 Score 18 10 2 14   Anxiety Difficulty Extremely difficult Extremely difficult Not difficult at all Somewhat difficult    Allergies  Allergen Reactions  . Atorvastatin     Memory loss  . Statins    Social History   Social History Narrative   Married. Has children.    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    Everyday smoker. Occasional alcohol. No drugs.    Past Medical History:  Diagnosis Date  . Allergy   . Chicken pox as a child  . CTS (carpal tunnel syndrome)    right  . Depression with anxiety   . Dermatitis 11/09/2011   Right arm  . Fatigue 11/09/2011  . Hidradenitis suppurativa 12/16/2011  . Hyperlipidemia   . Measles as a child  . Migraine 11/09/2011  . Mumps as a child  . 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 Perimenopausal   . Polydipsia 01/11/2018  . Tobacco abuse 11/09/2011  . Vitamin D deficiency    Past Surgical History:  Procedure Laterality Date  . LEEP  2004   Family History  Problem Relation Age of Onset  . Diabetes Mother        ?  Marland Kitchen Osteoporosis Mother   . Hyperlipidemia Father   . Heart disease Father        MI in 31  . Hypertension Father   . Heart attack Father 22  . Cancer Maternal Grandmother        lung- smoker  . Osteoporosis Maternal Grandmother   . Cancer Maternal Grandfather        prostate  . Cataracts Paternal Grandmother   . Other Paternal Grandfather        PAD  with gangrene  . Breast cancer Maternal Aunt    Allergies as of 05/07/2020      Reactions   Atorvastatin    Memory loss   Statins  Medication List       Accurate as of May 07, 2020  5:13 PM. If you have any questions, ask your nurse or doctor.        STOP taking these medications   calcium gluconate 500 MG tablet Stopped by: Felix Pacini, DO   cephALEXin 500 MG capsule Commonly known as: KEFLEX Stopped by: Felix Pacini, DO   cetirizine 10 MG tablet Commonly known as: ZYRTEC Stopped by: Felix Pacini, DO   CoQ10 100 MG Caps Stopped by: Felix Pacini, DO   fluticasone 50 MCG/ACT nasal spray Commonly known as: FLONASE Stopped by: Felix Pacini, DO   multivitamin tablet Stopped by: Felix Pacini, DO   SUPER B COMPLEX/C PO Stopped by: Felix Pacini, DO   TURMERIC CURCUMIN PO Stopped by: Felix Pacini, DO   vitamin B-12 1000 MCG tablet Commonly known as: CYANOCOBALAMIN Stopped by: Felix Pacini, DO   vitamin C 1000 MG tablet Stopped by: Felix Pacini, DO     TAKE these medications   alendronate 70 MG tablet Commonly known as: FOSAMAX Take 1 tablet (70 mg total) by mouth every 7 (seven) days. Take with a full glass of water on an empty stomach.   ARIPiprazole 5 MG tablet Commonly known as: Abilify Take 1 tablet (5 mg total) by mouth daily. Started by: Felix Pacini, DO   ezetimibe 10 MG tablet Commonly known as: Zetia Take 1 tablet (10 mg total) by mouth daily.   LORazepam 0.5 MG tablet Commonly known as: ATIVAN Take 1 tablet (0.5 mg total) by mouth 2 (two) times daily as needed for anxiety.   sodium chloride 1 g tablet Take 1 tablet (1 g total) by mouth daily.   venlafaxine XR 150 MG 24 hr capsule Commonly known as: EFFEXOR-XR TAKE ONE CAPSULE BY MOUTH EVERY DAY WITH BREAKFAST   Vitamin D3 25 MCG (1000 UT) Caps Take 2 capsules by mouth daily.       All past medical history, surgical history, allergies, family history, immunizations  andmedications were updated in the EMR today and reviewed under the history and medication portions of their EMR.     ROS: Negative, with the exception of above mentioned in HPI  Objective:  BP 118/69   Pulse 67   Temp (!) 97.5 F (36.4 C) (Oral)   Ht 5\' 4"  (1.626 m)   Wt 112 lb (50.8 kg)   LMP  (LMP Unknown)   SpO2 100%   BMI 19.22 kg/m  Body mass index is 19.22 kg/m. Gen: Afebrile. No acute distress.  Tearful.  Pleasant female HENT: AT. [place]. Eyes:Pupils Equal Round Reactive to light, Extraocular movements intact,  Conjunctiva without redness, discharge or icterus.  Neuro:  Normal gait. PERLA. EOMi. Alert. Oriented x3 . Psych: Moderately anxious, tearful, grief stricken.  Otherwise, normal affect, dress and demeanor. Normal speech. Normal thought content and judgment. .   No exam data present No results found. No results found for this or any previous visit (from the past 24 hour(s)).  Assessment/Plan: ELLON MARASCO is a 55 y.o. female present for OV for  Depression with anxiety/grieving -Lengthy discussion with patient and her husband today surrounding different approaches and medications to help better control her depression and anxiety. [place] database reviewed 05/07/20  -Continue Effexor 150 mg daily -Encouraged her to take the Ativan before bed and at lunchtime. -Start Abilify 5 mg daily Referral to psychology placed prior-she had not gone because of financial strain.  Now she has a new insurance and  would like referral to psychology placed again. Past medications: Wellbutrin, Lexapro, Seroquel, Remeron Vitamin D, B12 and thyroid levels have been tested and normal  - follow up 4 weeks-can taper on Abilify at that time if needed   Reviewed expectations re: course of current medical issues.  Discussed self-management of symptoms.  Outlined signs and symptoms indicating need for more acute intervention.  Patient verbalized understanding and all questions were  answered.  Patient received an After-Visit Summary.   Orders Placed This Encounter  Procedures  . Ambulatory referral to Psychology   Meds ordered this encounter  Medications  . ARIPiprazole (ABILIFY) 5 MG tablet    Sig: Take 1 tablet (5 mg total) by mouth daily.    Dispense:  30 tablet    Refill:  5  . venlafaxine XR (EFFEXOR-XR) 150 MG 24 hr capsule    Sig: TAKE ONE CAPSULE BY MOUTH EVERY DAY WITH BREAKFAST    Dispense:  90 capsule    Refill:  1  . LORazepam (ATIVAN) 0.5 MG tablet    Sig: Take 1 tablet (0.5 mg total) by mouth 2 (two) times daily as needed for anxiety.    Dispense:  60 tablet    Refill:  5    Referral Orders     Ambulatory referral to Psychology   Note is dictated utilizing voice recognition software. Although note has been proof read prior to signing, occasional typographical errors still can be missed. If any questions arise, please do not hesitate to call for verification.   electronically signed by:  Felix Pacini, DO  [facility] [place]

## 2020-05-07 NOTE — Patient Instructions (Signed)
Continue effexor.  Start abilify (before bed is ok)  Take ativan one tab before bed and one tab at lunch time.  Psychologist will call you to schedule also.   Follow up in 4 weeks  Homework: try a new hobby.   If you need anything do not hesitate to call.     Major Depressive Disorder, Adult Major depressive disorder is a mental health condition. This disorder affects feelings. It can also affect the body. Symptoms of this condition last most of the day, almost every day, for 2 weeks. This disorder can affect:  Relationships.  Daily activities, such as work and school.  Activities that you normally like to do. What are the causes? The cause of this condition is not known. The disorder is likely caused by a mix of things, including:  Your personality, such as being a shy person.  Your behavior, or how you act toward others.  Your thoughts and feelings.  Too much alcohol or drugs.  How you react to stress.  Health and mental problems that you have had for a long time.  Things that hurt you in the past (trauma).  Big changes in your life, such as divorce. What increases the risk? The following factors may make you more likely to develop this condition:  Having family members with depression.  Being a woman.  Problems in the family.  Low levels of some brain chemicals.  Things that caused you pain as a child, especially if you lost a parent or were abused.  A lot of stress in your life, such as from: ? Living without basic needs of life, such as food and shelter. ? Being treated poorly because of race, sex, or religion (discrimination).  Health and mental problems that you have had for a long time. What are the signs or symptoms? The main symptoms of this condition are:  Being sad all the time.  Being grouchy all the time.  Loss of interest in things and activities. Other symptoms include:  Sleeping too much or too little.  Eating too much or too  little.  Gaining or losing weight, without knowing why.  Feeling tired or having low energy.  Being restless and weak.  Feeling hopeless, worthless, or guilty.  Trouble thinking clearly or making decisions.  Thoughts of hurting yourself or others, or thoughts of ending your life.  Spending a lot of time alone.  Inability to complete common tasks of daily life. If you have very bad MDD, you may:  Believe things that are not true.  Hear, see, taste, or feel things that are not there.  Have mild depression that lasts for at least 2 years.  Feel very sad and hopeless.  Have trouble speaking or moving. How is this treated? This condition may be treated with:  Talk therapy. This teaches you to know bad thoughts, feelings, and actions and how to change them. ? This can also help you to communicate with others. ? This can be done with members of your family.  Medicines. These can be used to treat worry (anxiety), depression, or low levels of chemicals in the brain.  Lifestyle changes. You may need to: ? Limit alcohol use. ? Limit drug use. ? Get regular exercise. ? Get plenty of sleep. ? Make healthy eating choices. ? Spend more time outdoors.  Brain stimulation. This treatment excites the brain. This is done when symptoms are very bad or have not gotten better with other treatments. Follow these instructions at  home: Activity  Get regular exercise as told.  Spend time outdoors as told.  Make time to do the things you enjoy.  Find ways to deal with stress. Try to: ? Meditate. ? Do deep breathing. ? Spend time in nature. ? Keep a journal.  Return to your normal activities as told by your doctor. Ask your doctor what activities are safe for you. Alcohol and drug use  If you drink alcohol: ? Limit how much you use to:  0-1 drink a day for women.  0-2 drinks a day for men. ? Be aware of how much alcohol is in your drink. In the [place] one drink equals one 12  oz bottle of beer (355 mL), one 5 oz glass of wine (148 mL), or one 1 oz glass of hard liquor (44 mL).  Talk to your doctor about: ? Alcohol use. Alcohol can affect some medicines. ? Any drug use. General instructions  Take over-the-counter and prescription medicines and herbal preparations only as told by your doctor.  Eat a healthy diet.  Get a lot of sleep.  Think about joining a support group. Your doctor may be able to suggest one.  Keep all follow-up visits as told by your doctor. This is important.   Where to find more information:  [employer] on Mental Illness: www.nami.org  [place] [employer] of Mental Health: http://www.maynard.net/  American Psychiatric Association: www.psychiatry.org/patients-families/ Contact a doctor if:  Your symptoms get worse.  You get new symptoms. Get help right away if:  You hurt yourself.  You have serious thoughts about hurting yourself or others.  You see, hear, taste, smell, or feel things that are not there. If you ever feel like you may hurt yourself or others, or have thoughts about taking your own life, get help right away. Go to your nearest emergency department or:  Call your local emergency services (911 in the [place]  Call a suicide crisis helpline, such as the National Suicide Prevention Lifeline at 817-856-4628. This is open 24 hours a day in the [place]  Text the Crisis Text Line at 540 836 4441 (in the [place] Summary  Major depressive disorder is a mental health condition. This disorder affects feelings. Symptoms of this condition last most of the day, almost every day, for 2 weeks.  The symptoms of this disorder can cause problems with relationships and with daily activities.  There are treatments and support for people who get this disorder. You may need more than one type of treatment.  Get help right away if you have serious thoughts about hurting yourself or others. This information is not intended to replace  advice given to you by your health care provider. Make sure you discuss any questions you have with your health care provider. Document Revised: 03/03/2019 Document Reviewed: 03/03/2019 Elsevier Patient Education  2021 [employer].

## 2020-05-08 ENCOUNTER — Ambulatory Visit (INDEPENDENT_AMBULATORY_CARE_PROVIDER_SITE_OTHER): Payer: 59 | Admitting: Psychology

## 2020-05-08 ENCOUNTER — Ambulatory Visit: Payer: Self-pay | Admitting: Psychology

## 2020-05-08 DIAGNOSIS — F332 Major depressive disorder, recurrent severe without psychotic features: Secondary | ICD-10-CM

## 2020-05-19 ENCOUNTER — Ambulatory Visit (INDEPENDENT_AMBULATORY_CARE_PROVIDER_SITE_OTHER): Payer: 59 | Admitting: Psychology

## 2020-05-19 DIAGNOSIS — F332 Major depressive disorder, recurrent severe without psychotic features: Secondary | ICD-10-CM

## 2020-05-26 ENCOUNTER — Ambulatory Visit: Payer: 59 | Admitting: Psychology

## 2020-05-30 ENCOUNTER — Ambulatory Visit: Payer: 59 | Admitting: Psychology

## 2020-05-30 ENCOUNTER — Other Ambulatory Visit: Payer: Self-pay

## 2020-06-02 ENCOUNTER — Encounter: Payer: Self-pay | Admitting: Family Medicine

## 2020-06-02 ENCOUNTER — Other Ambulatory Visit: Payer: Self-pay

## 2020-06-02 ENCOUNTER — Ambulatory Visit: Payer: 59 | Admitting: Family Medicine

## 2020-06-02 VITALS — BP 104/61 | HR 68 | Temp 98.1°F | Ht 64.0 in | Wt 113.0 lb

## 2020-06-02 DIAGNOSIS — M818 Other osteoporosis without current pathological fracture: Secondary | ICD-10-CM | POA: Diagnosis not present

## 2020-06-02 DIAGNOSIS — F4321 Adjustment disorder with depressed mood: Secondary | ICD-10-CM

## 2020-06-02 DIAGNOSIS — F418 Other specified anxiety disorders: Secondary | ICD-10-CM | POA: Diagnosis not present

## 2020-06-02 DIAGNOSIS — E782 Mixed hyperlipidemia: Secondary | ICD-10-CM

## 2020-06-02 DIAGNOSIS — Z634 Disappearance and death of family member: Secondary | ICD-10-CM

## 2020-06-02 MED ORDER — ALENDRONATE SODIUM 70 MG PO TABS: 70.0000 mg | ORAL_TABLET | ORAL | 11 refills | Status: DC

## 2020-06-02 MED ORDER — PAROXETINE HCL 10 MG PO TABS: 10.0000 mg | ORAL_TABLET | Freq: Every day | ORAL | 1 refills | Status: DC

## 2020-06-02 NOTE — Patient Instructions (Signed)
Glad to see you today  Continue Effexor 150 mg ( 1 tab daily) Start Paxil 1 tab daily.   Follow up in 3 months. Sooner if needed.     http://NIMH.NIH.Gov">  Generalized Anxiety Disorder, Adult Generalized anxiety disorder (GAD) is a mental health condition. Unlike normal worries, anxiety related to GAD is not triggered by a specific event. These worries do not fade or get better with time. GAD interferes with relationships, work, and school. GAD symptoms can vary from mild to severe. People with severe GAD can have intense waves of anxiety with physical symptoms that are similar to panic attacks. What are the causes? The exact cause of GAD is not known, but the following are believed to have an impact:  Differences in natural brain chemicals.  Genes passed down from parents to children.  Differences in the way threats are perceived.  Development during childhood.  Personality. What increases the risk? The following factors may make you more likely to develop this condition:  Being female.  Having a family history of anxiety disorders.  Being very shy.  Experiencing very stressful life events, such as the death of a loved one.  Having a very stressful family environment. What are the signs or symptoms? People with GAD often worry excessively about many things in their lives, such as their health and family. Symptoms may also include:  Mental and emotional symptoms: ? Worrying excessively about natural disasters. ? Fear of being late. ? Difficulty concentrating. ? Fears that others are judging your performance.  Physical symptoms: ? Fatigue. ? Headaches, muscle tension, muscle twitches, trembling, or feeling shaky. ? Feeling like your heart is pounding or beating very fast. ? Feeling out of breath or like you cannot take a deep breath. ? Having trouble falling asleep or staying asleep, or experiencing restlessness. ? Sweating. ? Nausea, diarrhea, or irritable bowel  syndrome (IBS).  Behavioral symptoms: ? Experiencing erratic moods or irritability. ? Avoidance of new situations. ? Avoidance of people. ? Extreme difficulty making decisions. How is this diagnosed? This condition is diagnosed based on your symptoms and medical history. You will also have a physical exam. Your health care provider may perform tests to rule out other possible causes of your symptoms. To be diagnosed with GAD, a person must have anxiety that:  Is out of his or her control.  Affects several different aspects of his or her life, such as work and relationships.  Causes distress that makes him or her unable to take part in normal activities.  Includes at least three symptoms of GAD, such as restlessness, fatigue, trouble concentrating, irritability, muscle tension, or sleep problems. Before your health care provider can confirm a diagnosis of GAD, these symptoms must be present more days than they are not, and they must last for 6 months or longer. How is this treated? This condition may be treated with:  Medicine. Antidepressant medicine is usually prescribed for long-term daily control. Anti-anxiety medicines may be added in severe cases, especially when panic attacks occur.  Talk therapy (psychotherapy). Certain types of talk therapy can be helpful in treating GAD by providing support, education, and guidance. Options include: ? Cognitive behavioral therapy (CBT). People learn coping skills and self-calming techniques to ease their physical symptoms. They learn to identify unrealistic thoughts and behaviors and to replace them with more appropriate thoughts and behaviors. ? Acceptance and commitment therapy (ACT). This treatment teaches people how to be mindful as a way to cope with unwanted thoughts and feelings. ?  Biofeedback. This process trains you to manage your body's response (physiological response) through breathing techniques and relaxation methods. You will work  with a therapist while machines are used to monitor your physical symptoms.  Stress management techniques. These include yoga, meditation, and exercise. A mental health specialist can help determine which treatment is best for you. Some people see improvement with one type of therapy. However, other people require a combination of therapies.   Follow these instructions at home: Lifestyle  Maintain a consistent routine and schedule.  Anticipate stressful situations. Create a plan, and allow extra time to work with your plan.  Practice stress management or self-calming techniques that you have learned from your therapist or your health care provider. General instructions  Take over-the-counter and prescription medicines only as told by your health care provider.  Understand that you are likely to have setbacks. Accept this and be kind to yourself as you persist to take better care of yourself.  Recognize and accept your accomplishments, even if you judge them as small.  Keep all follow-up visits as told by your health care provider. This is important. Contact a health care provider if:  Your symptoms do not get better.  Your symptoms get worse.  You have signs of depression, such as: ? A persistently sad or irritable mood. ? Loss of enjoyment in activities that used to bring you joy. ? Change in weight or eating. ? Changes in sleeping habits. ? Avoiding friends or family members. ? Loss of energy for normal tasks. ? Feelings of guilt or worthlessness. Get help right away if:  You have serious thoughts about hurting yourself or others. If you ever feel like you may hurt yourself or others, or have thoughts about taking your own life, get help right away. Go to your nearest emergency department or:  Call your local emergency services (911 in the [place]  Call a suicide crisis helpline, such as the National Suicide Prevention Lifeline at (361)658-1358. This is open 24 hours a day  in the [place]  Text the Crisis Text Line at 760-389-6058 (in the [place] Summary  Generalized anxiety disorder (GAD) is a mental health condition that involves worry that is not triggered by a specific event.  People with GAD often worry excessively about many things in their lives, such as their health and family.  GAD may cause symptoms such as restlessness, trouble concentrating, sleep problems, frequent sweating, nausea, diarrhea, headaches, and trembling or muscle twitching.  A mental health specialist can help determine which treatment is best for you. Some people see improvement with one type of therapy. However, other people require a combination of therapies. This information is not intended to replace advice given to you by your health care provider. Make sure you discuss any questions you have with your health care provider. Document Revised: 01/10/2019 Document Reviewed: 01/10/2019 Elsevier Patient Education  2021 [employer].

## 2020-06-02 NOTE — Progress Notes (Signed)
This visit occurred during the SARS-CoV-2 public health emergency.  Safety protocols were in place, including screening questions prior to the visit, additional usage of staff PPE, and extensive cleaning of exam room while observing appropriate contact time as indicated for disinfecting solutions.    Michelle Arnold , 1966-04-04, 56 y.o., female MRN: [id number] Patient Care Team    Relationship Specialty Notifications Start End  Natalia Leatherwood, DO PCP - General Family Medicine  11/03/15     Chief Complaint  Patient presents with  . Follow-up    CMC; pt is not fasting      Subjective: Pt presents for an OV to follow-up on her anxiety, medication changes and hyponatremia.  Depression/anxiety: Since her last visit patient's son unexpectedly passed away and now her brother also passed away last week. She has established with psychology. She felt the abilify was helpful, but caused her to have suicidal thoughts. She feels she is moving forward in her grief, but is not where she would like to be. She has a good support system at home. She reports she is sleeping well and getting up in the morning. Sheis not laying around like she did prior.  Prior note:   She is not interacting with family.  The care of her other children has been shifted to her mother and husband.  She wants to get back to her regular life, but feels sad and anxious.  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 She reports she feels she is dealing with it and doing ok on the effexor 150 mg QD. She is taking 1 salt tab a day for her hyponatremia. She has not needed to use the ativan.  Pt reports CT lung screen is not covered by her insurance. Prior note:  After last appointment patient was found to have low sodium. Her Effexor dose was decreased to 150 mg daily and patient was encouraged to start supplementing with V8 or  Gatorade servings daily to help increase sodium levels. Ativan 0.5 mg twice daily was added to start to help with anxiety. Patient reports she is only used this once since prescribed. Patient reports she feels mildly improved since her last visit. Her husband feels she is still fatigued and unmotivated. He still sees a change from her baseline. Prior note: Patient reports she is "stuck on her thoughts."She feels she gets locked up or freezes and cannot think well.  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  She reports she just froze and cannot think of what to do next.  An example her husband gives is when they were driving home from the beach in separate cars, she was so fearful she was going to get lost and he was going to "lose her. "He reports he was right in front of her, and she did not see him and missed a turn when he turned.  She recalls a situation of feeling panicked and not knowing what to do.  He has had stressors surrounding their business, financial, she is caring for 2 of her grandchildren and her mother lives in their home.  Her son almost passed away a few months ago, and she found him in time-which saved his life. Has been reports overall he thinks she sleeps well.  She does talk in  her sleep on occasions and she does snore.  He reports that her entire motivation and daily routine has changed over the last few months.  She used to get up have a couple coffee, smoke a cigarette and do her devotions and now she will get up and frequently go back to bed for a nap.  She reports she feels overwhelmed and does not want to deal with the day.  She reports food no longer tastes good.  She is an everyday smoker.  She has lost weight.  Her husband states she does not eat much.   [facility] 2[facility] 06/02/2020 05/07/2020 01/30/2020 10/30/2019 08/06/2019  Decreased Interest 1 3 0 3 1  Down, Depressed, Hopeless 1 3 0 3 1  PHQ - 2  Score 2 6 0 6 2  Altered sleeping 1 3 - 3 0  Tired, decreased energy 1 3 - 0 3  Change in appetite 0 3 - 3 1  Feeling bad or failure about yourself  1 3 - 3 1  Trouble concentrating 0 2 - 3 3  Moving slowly or fidgety/restless 0 2 - 0 0  Suicidal thoughts 0 0 - 0 0  PHQ-9 Score 5 22 - 18 10  Difficult doing work/chores - - - Extremely dIfficult Somewhat difficult  Some recent data might be hidden   GAD 7 : Generalized Anxiety Score 06/02/2020 05/07/2020 10/30/2019 08/06/2019  Nervous, Anxious, on Edge 1 3 3  0  Control/stop worrying 1 3 3  0  Worry too much - different things 1 3 3 1   Trouble relaxing 1 3 1 1   Restless 1 2 0 0  Easily annoyed or irritable 1 2 0 0  Afraid - awful might happen 1 2 0 0  Total GAD 7 Score 7 18 10 2   Anxiety Difficulty - Extremely difficult Extremely difficult Not difficult at all    Allergies  Allergen Reactions  . Atorvastatin     Memory loss  . Statins    Social History   Social History Narrative   Married. Has children.    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    Everyday smoker. Occasional alcohol. No drugs.    Past Medical History:  Diagnosis Date  . Allergy   . Chicken pox as a child  . CTS (carpal tunnel syndrome)    right  . Depression with anxiety   . Dermatitis 11/09/2011   Right arm  . Fatigue 11/09/2011  . Hidradenitis suppurativa 12/16/2011  . Hyperlipidemia   . Measles as a child  . Migraine 11/09/2011  . Mumps as a child  . 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 Perimenopausal   . Polydipsia 01/11/2018  . Tobacco abuse 11/09/2011  . Vitamin D deficiency    Past Surgical History:  Procedure Laterality Date  . LEEP  2004   Family History  Problem Relation Age of Onset  . Diabetes Mother        ?  Marland Kitchen. Osteoporosis Mother   . Hyperlipidemia Father   . Heart disease Father        MI in [id number]  . Hypertension Father   . Heart attack Father [id number]  . Cancer  Maternal Grandmother        lung- smoker  . Osteoporosis Maternal Grandmother   . Cancer Maternal Grandfather        prostate  . Cataracts Paternal Grandmother   .  Other Paternal Grandfather        PAD with gangrene  . Breast cancer Maternal Aunt    Allergies as of 06/02/2020      Reactions   Atorvastatin    Memory loss   Statins       Medication List       Accurate as of June 02, 2020 11:00 AM. If you have any questions, ask your nurse or doctor.        STOP taking these medications   ARIPiprazole 5 MG tablet Commonly known as: Abilify Stopped by: Michelle Pacini, DO   sodium chloride 1 g tablet Stopped by: Michelle Pacini, DO     TAKE these medications   alendronate 70 MG tablet Commonly known as: FOSAMAX Take 1 tablet (70 mg total) by mouth every 7 (seven) days. Take with a full glass of water on an empty stomach.   ezetimibe 10 MG tablet Commonly known as: Zetia Take 1 tablet (10 mg total) by mouth daily.   LORazepam 0.5 MG tablet Commonly known as: ATIVAN Take 1 tablet (0.5 mg total) by mouth 2 (two) times daily as needed for anxiety.   PARoxetine 10 MG tablet Commonly known as: Paxil Take 1 tablet (10 mg total) by mouth daily. Started by: Michelle Pacini, DO   venlafaxine XR 150 MG 24 hr capsule Commonly known as: EFFEXOR-XR TAKE ONE CAPSULE BY MOUTH EVERY DAY WITH BREAKFAST   Vitamin D3 25 MCG (1000 UT) Caps Take 2 capsules by mouth daily.       All past medical history, surgical history, allergies, family history, immunizations andmedications were updated in the EMR today and reviewed under the history and medication portions of their EMR.     ROS: Negative, with the exception of above mentioned in HPI  Objective:  BP 104/61   Pulse 68   Temp 98.1 F (36.7 C) (Oral)   Ht 5\' 4"  (1.626 m)   Wt 113 lb (51.3 kg)   LMP  (LMP Unknown)   SpO2 100%   BMI 19.40 kg/m  Body mass index is 19.4 kg/m. Gen: Afebrile. No acute distress. Nontoxic  pleasant female. Relaxed appearing today HENT: AT. [place].  Eyes:Pupils Equal Round Reactive to light, Extraocular movements intact,  Conjunctiva without redness, discharge or icterus. Neuro:  Normal gait. PERLA. EOMi. Alert. Oriented x3 Psych: Normal affect, dress and demeanor. Normal speech. Normal thought content and judgment.    No exam data present No results found. No results found for this or any previous visit (from the past 24 hour(s)).  Assessment/Plan: MCKAILA DUFFUS is a 55 y.o. female present for OV for  Depression with anxiety/grieving -improved from last appt- but not quit where she wants to be.  [place] database reviewed 06/02/20  -continue effexor 150 mg daily - DC abilify Stat paxil 10 qd -Encouraged her to take the Ativan before bed and at lunchtime> she is still only taking as needed.  Referred to psychology> est w 06/04/20 per pt.  Vitamin D, B12 and thyroid levels have been tested and normal  - f/u 3 mos- sooner if needed.    osteoporosis without current pathological fracture Continue fosamax once weekly.   Mixed hyperlipidemia Stable.  Continue zeita Labs due next visit.     Reviewed expectations re: course of current medical issues.  Discussed self-management of symptoms.  Outlined signs and symptoms indicating need for more acute intervention.  Patient verbalized understanding and all questions were answered.  Patient received an After-Visit  Summary.   No orders of the defined types were placed in this encounter.  Meds ordered this encounter  Medications  . PARoxetine (PAXIL) 10 MG tablet    Sig: Take 1 tablet (10 mg total) by mouth daily.    Dispense:  90 tablet    Refill:  1  . alendronate (FOSAMAX) 70 MG tablet    Sig: Take 1 tablet (70 mg total) by mouth every 7 (seven) days. Take with a full glass of water on an empty stomach.    Dispense:  4 tablet    Refill:  11   Referral Orders  No referral(s) requested today     Note is  dictated utilizing voice recognition software. Although note has been proof read prior to signing, occasional typographical errors still can be missed. If any questions arise, please do not hesitate to call for verification.   electronically signed by:  Michelle Pacini, DO  [facility] - [place]

## 2020-06-09 ENCOUNTER — Ambulatory Visit (INDEPENDENT_AMBULATORY_CARE_PROVIDER_SITE_OTHER): Payer: 59 | Admitting: Psychology

## 2020-06-09 DIAGNOSIS — F332 Major depressive disorder, recurrent severe without psychotic features: Secondary | ICD-10-CM | POA: Diagnosis not present

## 2020-06-26 ENCOUNTER — Ambulatory Visit: Payer: 59 | Admitting: Psychology

## 2020-07-07 ENCOUNTER — Ambulatory Visit: Payer: 59 | Admitting: Psychology

## 2020-07-21 ENCOUNTER — Ambulatory Visit: Payer: 59 | Admitting: Psychology

## 2020-07-31 ENCOUNTER — Other Ambulatory Visit: Payer: Self-pay | Admitting: Family Medicine

## 2020-08-04 ENCOUNTER — Ambulatory Visit: Payer: 59 | Admitting: Psychology

## 2020-08-18 ENCOUNTER — Ambulatory Visit: Payer: 59 | Admitting: Psychology

## 2020-08-20 IMAGING — MG DIGITAL SCREENING BILAT W/ TOMO W/ CAD
8 series · 9 of 24 positions shown · non-contrast
Comparison: Previous exam(s).

CLINICAL DATA: Screening.

EXAM:
DIGITAL SCREENING BILATERAL MAMMOGRAM WITH TOMO AND CAD

[R CC synth-2D]
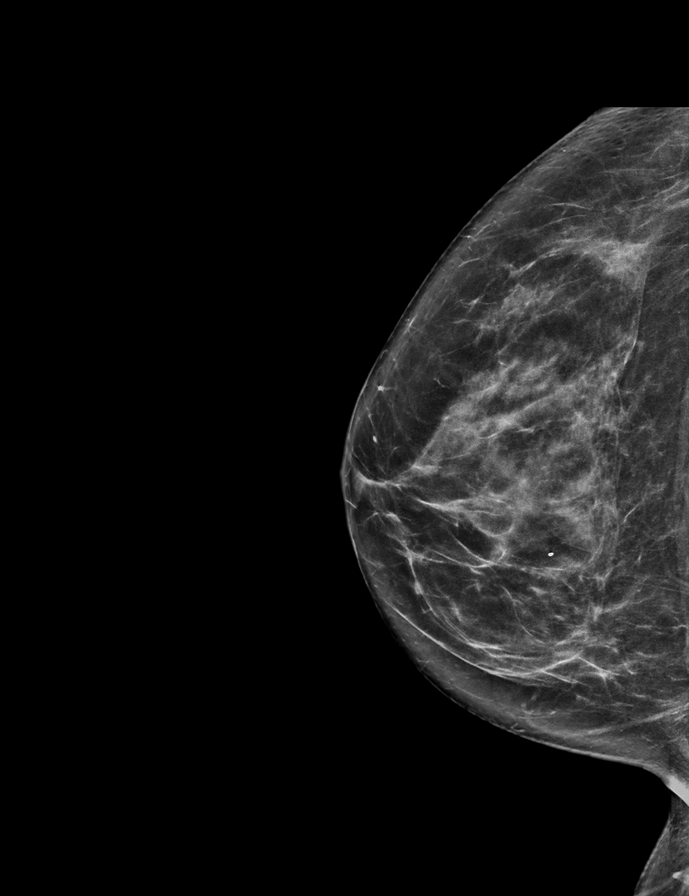

[R MLO synth-2D]
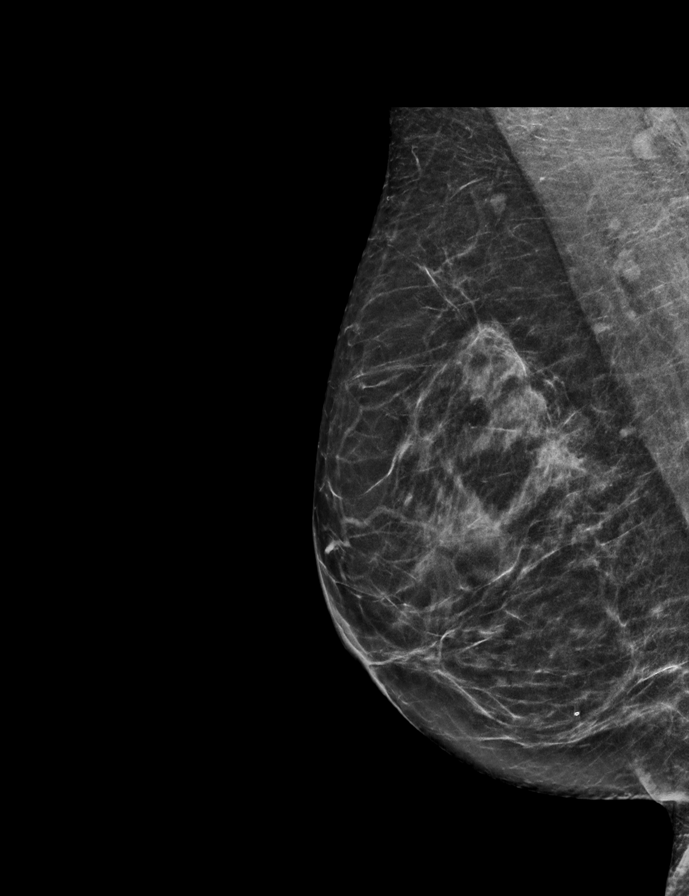

[L MLO synth-2D]
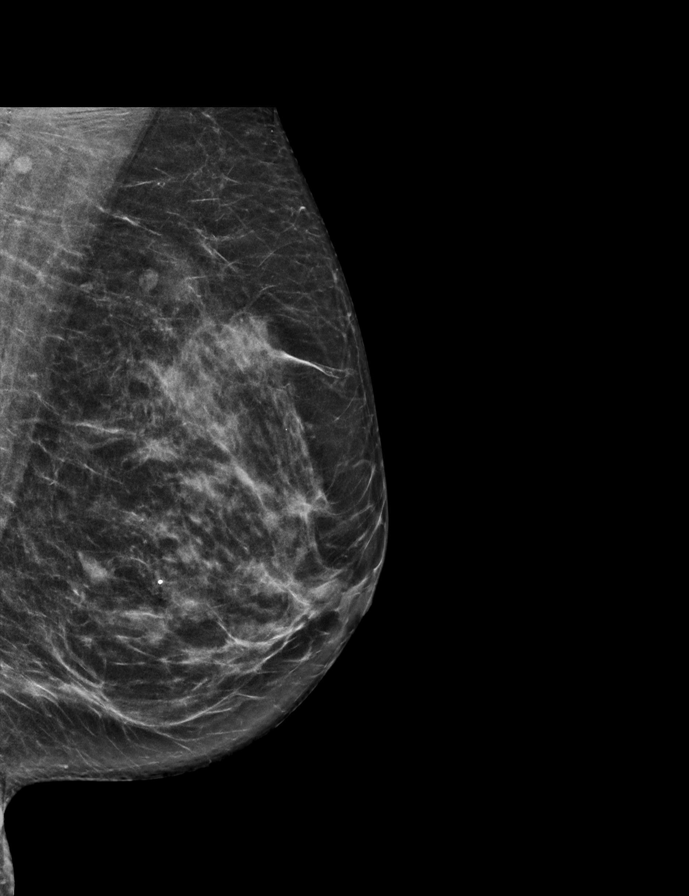

[L CC synth-2D]
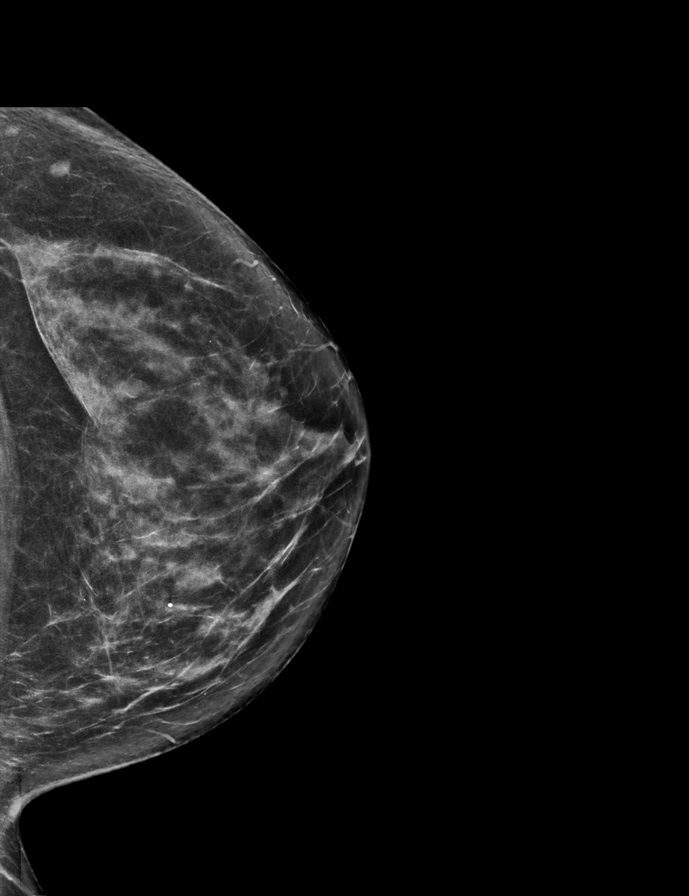

[R CC tomo · 2 of 66 frames shown]
[frame 22/66]
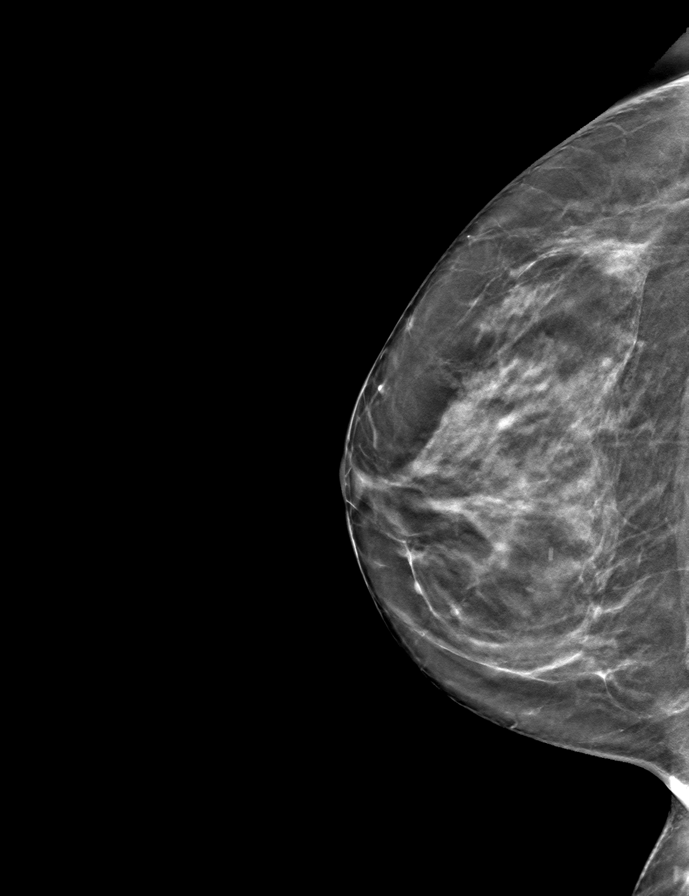
[frame 33/66]
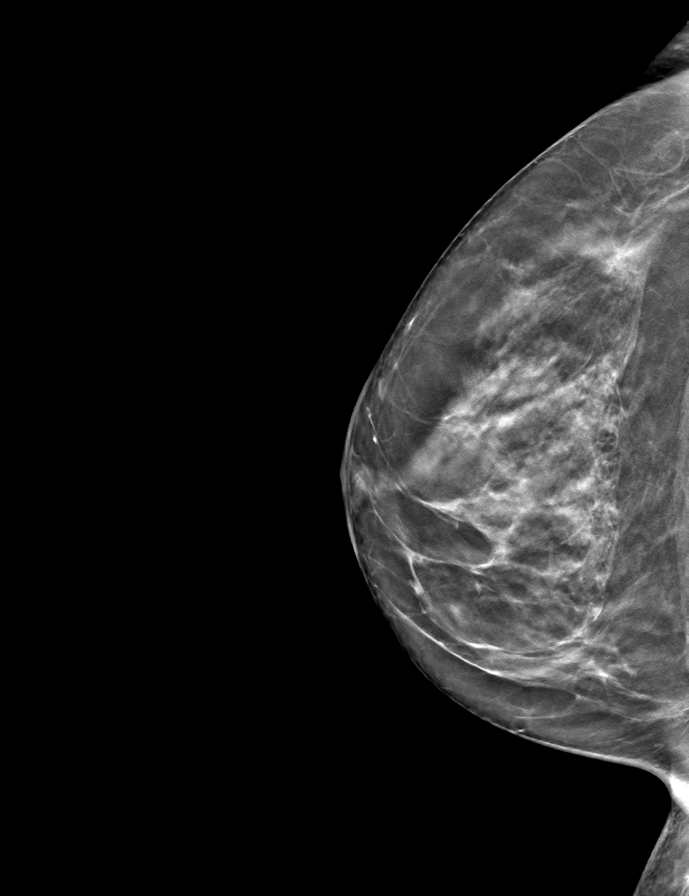

[R MLO tomo · tomo slice 31/61.0]
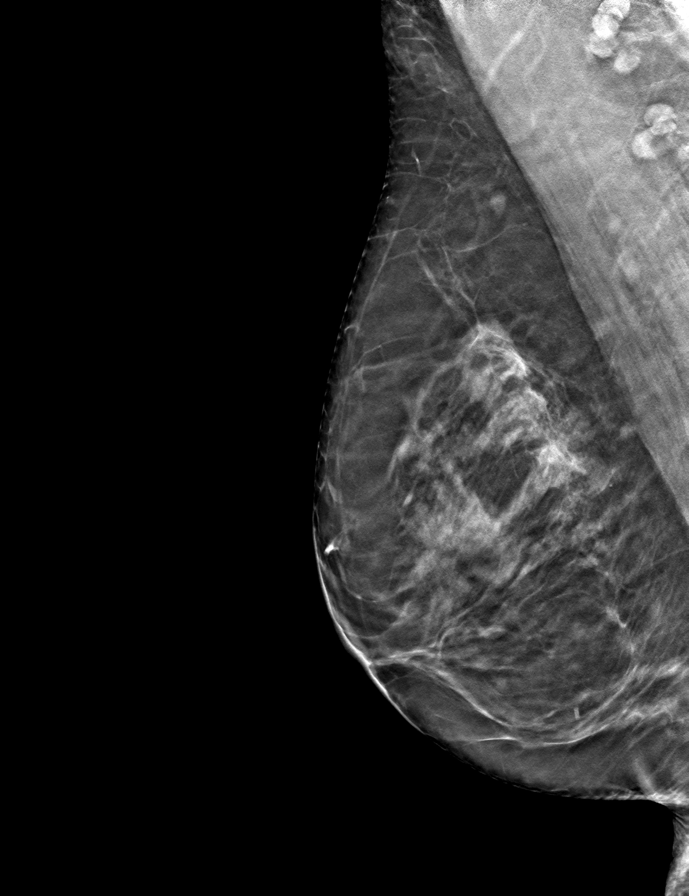

[L CC tomo · tomo slice 35/68.0]
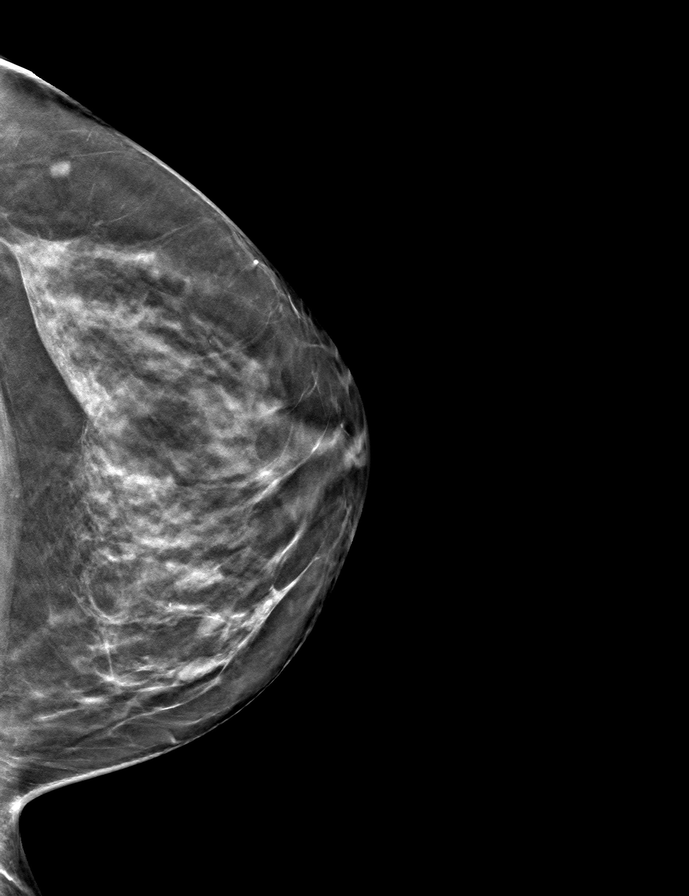

[L MLO tomo · tomo slice 30/59.0]
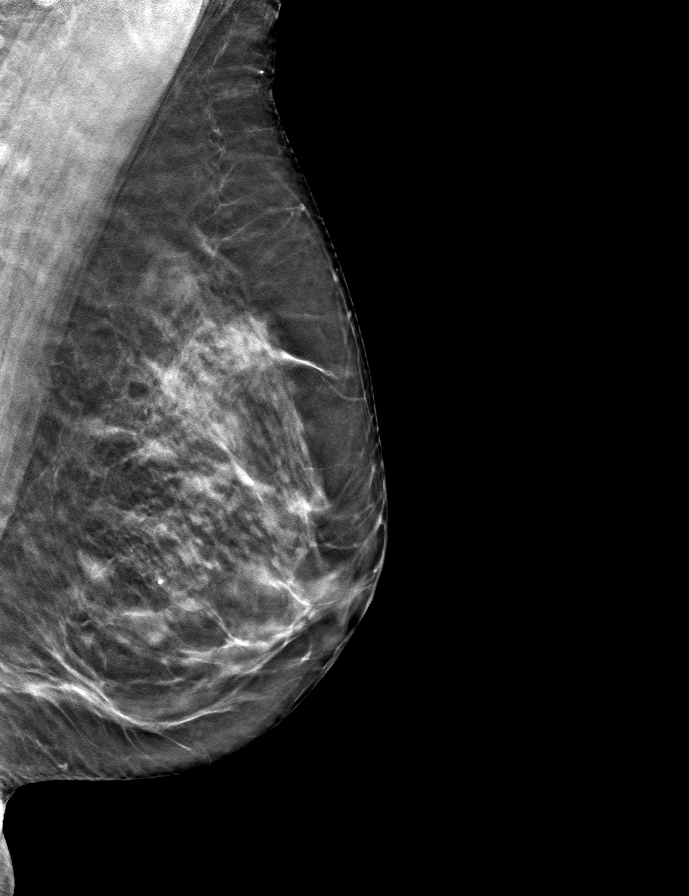

[9 of 24 positions shown; findings below may reference images not displayed]

ACR Breast Density Category c: The breast tissue is heterogeneously
dense, which may obscure small masses.
FINDINGS: There are no findings suspicious for malignancy. Images were
processed with CAD.
IMPRESSION: No mammographic evidence of malignancy. A result letter of this
screening mammogram will be mailed directly to the patient.

RECOMMENDATION:
Screening mammogram in one year. (Code:[id number])

BI-RADS CATEGORY  1: Negative.

## 2020-08-26 ENCOUNTER — Other Ambulatory Visit: Payer: Self-pay

## 2020-08-26 ENCOUNTER — Encounter: Payer: Self-pay | Admitting: Family Medicine

## 2020-08-26 ENCOUNTER — Ambulatory Visit: Payer: 59 | Admitting: Family Medicine

## 2020-08-26 ENCOUNTER — Other Ambulatory Visit (HOSPITAL_COMMUNITY)
Admission: RE | Admit: 2020-08-26 | Discharge: 2020-08-26 | Disposition: A | Discharge status: Home / Self Care | Payer: 59 | Source: Ambulatory Visit | Attending: Family Medicine | Admitting: Family Medicine

## 2020-08-26 VITALS — BP 96/62 | HR 82 | Temp 98.4°F | Ht 64.0 in | Wt 111.0 lb

## 2020-08-26 DIAGNOSIS — R8290 Unspecified abnormal findings in urine: Secondary | ICD-10-CM | POA: Insufficient documentation

## 2020-08-26 DIAGNOSIS — F418 Other specified anxiety disorders: Secondary | ICD-10-CM

## 2020-08-26 DIAGNOSIS — R350 Frequency of micturition: Secondary | ICD-10-CM

## 2020-08-26 DIAGNOSIS — F4321 Adjustment disorder with depressed mood: Secondary | ICD-10-CM

## 2020-08-26 LAB — POCT URINALYSIS DIPSTICK
Bilirubin, UA: NEGATIVE
Blood, UA: POSITIVE
Glucose, UA: NEGATIVE
Ketones, UA: NEGATIVE
Nitrite, UA: NEGATIVE
Protein, UA: NEGATIVE
Spec Grav, UA: 1.015 (ref 1.010–1.025)
Urobilinogen, UA: 0.2 E.U./dL
pH, UA: 6.5 (ref 5.0–8.0)

## 2020-08-26 MED ORDER — CEPHALEXIN 500 MG PO CAPS: 500.0000 mg | ORAL_CAPSULE | Freq: Three times a day (TID) | ORAL | 0 refills | Status: DC

## 2020-08-26 MED ORDER — EZETIMIBE 10 MG PO TABS: 10.0000 mg | ORAL_TABLET | Freq: Every day | ORAL | 3 refills | Status: DC

## 2020-08-26 NOTE — Patient Instructions (Signed)
  Effexor in the morning.  Paxil before bed.  Ativan when feeling extra shaky or nervous.   Pick up Keflex antibiotic at pharmacy and start - take every 8 hours.

## 2020-08-26 NOTE — Progress Notes (Signed)
This visit occurred during the SARS-CoV-2 public health emergency.  Safety protocols were in place, including screening questions prior to the visit, additional usage of staff PPE, and extensive cleaning of exam room while observing appropriate contact time as indicated for disinfecting solutions.    Michelle Arnold , 22-Jun-1965, 55 y.o., female MRN: [id number] Patient Care Team    Relationship Specialty Notifications Start End  Natalia Leatherwood, DO PCP - General Family Medicine  11/03/15     Chief Complaint  Patient presents with  . Urinary Frequency    Pt c/o urinary freq, foul odor, dysuria x 1 mo;      Subjective: Pt presents for an OV with complaints of urinary frequency of 1 month duration.  Associated symptoms include urinary odor and burning with urination.  Symptoms started after use of lubricant.  She denies vaginal irritation or discharge.  Patient changed chronic medical condition appointment to acute appointment today.  We briefly discussed her chronic medical conditions and devised a plan.  She will follow-up in an additional 4 weeks on her chronic conditions  Depression screen [facility] 2/9 06/02/2020 05/07/2020 01/30/2020 10/30/2019 08/06/2019  Decreased Interest 1 3 0 3 1  Down, Depressed, Hopeless 1 3 0 3 1  PHQ - 2 Score 2 6 0 6 2  Altered sleeping 1 3 - 3 0  Tired, decreased energy 1 3 - 0 3  Change in appetite 0 3 - 3 1  Feeling bad or failure about yourself  1 3 - 3 1  Trouble concentrating 0 2 - 3 3  Moving slowly or fidgety/restless 0 2 - 0 0  Suicidal thoughts 0 0 - 0 0  PHQ-9 Score 5 22 - 18 10  Difficult doing work/chores - - - Extremely dIfficult Somewhat difficult  Some recent data might be hidden    Allergies  Allergen Reactions  . Abilify [Aripiprazole] Other (See Comments)    Suicidal thoughts.   . Atorvastatin     Memory loss  . Statins    Social History   Social History Narrative   Married. Has children.    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    Everyday  smoker. Occasional alcohol. No drugs.    Past Medical History:  Diagnosis Date  . Allergy   . Chicken pox as a child  . CTS (carpal tunnel syndrome)    right  . Depression with anxiety   . Dermatitis 11/09/2011   Right arm  . Fatigue 11/09/2011  . Hidradenitis suppurativa 12/16/2011  . Hyperlipidemia   . Measles as a child  . Migraine 11/09/2011  . Mumps as a child  . 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 Perimenopausal   . Polydipsia 01/11/2018  . Tobacco abuse 11/09/2011  . Vitamin D deficiency    Past Surgical History:  Procedure Laterality Date  . LEEP  2004   Family History  Problem Relation Age of Onset  . Diabetes Mother        ?  Marland Kitchen Osteoporosis Mother   . Hyperlipidemia Father   . Heart disease Father        MI in 27  . Hypertension Father   . Heart attack Father 54  . Cancer Maternal Grandmother        lung- smoker  . Osteoporosis Maternal Grandmother   . Cancer Maternal Grandfather  prostate  . Cataracts Paternal Grandmother   . Other Paternal Grandfather        PAD with gangrene  . Breast cancer Maternal Aunt    Allergies as of 08/26/2020      Reactions   Abilify [aripiprazole] Other (See Comments)   Suicidal thoughts.    Atorvastatin    Memory loss   Statins       Medication List       Accurate as of Aug 26, 2020  2:34 PM. If you have any questions, ask your nurse or doctor.        alendronate 70 MG tablet Commonly known as: FOSAMAX Take 1 tablet (70 mg total) by mouth every 7 (seven) days. Take with a full glass of water on an empty stomach.   cephALEXin 500 MG capsule Commonly known as: KEFLEX Take 1 capsule (500 mg total) by mouth 3 (three) times daily. Started by: Felix Pacini, DO   ezetimibe 10 MG tablet Commonly known as: ZETIA Take 1 tablet (10 mg total) by mouth daily.   LORazepam 0.5 MG tablet Commonly known as: ATIVAN Take 1 tablet  (0.5 mg total) by mouth 2 (two) times daily as needed for anxiety.   PARoxetine 10 MG tablet Commonly known as: Paxil Take 1 tablet (10 mg total) by mouth daily.   venlafaxine XR 150 MG 24 hr capsule Commonly known as: EFFEXOR-XR TAKE ONE CAPSULE BY MOUTH EVERY DAY WITH BREAKFAST   Vitamin D3 25 MCG (1000 UT) Caps Take 2 capsules by mouth daily.       All past medical history, surgical history, allergies, family history, immunizations andmedications were updated in the EMR today and reviewed under the history and medication portions of their EMR.     ROS: Negative, with the exception of above mentioned in HPI   Objective:  BP 96/62   Pulse 82   Temp 98.4 F (36.9 C) (Oral)   Ht 5\' 4"  (1.626 m)   Wt 111 lb (50.3 kg)   LMP  (LMP Unknown)   SpO2 100%   BMI 19.05 kg/m  Body mass index is 19.05 kg/m. Gen: Afebrile. No acute distress. Nontoxic in appearance, well developed, well nourished.  HENT: AT. . Eyes:Pupils Equal Round Reactive to light, Extraocular movements intact,  Conjunctiva without redness, discharge or icterus. Neck/lymp/endocrine: Supple, no lymphadenopathy CV: RRR  Chest: CTAB  Abd: Soft. NTND. BS present MSK: No CVA tenderness Neuro:  Normal gait. PERLA. EOMi. Alert. Oriented x3  Psych: Mildly anxious, otherwise normal affect, dress and demeanor. Normal speech. Normal thought content and judgment.  No exam data present No results found. Results for orders placed or performed in visit on 08/26/20 (from the past 24 hour(s))  POCT Urinalysis Dipstick     Status: Abnormal   Collection Time: 08/26/20 10:13 AM  Result Value Ref Range   Color, UA Light Yellow    Clarity, UA Clear    Glucose, UA Negative Negative   Bilirubin, UA Negative    Ketones, UA Negative    Spec Grav, UA 1.015 1.010 - 1.025   Blood, UA Positive    pH, UA 6.5 5.0 - 8.0   Protein, UA Negative Negative   Urobilinogen, UA 0.2 0.2 or 1.0 E.U./dL   Nitrite, UA Negative     Leukocytes, UA Small (1+) (A) Negative   Appearance Clear    Odor No     Assessment/Plan: Michelle Arnold is a 55 y.o. female present for OV for  Urinary  frequency/abnormal urine odor Given length of time elected to treat with Keflex 500 mg 3 times daily while we wait on urine cultures.  Also sent for urine cytology to ensure not bacterial vaginosis. Encouraged her to make sure she is using water-based lubricant. - POCT Urinalysis Dipstick - Urinalysis w microscopic + reflex cultur - cephALEXin (KEFLEX) 500 MG capsule; Take 1 capsule (500 mg total) by mouth 3 (three) times daily.  Dispense: 21 capsule; Refill: 0 - Urine cytology ancillary only()  Patient changed chronic medical condition appointment to acute appointment today.  We briefly discussed her chronic medical conditions and devised a plan.  She will continue Effexor 75 mg daily, start the Paxil which she did not start before bed and use the Ativan every 12 hours as needed.  She will follow-up in an additional 4 weeks on her chronic conditions   Reviewed expectations re: course of current medical issues.  Discussed self-management of symptoms.  Outlined signs and symptoms indicating need for more acute intervention.  Patient verbalized understanding and all questions were answered.  Patient received an After-Visit Summary.    Orders Placed This Encounter  Procedures  . Urinalysis w microscopic + reflex cultur  . POCT Urinalysis Dipstick   Meds ordered this encounter  Medications  . cephALEXin (KEFLEX) 500 MG capsule    Sig: Take 1 capsule (500 mg total) by mouth 3 (three) times daily.    Dispense:  21 capsule    Refill:  0  . ezetimibe (ZETIA) 10 MG tablet    Sig: Take 1 tablet (10 mg total) by mouth daily.    Dispense:  90 tablet    Refill:  3    MUST HAVE OV FOR FURTHER REFILLS   Referral Orders  No referral(s) requested today     Note is dictated utilizing voice recognition software. Although  note has been proof read prior to signing, occasional typographical errors still can be missed. If any questions arise, please do not hesitate to call for verification.   electronically signed by:  Felix Pacini, DO   [facility] - [place]

## 2020-08-27 LAB — URINE CYTOLOGY ANCILLARY ONLY
Bacterial Vaginitis-Urine: NEGATIVE
Candida Urine: NEGATIVE

## 2020-08-29 LAB — URINE CULTURE
MICRO NUMBER:: 11932491
SPECIMEN QUALITY:: ADEQUATE

## 2020-08-29 LAB — URINALYSIS W MICROSCOPIC + REFLEX CULTURE
Bilirubin Urine: NEGATIVE
Glucose, UA: NEGATIVE
Hyaline Cast: NONE SEEN /LPF
Ketones, ur: NEGATIVE
Nitrites, Initial: NEGATIVE
Protein, ur: NEGATIVE
RBC / HPF: NONE SEEN /HPF (ref 0–2)
Specific Gravity, Urine: 1.004 (ref 1.001–1.035)
pH: 7 (ref 5.0–8.0)

## 2020-08-29 LAB — CULTURE INDICATED

## 2020-09-02 ENCOUNTER — Ambulatory Visit: Payer: 59 | Admitting: Family Medicine

## 2020-09-03 ENCOUNTER — Telehealth: Payer: Self-pay | Admitting: Family Medicine

## 2020-09-03 NOTE — Telephone Encounter (Signed)
LVM for pt to CB regarding results.  

## 2020-09-03 NOTE — Telephone Encounter (Signed)
Please inform patient her urinalysis did show that she had a E. coli urinary tract infection.  The medication prescribed to her during her appointment will appropriately treat that urinary tract infection.  Hope she is feeling better.

## 2020-09-03 NOTE — Telephone Encounter (Signed)
Spoke with patient regarding results/recommendations. She is feeling much better and has finished medication prescribed.

## 2020-09-23 ENCOUNTER — Ambulatory Visit: Payer: 59 | Admitting: Family Medicine

## 2020-09-25 ENCOUNTER — Other Ambulatory Visit: Payer: Self-pay

## 2020-09-25 ENCOUNTER — Encounter: Payer: Self-pay | Admitting: Family Medicine

## 2020-09-25 ENCOUNTER — Ambulatory Visit: Payer: 59 | Admitting: Family Medicine

## 2020-09-25 VITALS — BP 101/64 | HR 82 | Temp 98.3°F | Ht 64.0 in | Wt 114.0 lb

## 2020-09-25 DIAGNOSIS — E782 Mixed hyperlipidemia: Secondary | ICD-10-CM

## 2020-09-25 DIAGNOSIS — F418 Other specified anxiety disorders: Secondary | ICD-10-CM

## 2020-09-25 DIAGNOSIS — E871 Hypo-osmolality and hyponatremia: Secondary | ICD-10-CM | POA: Diagnosis not present

## 2020-09-25 DIAGNOSIS — F4321 Adjustment disorder with depressed mood: Secondary | ICD-10-CM

## 2020-09-25 DIAGNOSIS — M818 Other osteoporosis without current pathological fracture: Secondary | ICD-10-CM | POA: Diagnosis not present

## 2020-09-25 DIAGNOSIS — Z634 Disappearance and death of family member: Secondary | ICD-10-CM

## 2020-09-25 DIAGNOSIS — R319 Hematuria, unspecified: Secondary | ICD-10-CM

## 2020-09-25 LAB — CBC WITH DIFFERENTIAL/PLATELET
Basophils Absolute: 0.1 10*3/uL (ref 0.0–0.1)
Basophils Relative: 0.9 % (ref 0.0–3.0)
Eosinophils Absolute: 0.1 10*3/uL (ref 0.0–0.7)
Eosinophils Relative: 0.5 % (ref 0.0–5.0)
HCT: 41.6 % (ref 36.0–46.0)
Hemoglobin: 13.9 g/dL (ref 12.0–15.0)
Lymphocytes Relative: 23.8 % (ref 12.0–46.0)
Lymphs Abs: 2.8 10*3/uL (ref 0.7–4.0)
MCHC: 33.5 g/dL (ref 30.0–36.0)
MCV: 93.6 fl (ref 78.0–100.0)
Monocytes Absolute: 0.7 10*3/uL (ref 0.1–1.0)
Monocytes Relative: 5.7 % (ref 3.0–12.0)
Neutro Abs: 8 10*3/uL — ABNORMAL HIGH (ref 1.4–7.7)
Neutrophils Relative %: 69.1 % (ref 43.0–77.0)
Platelets: 307 10*3/uL (ref 150.0–400.0)
RBC: 4.45 Mil/uL (ref 3.87–5.11)
RDW: 12.9 % (ref 11.5–15.5)
WBC: 11.7 10*3/uL — ABNORMAL HIGH (ref 4.0–10.5)

## 2020-09-25 LAB — LIPID PANEL
Cholesterol: 177 mg/dL (ref 0–200)
HDL: 68.3 mg/dL (ref >39.00)
LDL Cholesterol: 87 mg/dL (ref 0–99)
NonHDL: 108.69
Total CHOL/HDL Ratio: 3
Triglycerides: 108 mg/dL (ref 0.0–149.0)
VLDL: 21.6 mg/dL (ref 0.0–40.0)

## 2020-09-25 LAB — COMPREHENSIVE METABOLIC PANEL
ALT: 11 U/L (ref 0–35)
AST: 16 U/L (ref 0–37)
Albumin: 4.3 g/dL (ref 3.5–5.2)
Alkaline Phosphatase: 55 U/L (ref 39–117)
BUN: 11 mg/dL (ref 6–23)
CO2: 26 mEq/L (ref 19–32)
Calcium: 9.4 mg/dL (ref 8.4–10.5)
Chloride: 97 mEq/L (ref 96–112)
Creatinine, Ser: 0.6 mg/dL (ref 0.40–1.20)
GFR: 99.94 mL/min (ref >60.00)
Glucose, Bld: 77 mg/dL (ref 70–99)
Potassium: 4.5 mEq/L (ref 3.5–5.1)
Sodium: 132 mEq/L — ABNORMAL LOW (ref 135–145)
Total Bilirubin: 0.4 mg/dL (ref 0.2–1.2)
Total Protein: 6.8 g/dL (ref 6.0–8.3)

## 2020-09-25 LAB — TSH: TSH: 1.01 u[IU]/mL (ref 0.35–4.50)

## 2020-09-25 LAB — VITAMIN D 25 HYDROXY (VIT D DEFICIENCY, FRACTURES): VITD: 26.65 ng/mL — ABNORMAL LOW (ref 30.00–100.00)

## 2020-09-25 MED ORDER — EZETIMIBE 10 MG PO TABS: 10.0000 mg | ORAL_TABLET | Freq: Every day | ORAL | 3 refills | Status: DC

## 2020-09-25 MED ORDER — PAROXETINE HCL 20 MG PO TABS
20.0000 mg | ORAL_TABLET | Freq: Every day | ORAL | 5 refills | Status: DC
Start: 2020-09-25 — End: 2020-12-26

## 2020-09-25 NOTE — Patient Instructions (Addendum)
Paxil 20 mg at night.  Effexor: after 2 weeks on higher dose of paxil, decrease effexor to one tab every other day for 10 days, then one tab every 2 days for 2 doses.   Follow up in 3 months.    Generalized Anxiety Disorder, Adult Generalized anxiety disorder (GAD) is a mental health condition. Unlike normal worries, anxiety related to GAD is not triggered by a specific event. These worries do not fade or get better with time. GAD interferes with relationships,work, and school. GAD symptoms can vary from mild to severe. People with severe GAD can have intense waves of anxiety with physical symptoms that are similar to panicattacks. What are the causes? The exact cause of GAD is not known, but the following are believed to have an impact: Differences in natural brain chemicals. Genes passed down from parents to children. Differences in the way threats are perceived. Development during childhood. Personality. What increases the risk? The following factors may make you more likely to develop this condition: Being female. Having a family history of anxiety disorders. Being very shy. Experiencing very stressful life events, such as the death of a loved one. Having a very stressful family environment. What are the signs or symptoms? People with GAD often worry excessively about many things in their lives, such as their health and family. Symptoms may also include: Mental and emotional symptoms: Worrying excessively about natural disasters. Fear of being late. Difficulty concentrating. Fears that others are judging your performance. Physical symptoms: Fatigue. Headaches, muscle tension, muscle twitches, trembling, or feeling shaky. Feeling like your heart is pounding or beating very fast. Feeling out of breath or like you cannot take a deep breath. Having trouble falling asleep or staying asleep, or experiencing restlessness. Sweating. Nausea, diarrhea, or irritable bowel syndrome  (IBS). Behavioral symptoms: Experiencing erratic moods or irritability. Avoidance of new situations. Avoidance of people. Extreme difficulty making decisions. How is this diagnosed? This condition is diagnosed based on your symptoms and medical history. You will also have a physical exam. Your health care provider may perform tests torule out other possible causes of your symptoms. To be diagnosed with GAD, a person must have anxiety that: Is out of his or her control. Affects several different aspects of his or her life, such as work and relationships. Causes distress that makes him or her unable to take part in normal activities. Includes at least three symptoms of GAD, such as restlessness, fatigue, trouble concentrating, irritability, muscle tension, or sleep problems. Before your health care provider can confirm a diagnosis of GAD, these symptoms must be present more days than they are not, and they must last for 6 months orlonger. How is this treated? This condition may be treated with: Medicine. Antidepressant medicine is usually prescribed for long-term daily control. Anti-anxiety medicines may be added in severe cases, especially when panic attacks occur. Talk therapy (psychotherapy). Certain types of talk therapy can be helpful in treating GAD by providing support, education, and guidance. Options include: Cognitive behavioral therapy (CBT). People learn coping skills and self-calming techniques to ease their physical symptoms. They learn to identify unrealistic thoughts and behaviors and to replace them with more appropriate thoughts and behaviors. Acceptance and commitment therapy (ACT). This treatment teaches people how to be mindful as a way to cope with unwanted thoughts and feelings. Biofeedback. This process trains you to manage your body's response (physiological response) through breathing techniques and relaxation methods. You will work with a therapist while machines are used  to  monitor your physical symptoms. Stress management techniques. These include yoga, meditation, and exercise. A mental health specialist can help determine which treatment is best for you. Some people see improvement with one type of therapy. However, other peoplerequire a combination of therapies. Follow these instructions at home: Lifestyle Maintain a consistent routine and schedule. Anticipate stressful situations. Create a plan, and allow extra time to work with your plan. Practice stress management or self-calming techniques that you have learned from your therapist or your health care provider. General instructions Take over-the-counter and prescription medicines only as told by your health care provider. Understand that you are likely to have setbacks. Accept this and be kind to yourself as you persist to take better care of yourself. Recognize and accept your accomplishments, even if you judge them as small. Keep all follow-up visits as told by your health care provider. This is important. Contact a health care provider if: Your symptoms do not get better. Your symptoms get worse. You have signs of depression, such as: A persistently sad or irritable mood. Loss of enjoyment in activities that used to bring you joy. Change in weight or eating. Changes in sleeping habits. Avoiding friends or family members. Loss of energy for normal tasks. Feelings of guilt or worthlessness. Get help right away if: You have serious thoughts about hurting yourself or others. If you ever feel like you may hurt yourself or others, or have thoughts about taking your own life, get help right away. Go to your nearest emergency department or: Call your local emergency services (911 in the [place] Call a suicide crisis helpline, such as the National Suicide Prevention Lifeline at 332-193-1480. This is open 24 hours a day in the [place] Text the Crisis Text Line at 438-118-3712 (in the [place] Summary Generalized  anxiety disorder (GAD) is a mental health condition that involves worry that is not triggered by a specific event. People with GAD often worry excessively about many things in their lives, such as their health and family. GAD may cause symptoms such as restlessness, trouble concentrating, sleep problems, frequent sweating, nausea, diarrhea, headaches, and trembling or muscle twitching. A mental health specialist can help determine which treatment is best for you. Some people see improvement with one type of therapy. However, other people require a combination of therapies. This information is not intended to replace advice given to you by your health care provider. Make sure you discuss any questions you have with your healthcare provider. Document Revised: 01/10/2019 Document Reviewed: 01/10/2019 Elsevier Patient Education  2022 [employer].

## 2020-09-25 NOTE — Progress Notes (Signed)
This visit occurred during the SARS-CoV-2 public health emergency.  Safety protocols were in place, including screening questions prior to the visit, additional usage of staff PPE, and extensive cleaning of exam room while observing appropriate contact time as indicated for disinfecting solutions.    Michelle Arnold , 1965/06/12, 55 y.o., female MRN: [id number] Patient Care Team    Relationship Specialty Notifications Start End  Ma Hillock, DO PCP - General Family Medicine  11/03/15     Chief Complaint  Patient presents with   Depression    CMC; pt is not fasting     Subjective: Pt presents for an OV to follow-up on her anxiety, hematuria and hyponatremia Hematuria/UTI: Patient recently treated for UTI with Keflex.  UTI was positive for E. coli infection and sensitive to Keflex.  Patient had completed complete treatment but feels that her symptoms have remained.  She is a smoker.  She has had some microscopic hematuria intermittently no gross hematuria.  Depression/anxiety: Over the last few months patient has suffered a great deal of loss with the passing of her son and brother.  She has been established with psychology and is working through the grief process.  Since her last visit patient's son unexpectedly passed away and now her brother also passed away last week. She has a good support system at home. She reports she is is still sleeping well but has a hard time starting tasks and getting motivated.  She states she gets up and does the things that she absolutely has to do but she has noted she has been putting things off until she has no choice but to complete them.  She reports she is getting up and taking an active part in the kids lives now.  She also reports that the shaking has completely resolved since starting the Paxil.  She is tolerating Paxil 10 mg in the evening.  She reports she still is not taking the Ativan and has not needed any doses.  She has continued the Effexor 75  mg daily.  She is wondering if maybe the Effexor just is not working well for her anymore.  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 She reports she feels she is dealing with it and doing ok on the effexor 150 mg QD. She is taking 1 salt tab a day for her hyponatremia. She has not needed to use the ativan.  Pt reports CT lung screen is not covered by her insurance. Prior note:  After last appointment patient was found to have low sodium. Her Effexor dose was decreased to 150 mg daily and patient was encouraged to start supplementing with V8 or Gatorade servings daily to help increase sodium levels. Ativan 0.5 mg twice daily was added to start to help with anxiety. Patient reports she is only used this once since prescribed. Patient reports she feels mildly improved since her last visit. Her husband feels she is still fatigued and unmotivated. He still sees a change from her baseline. Prior note: Patient reports she is "stuck on her thoughts."She feels she gets locked up or freezes and cannot think well.  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  She reports she just froze and cannot think of what to do next.  An example her husband gives is  when they were driving home from the beach in separate cars, she was so fearful she was going to get lost and he was going to "lose her. "He reports he was right in front of her, and she did not see him and missed a turn when he turned.  She recalls a situation of feeling panicked and not knowing what to do.  He has had stressors surrounding their business, financial, she is caring for 2 of her grandchildren and her mother lives in their home.  Her son almost passed away a few months ago, and she found him in time-which saved his life. Has been reports overall he thinks she sleeps well.   She does talk in her sleep on occasions and she does snore.  He reports that her entire motivation and daily routine has changed over the last few months.  She used to get up have a couple coffee, smoke a cigarette and do her devotions and now she will get up and frequently go back to bed for a nap.  She reports she feels overwhelmed and does not want to deal with the day.  She reports food no longer tastes good.  She is an everyday smoker.  She has lost weight.  Her husband states she does not eat much.  Shared decision making visit for lung cancer screening: Patient was brought in today for a office visit concerning shared decision making for their lung cancer screening.Michelle Arnold is a 55 y.o. female Patient is between the ages of 74-80: Yes Patient is a current smoker with at least 30 year pack year history or Patient is a former smoker, quit less than 15 years ago and has a 30 pack year history : Yes Patient has current symptoms: No Patient has a  health problem that substantially limits life expectancy or the ability or willingness to have curative lung surgery: Yes Patient will consider lung cancer screening, at this time she has still declined.  Depression screen [facility] 2/9 09/25/2020 06/02/2020 05/07/2020 01/30/2020 10/30/2019  Decreased Interest 2 1 3  0 3  Down, Depressed, Hopeless 2 1 3  0 3  PHQ - 2 Score 4 2 6  0 6  Altered sleeping 0 1 3 - 3  Tired, decreased energy 0 1 3 - 0  Change in appetite 0 0 3 - 3  Feeling bad or failure about yourself  0 1 3 - 3  Trouble concentrating 0 0 2 - 3  Moving slowly or fidgety/restless 0 0 2 - 0  Suicidal thoughts 0 0 0 - 0  PHQ-9 Score 4 5 22  - 18  Difficult doing work/chores - - - - Extremely dIfficult  Some recent data might be hidden   GAD 7 : Generalized Anxiety Score 09/25/2020 06/02/2020 05/07/2020 10/30/2019  Nervous, Anxious, on Edge 1 1 3 3   Control/stop worrying 1 1 3 3   Worry too much - different things 1 1 3 3   Trouble relaxing 0 1 3 1    Restless 0 1 2 0  Easily annoyed or irritable 0 1 2 0  Afraid - awful might happen 0 1 2 0  Total GAD 7 Score 3 7 18 10   Anxiety Difficulty - - Extremely difficult Extremely difficult    Allergies  Allergen Reactions   Abilify [Aripiprazole] Other (See Comments)    Suicidal thoughts.    Atorvastatin     Memory loss   Statins    Social History   Social History Narrative   Married.  Has children.    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    Everyday smoker. Occasional alcohol. No drugs.    Past Medical History:  Diagnosis Date   Allergy    Chicken pox as a child   CTS (carpal tunnel syndrome)    right   Depression with anxiety    Dermatitis 11/09/2011   Right arm   Fatigue 11/09/2011   Hidradenitis suppurativa 12/16/2011   Hyperlipidemia    Measles as a child   Migraine 11/09/2011   Mumps as a child   Other abnormal Papanicolaou smear of cervix and cervical HPV(795.09) 2004   required LEEP procedure in 04 but no recurrence   Perimenopausal    Polydipsia 01/11/2018   Tobacco abuse 11/09/2011   Vitamin D deficiency    Past Surgical History:  Procedure Laterality Date   LEEP  2004   Family History  Problem Relation Age of Onset   Diabetes Mother        ?   Osteoporosis Mother    Hyperlipidemia Father    Heart disease Father        MI in 46   Hypertension Father    Heart attack Father 54   Cancer Maternal Grandmother        lung- smoker   Osteoporosis Maternal Grandmother    Cancer Maternal Grandfather        prostate   Cataracts Paternal Grandmother    Other Paternal Grandfather        PAD with gangrene   Breast cancer Maternal Aunt    Allergies as of 09/25/2020       Reactions   Abilify [aripiprazole] Other (See Comments)   Suicidal thoughts.    Atorvastatin    Memory loss   Statins         Medication List        Accurate as of September 25, 2020  1:41 PM. If you have any questions, ask your nurse or doctor.          STOP taking these medications     cephALEXin 500 MG capsule Commonly known as: KEFLEX Stopped by: Howard Pouch, DO   venlafaxine XR 150 MG 24 hr capsule Commonly known as: EFFEXOR-XR Stopped by: Howard Pouch, DO       TAKE these medications    alendronate 70 MG tablet Commonly known as: FOSAMAX Take 1 tablet (70 mg total) by mouth every 7 (seven) days. Take with a full glass of water on an empty stomach.   ezetimibe 10 MG tablet Commonly known as: ZETIA Take 1 tablet (10 mg total) by mouth daily.   LORazepam 0.5 MG tablet Commonly known as: ATIVAN Take 1 tablet (0.5 mg total) by mouth 2 (two) times daily as needed for anxiety.   PARoxetine 20 MG tablet Commonly known as: Paxil Take 1 tablet (20 mg total) by mouth daily. What changed:  medication strength how much to take Changed by: Howard Pouch, DO   Vitamin D3 25 MCG (1000 UT) Caps Take 2 capsules by mouth daily.        All past medical history, surgical history, allergies, family history, immunizations andmedications were updated in the EMR today and reviewed under the history and medication portions of their EMR.     ROS: Negative, with the exception of above mentioned in HPI  Objective:  BP 101/64   Pulse 82   Temp 98.3 F (36.8 C) (Oral)   Ht 5' 4"  (1.626 m)   Wt 114 lb (  51.7 kg)   LMP  (LMP Unknown)   SpO2 100%   BMI 19.57 kg/m  Body mass index is 19.57 kg/m. Gen: Afebrile. No acute distress.  Nontoxic, pleasant female. HENT: AT. [place].  Eyes:Pupils Equal Round Reactive to light, Extraocular movements intact,  Conjunctiva without redness, discharge or icterus. Neck/lymp/endocrine: Supple, no lymphadenopathy, no thyromegaly CV: RRR no murmur, no edema Chest: CTAB, no wheeze or crackles Skin: No rashes, purpura or petechiae.  Neuro:  Normal gait. PERLA. EOMi. Alert. Oriented x3 Psych: Normal affect, dress and demeanor. Normal speech. Normal thought content and judgment.  No results found. No results found. No results found  for this or any previous visit (from the past 24 hour(s)).  Assessment/Plan: Michelle Arnold is a 55 y.o. female present for OV for  Depression with anxiety/grieving -She has greatly improved over the last few months.  She would like to try to come off the Effexor as she does not think it is working well enough for her in a longer period Increase Paxil to 20 mg daily/evening After 2 weeks at increased dose of Paxil, decrease Effexor to q. OD x10 days, increase to q02d x2 doses then discontinue Effexor. Continue Ativan twice daily as needed-she has not use this in a very long time. Antonito reviewed 09/25/20  Tried meds: Effexor,  Remeron, Seroquel, Abilify, Wellbutrin Referred to psychology> est w [facility] per pt.  - f/u 3 mos- sooner if needed.  She was encouraged to call in after 4 weeks if she feels she needs a higher dose of Paxil.  We would adjust over the phone and then follow her up closer.  osteoporosis without current pathological fracture Continue fosamax once weekly.  Repeat DEXA due 08/2021 Vitamin D collected today.  Mixed hyperlipidemia Stable Continue zeita CBC, CMP, lipids and TSH collected today.  Hematuria/recent UTI/bladder discomfort: Patient reports continued bladder pressure despite treatment for E. coli UTI with Keflex. Will repeat microscopic urinalysis with reflexive culture today.  Patient will be called with results once received and antibiotic will be started at that time if urinalysis is infectious. If urinalysis is not infectious, would refer her to urology/gynecology for further evaluation.   Reviewed expectations re: course of current medical issues. Discussed self-management of symptoms. Outlined signs and symptoms indicating need for more acute intervention. Patient verbalized understanding and all questions were answered. Patient received an After-Visit Summary.   Orders Placed This Encounter  Procedures   CBC w/Diff   Comp Met  (CMET)   TSH   Vitamin D (25 hydroxy)   Urinalysis w microscopic + reflex cultur   Lipid panel    Meds ordered this encounter  Medications   PARoxetine (PAXIL) 20 MG tablet    Sig: Take 1 tablet (20 mg total) by mouth daily.    Dispense:  30 tablet    Refill:  5    DC prior dose   ezetimibe (ZETIA) 10 MG tablet    Sig: Take 1 tablet (10 mg total) by mouth daily.    Dispense:  90 tablet    Refill:  3    MUST HAVE OV FOR FURTHER REFILLS    Referral Orders  No referral(s) requested today     Note is dictated utilizing voice recognition software. Although note has been proof read prior to signing, occasional typographical errors still can be missed. If any questions arise, please do not hesitate to call for verification.   electronically signed by:  Howard Pouch, DO  [facility] [place]

## 2020-09-26 ENCOUNTER — Telehealth: Payer: Self-pay | Admitting: Family Medicine

## 2020-09-26 MED ORDER — SODIUM CHLORIDE 1 G PO TABS: 1.0000 g | ORAL_TABLET | Freq: Every day | ORAL | 1 refills | Status: DC

## 2020-09-26 MED ORDER — NITROFURANTOIN MONOHYD MACRO 100 MG PO CAPS
100.0000 mg | ORAL_CAPSULE | Freq: Two times a day (BID) | ORAL | 0 refills | Status: DC
Start: 2020-09-26 — End: 2020-12-26

## 2020-09-26 NOTE — Telephone Encounter (Signed)
Spoke with patient regarding results/recommendations.  

## 2020-09-26 NOTE — Telephone Encounter (Signed)
Please call patient -Liver, kidney and thyroid levels are normal. -She still has mildly low sodium at 132.  As long as this is stable and she is not having symptoms we do not have to do anything further.  She could also consider restarting salt tab daily-I have called this in as a prescription.  They are also over-the-counter. -Cholesterol panel is greatly improved and looks wonderful. - Vitamin D is mildly low at 26 I would encourage her to either start or increase her vitamin D supplement by 1000 units daily  He has a very mildly elevated white count and her urinalysis looks like it could potentially still be infectious.  I believe the white count is possibly from the UTI.  There is still a trace of blood in her urine as well.  I have called in Macrobid which is an antibiotic for her to take every 12 hours for 7 days.  We will call her with urine culture results once received.

## 2020-09-27 LAB — CULTURE INDICATED

## 2020-09-27 LAB — URINALYSIS W MICROSCOPIC + REFLEX CULTURE
Bacteria, UA: NONE SEEN /HPF
Bilirubin Urine: NEGATIVE
Glucose, UA: NEGATIVE
Hyaline Cast: NONE SEEN /LPF
Ketones, ur: NEGATIVE
Nitrites, Initial: NEGATIVE
Protein, ur: NEGATIVE
RBC / HPF: NONE SEEN /HPF (ref 0–2)
Specific Gravity, Urine: 1.002 (ref 1.001–1.035)
pH: 6.5 (ref 5.0–8.0)

## 2020-09-27 LAB — URINE CULTURE
MICRO NUMBER:: 12046815
SPECIMEN QUALITY:: ADEQUATE

## 2020-09-29 ENCOUNTER — Telehealth: Payer: Self-pay

## 2020-09-29 DIAGNOSIS — N390 Urinary tract infection, site not specified: Secondary | ICD-10-CM

## 2020-09-29 NOTE — Telephone Encounter (Signed)
-----   Message from Natalia Leatherwood, DO sent at 09/29/2020  7:51 AM EDT ----- Please inform patient: Her urine culture did result with remaining E. coli infection.  The medication prescribed will treat the infection appropriately. Please schedule her for lab appointment only in about 10 days to have urinalysis collected to ensure complete treatment since this was a recurrent UTI. Please place order for urine culture. Thanks

## 2020-09-29 NOTE — Telephone Encounter (Signed)
Spoke with pt regarding labs and instructions.   

## 2020-10-08 ENCOUNTER — Other Ambulatory Visit: Payer: Self-pay

## 2020-10-08 ENCOUNTER — Ambulatory Visit (INDEPENDENT_AMBULATORY_CARE_PROVIDER_SITE_OTHER): Payer: 59

## 2020-10-08 DIAGNOSIS — N390 Urinary tract infection, site not specified: Secondary | ICD-10-CM

## 2020-10-10 LAB — URINALYSIS, ROUTINE W REFLEX MICROSCOPIC
Bacteria, UA: NONE SEEN /HPF
Bilirubin Urine: NEGATIVE
Glucose, UA: NEGATIVE
Hyaline Cast: NONE SEEN /LPF
Ketones, ur: NEGATIVE
Leukocytes,Ua: NEGATIVE
Nitrite: NEGATIVE
Protein, ur: NEGATIVE
Specific Gravity, Urine: 1.003 (ref 1.001–1.035)
WBC, UA: NONE SEEN /HPF (ref 0–5)
pH: 6.5 (ref 5.0–8.0)

## 2020-10-10 LAB — URINE CULTURE
MICRO NUMBER:: 12089925
SPECIMEN QUALITY:: ADEQUATE

## 2020-10-10 LAB — MICROSCOPIC MESSAGE

## 2020-12-23 ENCOUNTER — Other Ambulatory Visit: Payer: Self-pay | Admitting: Family Medicine

## 2020-12-26 ENCOUNTER — Encounter: Payer: Self-pay | Admitting: Family Medicine

## 2020-12-26 ENCOUNTER — Other Ambulatory Visit: Payer: Self-pay

## 2020-12-26 ENCOUNTER — Ambulatory Visit: Payer: 59 | Admitting: Family Medicine

## 2020-12-26 VITALS — BP 92/60 | HR 89 | Temp 98.1°F | Ht 64.0 in | Wt 110.0 lb

## 2020-12-26 DIAGNOSIS — M818 Other osteoporosis without current pathological fracture: Secondary | ICD-10-CM | POA: Diagnosis not present

## 2020-12-26 DIAGNOSIS — F4321 Adjustment disorder with depressed mood: Secondary | ICD-10-CM

## 2020-12-26 DIAGNOSIS — Z634 Disappearance and death of family member: Secondary | ICD-10-CM

## 2020-12-26 DIAGNOSIS — Z720 Tobacco use: Secondary | ICD-10-CM

## 2020-12-26 DIAGNOSIS — F418 Other specified anxiety disorders: Secondary | ICD-10-CM | POA: Diagnosis not present

## 2020-12-26 DIAGNOSIS — E782 Mixed hyperlipidemia: Secondary | ICD-10-CM

## 2020-12-26 DIAGNOSIS — Z23 Encounter for immunization: Secondary | ICD-10-CM | POA: Diagnosis not present

## 2020-12-26 DIAGNOSIS — F17210 Nicotine dependence, cigarettes, uncomplicated: Secondary | ICD-10-CM | POA: Diagnosis not present

## 2020-12-26 MED ORDER — ALENDRONATE SODIUM 70 MG PO TABS: 70.0000 mg | ORAL_TABLET | ORAL | 11 refills | Status: DC

## 2020-12-26 NOTE — Progress Notes (Signed)
This visit occurred during the SARS-CoV-2 public health emergency.  Safety protocols were in place, including screening questions prior to the visit, additional usage of staff PPE, and extensive cleaning of exam room while observing appropriate contact time as indicated for disinfecting solutions.    Michelle Arnold , 06/15/1965, 55 y.o., female MRN: [id number] Patient Care Team    Relationship Specialty Notifications Start End  Natalia Leatherwood, DO PCP - General Family Medicine  11/03/15     Chief Complaint  Patient presents with   Anxiety    CMC; pt is not fasting     Subjective: Pt presents for an OV to follow-up on her [facility] Depression/anxiety: pt reports she tapered off the effexor as directed and did not have any side effects doing so. She then felt she could taper off paxil and did so on her own. She reports she is in a good place right now. She feels well. She has days she feels the anxiety and grief, but feels she is able to cope.  Prior note:  Over the last few months patient has suffered a great deal of loss with the passing of her son and brother.  She has been established with psychology and is working through the grief process.  Since her last visit patient's son unexpectedly passed away and now her brother also passed away last week. She has a good support system at home. She reports she is is still sleeping well but has a hard time starting tasks and getting motivated.  She states she gets up and does the things that she absolutely has to do but she has noted she has been putting things off until she has no choice but to complete them.  She reports she is getting up and taking an active part in the kids lives now.  She also reports that the shaking has completely resolved since starting the Paxil.  She is tolerating Paxil 10 mg in the evening.  She reports she still is not taking the Ativan and has not needed any doses.  She has continued the Effexor 75 mg daily.  She is wondering  if maybe the Effexor just is not working well for her anymore.   Mixed hyperlipidemia Tolerating and compliant with zeita. Lipids responded well to medication. Labs due next visit.   osteoporosis without current pathological fracture Tolerating fosamax and complaint with once weekly dose.    Depression screen [facility] 2/9 12/26/2020 09/25/2020 06/02/2020 05/07/2020 01/30/2020  Decreased Interest 0 2 1 3  0  Down, Depressed, Hopeless 0 2 1 3  0  PHQ - 2 Score 0 4 2 6  0  Altered sleeping 0 0 1 3 -  Tired, decreased energy 0 0 1 3 -  Change in appetite 0 0 0 3 -  Feeling bad or failure about yourself  0 0 1 3 -  Trouble concentrating 0 0 0 2 -  Moving slowly or fidgety/restless 0 0 0 2 -  Suicidal thoughts 0 0 0 0 -  PHQ-9 Score 0 4 5 22  -  Difficult doing work/chores - - - - -  Some recent data might be hidden   GAD 7 : Generalized Anxiety Score 12/26/2020 09/25/2020 06/02/2020 05/07/2020  Nervous, Anxious, on Edge 0 1 1 3   Control/stop worrying 0 1 1 3   Worry too much - different things 0 1 1 3   Trouble relaxing 0 0 1 3  Restless 0 0 1 2  Easily annoyed or irritable  0 0 1 2  Afraid - awful might happen 0 0 1 2  Total GAD 7 Score 0 3 7 18   Anxiety Difficulty - - - Extremely difficult    Allergies  Allergen Reactions   Abilify [Aripiprazole] Other (See Comments)    Suicidal thoughts.    Atorvastatin     Memory loss   Statins    Social History   Social History Narrative   Married. Has children.    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    Everyday smoker. Occasional alcohol. No drugs.    Past Medical History:  Diagnosis Date   Allergy    Chicken pox as a child   CTS (carpal tunnel syndrome)    right   Depression with anxiety    Dermatitis 11/09/2011   Right arm   Fatigue 11/09/2011   Hidradenitis suppurativa 12/16/2011   Hyperlipidemia    Measles as a child   Migraine 11/09/2011   Mumps as a child   Other abnormal Papanicolaou smear of cervix and cervical HPV(795.09) 2004   required LEEP  procedure in 04 but no recurrence   Perimenopausal    Polydipsia 01/11/2018   Tobacco abuse 11/09/2011   Vitamin D deficiency    Past Surgical History:  Procedure Laterality Date   LEEP  2004   Family History  Problem Relation Age of Onset   Diabetes Mother        ?   Osteoporosis Mother    Hyperlipidemia Father    Heart disease Father        MI in 67   Hypertension Father    Heart attack Father 2   Cancer Maternal Grandmother        lung- smoker   Osteoporosis Maternal Grandmother    Cancer Maternal Grandfather        prostate   Cataracts Paternal Grandmother    Other Paternal Grandfather        PAD with gangrene   Breast cancer Maternal Aunt    Allergies as of 12/26/2020       Reactions   Abilify [aripiprazole] Other (See Comments)   Suicidal thoughts.    Atorvastatin    Memory loss   Statins         Medication List        Accurate as of December 26, 2020 11:31 AM. If you have any questions, ask your nurse or doctor.          STOP taking these medications    LORazepam 0.5 MG tablet Commonly known as: ATIVAN Stopped by: December 28, 2020, DO   nitrofurantoin (macrocrystal-monohydrate) 100 MG capsule Commonly known as: Macrobid Stopped by: Felix Pacini, DO   PARoxetine 20 MG tablet Commonly known as: Paxil Stopped by: Felix Pacini, DO   sodium chloride 1 g tablet Stopped by: Felix Pacini, DO       TAKE these medications    alendronate 70 MG tablet Commonly known as: FOSAMAX Take 1 tablet (70 mg total) by mouth every 7 (seven) days. Take with a full glass of water on an empty stomach.   ezetimibe 10 MG tablet Commonly known as: ZETIA Take 1 tablet (10 mg total) by mouth daily.   Vitamin D3 25 MCG (1000 UT) Caps Take 2 capsules by mouth daily.        All past medical history, surgical history, allergies, family history, immunizations andmedications were updated in the EMR today and reviewed under the history and medication portions of  their EMR.  ROS: Negative, with the exception of above mentioned in HPI  Objective:  BP 92/60   Pulse 89   Temp 98.1 F (36.7 C) (Oral)   Ht 5\' 4"  (1.626 m)   Wt 110 lb (49.9 kg)   LMP  (LMP Unknown)   SpO2 100%   BMI 18.88 kg/m  Body mass index is 18.88 kg/m. Gen: Afebrile. No acute distress. Appears well.  HENT: AT. [place]. No cough  Eyes:Pupils Equal Round Reactive to light, Extraocular movements intact,  Conjunctiva without redness, discharge or icterus. Neck/lymp/endocrine: Supple,no lymphadenopathy, no thyromegaly CV: RRR no murmur, no edema Chest: CTAB, no wheeze or crackles Skin: no rashes, purpura or petechiae.  Neuro: Normal gait. PERLA. EOMi. Alert. Oriented x3 Psych: Normal affect, dress and demeanor. Normal speech. Normal thought content and judgment.    No results found. No results found. No results found for this or any previous visit (from the past 24 hour(s)).  Assessment/Plan: Michelle Arnold is a 55 y.o. female present for OV for  Depression with anxiety/grieving Stable. Not taking any meds at this time and coping well.  Paxil worked well for her, if needing to restart a med in the future.  Tried meds: Effexor,  Remeron, Seroquel, Abilify, Wellbutrin Referred to psychology> est w [place] per pt.  F/u 5.5 mos.   osteoporosis without current pathological fracture Continue fosamax once weekly.  Repeat DEXA due 08/2021 Vitamin D utd  Mixed hyperlipidemia Stable.labs due next visit.  Continue zeita  Lung cancer screen/current smoker:  Discussed nad ordered last visit. Pt declined to schedule when called.  Will continue to offer yearly at cpe   Influenza vaccine given today   Return in about 7 months (around 08/03/2021) for CPE (30 min), CMC (30 min).   Reviewed expectations re: course of current medical issues. Discussed self-management of symptoms. Outlined signs and symptoms indicating need for more acute intervention. Patient verbalized  understanding and all questions were answered. Patient received an After-Visit Summary.   Orders Placed This Encounter  Procedures   Flu Vaccine QUAD 6+ mos PF IM (Fluarix Quad PF)     Meds ordered this encounter  Medications   alendronate (FOSAMAX) 70 MG tablet    Sig: Take 1 tablet (70 mg total) by mouth every 7 (seven) days. Take with a full glass of water on an empty stomach.    Dispense:  4 tablet    Refill:  11     Referral Orders  No referral(s) requested today     Note is dictated utilizing voice recognition software. Although note has been proof read prior to signing, occasional typographical errors still can be missed. If any questions arise, please do not hesitate to call for verification.   electronically signed by:  10/03/2021, DO  [facility] [place]

## 2020-12-26 NOTE — Patient Instructions (Signed)
Great to see you today.  I have refilled the medication(s) we provide.   If labs were collected, we will inform you of lab results once received either by echart message or telephone call.   - echart message- for normal results that have been seen by the patient already.   - telephone call: abnormal results or if patient has not viewed results in their echart.   Osteoporosis Osteoporosis happens when the bones become thin and less dense than normal. Osteoporosis makes bones more brittle and fragile and more likely to break (fracture). Over time, osteoporosis can cause your bones to become so weak that they fracture after a minor fall. Bones in the hip, wrist, and spine are most likely to fracture due to osteoporosis. What are the causes? The exact cause of this condition is not known. What increases the risk? You are more likely to develop this condition if you: Have family members with this condition. Have poor nutrition. Use the following: Steroid medicines, such as prednisone. Anti-seizure medicines. Nicotine or tobacco, such as cigarettes, e-cigarettes, and chewing tobacco. Are female. Are age 65 or older. Are not physically active (are sedentary). Are of European or Asian descent. Have a small body frame. What are the signs or symptoms? A fracture might be the first sign of osteoporosis, especially if the fracture results from a fall or injury that usually would not cause a bone to break. Other signs and symptoms include: Pain in the neck or low back. Stooped posture. Loss of height. How is this diagnosed? This condition may be diagnosed based on: Your medical history. A physical exam. A bone mineral density test, also called a DXA or DEXA test (dual-energy X-ray absorptiometry test). This test uses X-rays to measure the amount of minerals in your bones. How is this treated? This condition may be treated by: Making lifestyle changes, such as: Including foods with more  calcium and vitamin D in your diet. Doing weight-bearing and muscle-strengthening exercises. Stopping tobacco use. Limiting alcohol intake. Taking medicine to slow the process of bone loss or to increase bone density. Taking daily supplements of calcium and vitamin D. Taking hormone replacement medicines, such as estrogen for women and testosterone for men. Monitoring your levels of calcium and vitamin D. The goal of treatment is to strengthen your bones and lower your risk for a fracture. Follow these instructions at home: Eating and drinking Include calcium and vitamin D in your diet. Calcium is important for bone health, and vitamin D helps your body absorb calcium. Good sources of calcium and vitamin D include: Certain fatty fish, such as salmon and tuna. Products that have calcium and vitamin D added to them (are fortified), such as fortified cereals. Egg yolks. Cheese. Liver.  Activity Do exercises as told by your health care provider. Ask your health care provider what exercises and activities are safe for you. You should do: Exercises that make you work against gravity (weight-bearing exercises), such as tai chi, yoga, or walking. Exercises to strengthen muscles, such as lifting weights. Lifestyle Do not drink alcohol if: Your health care provider tells you not to drink. You are pregnant, may be pregnant, or are planning to become pregnant. If you drink alcohol: Limit how much you use to: 0-1 drink a day for women. 0-2 drinks a day for men. Know how much alcohol is in your drink. In the [place] one drink equals one 12 oz bottle of beer (355 mL), one 5 oz glass of wine (148 mL), or  one 1 oz glass of hard liquor (44 mL). Do not use any products that contain nicotine or tobacco, such as cigarettes, e-cigarettes, and chewing tobacco. If you need help quitting, ask your health care provider. Preventing falls Use devices to help you move around (mobility aids) as needed, such as  canes, walkers, scooters, or crutches. Keep rooms well-lit and clutter-free. Remove tripping hazards from walkways, including cords and throw rugs. Install grab bars in bathrooms and safety rails on stairs. Use rubber mats in the bathroom and other areas that are often wet or slippery. Wear closed-toe shoes that fit well and support your feet. Wear shoes that have rubber soles or low heels. Review your medicines with your health care provider. Some medicines can cause dizziness or changes in blood pressure, which can increase your risk of falling. General instructions Take over-the-counter and prescription medicines only as told by your health care provider. Keep all follow-up visits. This is important. Contact a health care provider if: You have never been screened for osteoporosis and you are: A woman who is age 4 or older. A man who is age 63 or older. Get help right away if: You fall or injure yourself. Summary Osteoporosis is thinning and loss of density in your bones. This makes bones more brittle and fragile and more likely to break (fracture),even with minor falls. The goal of treatment is to strengthen your bones and lower your risk for a fracture. Include calcium and vitamin D in your diet. Calcium is important for bone health, and vitamin D helps your body absorb calcium. Talk with your health care provider about screening for osteoporosis if you are a woman who is age 74 or older, or a man who is age 36 or older. This information is not intended to replace advice given to you by your health care provider. Make sure you discuss any questions you have with your health care provider. Document Revised: 09/06/2019 Document Reviewed: 09/06/2019 Elsevier Patient Education  [place].

## 2021-04-17 ENCOUNTER — Other Ambulatory Visit: Payer: Self-pay | Admitting: Family Medicine

## 2021-04-30 ENCOUNTER — Telehealth: Payer: Self-pay | Admitting: Family Medicine

## 2021-04-30 NOTE — Telephone Encounter (Signed)
FYI  I scheduled pt for med refill, pt is out of medication  alendronate alendronate (FOSAMAX) 70 MG tablet  Will she be able to get a 30 day supply until her visit?  [facility] [place], [place] - [address] [place] Phone:  [phone]  Fax:  [phone]

## 2021-04-30 NOTE — Telephone Encounter (Signed)
Pt has refills from 12/2020 1 year supply

## 2021-05-01 NOTE — Telephone Encounter (Signed)
Advised pt to contact pharmacy °

## 2021-05-02 ENCOUNTER — Other Ambulatory Visit: Payer: Self-pay | Admitting: Family Medicine

## 2021-05-19 ENCOUNTER — Ambulatory Visit: Payer: Self-pay | Admitting: Family Medicine

## 2021-08-04 ENCOUNTER — Ambulatory Visit (INDEPENDENT_AMBULATORY_CARE_PROVIDER_SITE_OTHER): Payer: 59 | Admitting: Family Medicine

## 2021-08-04 ENCOUNTER — Encounter: Payer: Self-pay | Admitting: Family Medicine

## 2021-08-04 VITALS — BP 103/65 | HR 88 | Temp 97.8°F | Ht 64.0 in | Wt 107.0 lb

## 2021-08-04 DIAGNOSIS — Z23 Encounter for immunization: Secondary | ICD-10-CM | POA: Diagnosis not present

## 2021-08-04 DIAGNOSIS — E782 Mixed hyperlipidemia: Secondary | ICD-10-CM

## 2021-08-04 DIAGNOSIS — Z131 Encounter for screening for diabetes mellitus: Secondary | ICD-10-CM

## 2021-08-04 DIAGNOSIS — Z1231 Encounter for screening mammogram for malignant neoplasm of breast: Secondary | ICD-10-CM

## 2021-08-04 DIAGNOSIS — Z5320 Procedure and treatment not carried out because of patient's decision for unspecified reasons: Secondary | ICD-10-CM | POA: Diagnosis not present

## 2021-08-04 DIAGNOSIS — Z0000 Encounter for general adult medical examination without abnormal findings: Secondary | ICD-10-CM

## 2021-08-04 DIAGNOSIS — E871 Hypo-osmolality and hyponatremia: Secondary | ICD-10-CM

## 2021-08-04 DIAGNOSIS — M818 Other osteoporosis without current pathological fracture: Secondary | ICD-10-CM

## 2021-08-04 DIAGNOSIS — Z888 Allergy status to other drugs, medicaments and biological substances status: Secondary | ICD-10-CM | POA: Diagnosis not present

## 2021-08-04 DIAGNOSIS — F17210 Nicotine dependence, cigarettes, uncomplicated: Secondary | ICD-10-CM

## 2021-08-04 LAB — COMPREHENSIVE METABOLIC PANEL
ALT: 18 U/L (ref 0–35)
AST: 24 U/L (ref 0–37)
Albumin: 4.7 g/dL (ref 3.5–5.2)
Alkaline Phosphatase: 56 U/L (ref 39–117)
BUN: 12 mg/dL (ref 6–23)
CO2: 27 mEq/L (ref 19–32)
Calcium: 9.6 mg/dL (ref 8.4–10.5)
Chloride: 95 mEq/L — ABNORMAL LOW (ref 96–112)
Creatinine, Ser: 0.86 mg/dL (ref 0.40–1.20)
GFR: 74.77 mL/min (ref >60.00)
Glucose, Bld: 83 mg/dL (ref 70–99)
Potassium: 4.1 mEq/L (ref 3.5–5.1)
Sodium: 131 mEq/L — ABNORMAL LOW (ref 135–145)
Total Bilirubin: 0.5 mg/dL (ref 0.2–1.2)
Total Protein: 7.2 g/dL (ref 6.0–8.3)

## 2021-08-04 LAB — CBC
HCT: 39.6 % (ref 36.0–46.0)
Hemoglobin: 13.3 g/dL (ref 12.0–15.0)
MCHC: 33.5 g/dL (ref 30.0–36.0)
MCV: 93 fl (ref 78.0–100.0)
Platelets: 322 10*3/uL (ref 150.0–400.0)
RBC: 4.26 Mil/uL (ref 3.87–5.11)
RDW: 13 % (ref 11.5–15.5)
WBC: 11.9 10*3/uL — ABNORMAL HIGH (ref 4.0–10.5)

## 2021-08-04 LAB — TSH: TSH: 0.77 u[IU]/mL (ref 0.35–5.50)

## 2021-08-04 LAB — LIPID PANEL
Cholesterol: 184 mg/dL (ref 0–200)
HDL: 76.6 mg/dL (ref >39.00)
LDL Cholesterol: 94 mg/dL (ref 0–99)
NonHDL: 107.02
Total CHOL/HDL Ratio: 2
Triglycerides: 67 mg/dL (ref 0.0–149.0)
VLDL: 13.4 mg/dL (ref 0.0–40.0)

## 2021-08-04 LAB — HEMOGLOBIN A1C: Hgb A1c MFr Bld: 5.9 % (ref 4.6–6.5)

## 2021-08-04 LAB — VITAMIN D 25 HYDROXY (VIT D DEFICIENCY, FRACTURES): VITD: 30.49 ng/mL (ref 30.00–100.00)

## 2021-08-04 MED ORDER — ALENDRONATE SODIUM 70 MG PO TABS: 70.0000 mg | ORAL_TABLET | ORAL | 11 refills | Status: DC

## 2021-08-04 MED ORDER — PAROXETINE HCL 20 MG PO TABS: 20.0000 mg | ORAL_TABLET | Freq: Every day | ORAL | 1 refills | Status: DC

## 2021-08-04 MED ORDER — EZETIMIBE 10 MG PO TABS
10.0000 mg | ORAL_TABLET | Freq: Every day | ORAL | 3 refills | Status: DC
Start: 2021-08-04 — End: 2022-08-02

## 2021-08-04 NOTE — Progress Notes (Signed)
? ? ?Patient ID: Michelle Arnold, female  DOB: [birth year], 56 y.o.   MRN: [id number] ?Patient Care Team  ?  Relationship Specialty Notifications Start End  ?Michelle Leatherwood, DO PCP - General Family Medicine  11/03/15   ? ? ?Chief Complaint  ?Patient presents with  ? Annual Exam  ?  Cmc; pt is not fasting  ? ? ?Subjective: ?Michelle Arnold is a 56 y.o.  Female  present for CPE/CMC ?All past medical history, surgical history, allergies, family history, immunizations, medications and social history were updated in the electronic medical record today. ?All recent labs, ED visits and hospitalizations within the last year were reviewed. ? ?Health maintenance:  ?Colonoscopy: completed 11/29/2013, by Dr. Lucretia Arnold, resutls "normal" . follow up  10 years. ?Mammogram: 2021> ordered [facility] ?Cervical cancer screening: last pap: 2020, 5 yr. PCP ?Immunizations: tdap updated today, Influenza UTD 2022(encouraged yearly), PNA 20 updated today (smoking).  Shingrix competed. Covid series completed ?Infectious disease screening: HIV and Hep C screenings completed ?DEXA: 2021> osteoporosis > ordered ?Assistive device: none ?Oxygen PTW:[facility] ?Patient has a Dental home. ?Hospitalizations/ED visits: reviewed ? ?  ?Mixed hyperlipidemia ?Compliance  with zeita. Lipids responded well to medication.  ?  ?osteoporosis without current pathological fracture ?Compliant with  fosamax once weekly dose.  ?  ? ? ?  08/04/2021  ?  9:55 AM 08/04/2021  ?  9:13 AM 12/26/2020  ?  9:48 AM 09/25/2020  ?  9:44 AM 06/02/2020  ? 10:31 AM  ?Depression screen PHQ 2/9  ?Decreased Interest 3 0 0 2 1  ?Down, Depressed, Hopeless 3 0 0 2 1  ?PHQ - 2 Score 6 0 0 4 2  ?Altered sleeping 2  0 0 1  ?Tired, decreased energy 2  0 0 1  ?Change in appetite 0  0 0 0  ?Feeling bad or failure about yourself  0  0 0 1  ?Trouble concentrating 0  0 0 0  ?Moving slowly or fidgety/restless 0  0 0 0  ?Suicidal thoughts 0  0 0 0  ?PHQ-9 Score 10  0 4 5  ? ? ?  08/04/2021  ?  9:55 AM 12/26/2020  ?  9:48 AM  09/25/2020  ?  9:44 AM 06/02/2020  ? 10:31 AM  ?GAD 7 : Generalized Anxiety Score  ?Nervous, Anxious, on Edge 3 0 1 1  ?Control/stop worrying 2 0 1 1  ?Worry too much - different things 2 0 1 1  ?Trouble relaxing 2 0 0 1  ?Restless 0 0 0 1  ?Easily annoyed or irritable 1 0 0 1  ?Afraid - awful might happen 1 0 0 1  ?Total GAD 7 Score 11 0 3 7  ? ?Immunization History  ?Administered Date(s) Administered  ? Influenza Split 02/04/2012  ? Influenza Whole 01/04/2011  ? Influenza,inj,Quad PF,6+ Mos 12/29/2015, 02/02/2017, 01/11/2018, 12/28/2018, 12/20/2019, 12/26/2020  ? PFIZER(Purple Top)SARS-COV-2 Vaccination 11/08/2019, 11/29/2019  ? PNEUMOCOCCAL CONJUGATE-20 08/04/2021  ? Pneumococcal Conjugate-13 06/18/2016  ? Tdap 11/09/2011, 08/04/2021  ? Zoster Recombinat (Shingrix) 10/30/2018, 02/01/2019  ? ?Past Medical History:  ?Diagnosis Date  ? Allergy   ? Chicken pox as a child  ? CTS (carpal tunnel syndrome)   ? right  ? Depression with anxiety   ? Dermatitis 11/09/2011  ? Right arm  ? Fatigue 11/09/2011  ? Hidradenitis suppurativa 12/16/2011  ? Hyperlipidemia   ? Measles as a child  ? Migraine 11/09/2011  ? Mumps as a child  ? Other abnormal Papanicolaou  smear of cervix and cervical HPV(795.09) 2004  ? required LEEP procedure in 04 but no recurrence  ? Perimenopausal   ? Polydipsia 01/11/2018  ? Tobacco abuse 11/09/2011  ? Vitamin D deficiency   ? ?Allergies  ?Allergen Reactions  ? Abilify [Aripiprazole] Other (See Comments)  ?  Suicidal thoughts.   ? Atorvastatin   ?  Memory loss  ? Statins   ? ?Past Surgical History:  ?Procedure Laterality Date  ? LEEP  2004  ? ?Family History  ?Problem Relation Age of Onset  ? Diabetes Mother   ?     ?  ? Osteoporosis Mother   ? Hyperlipidemia Father   ? Heart disease Father   ?     MI in 1994  ? Hypertension Father   ? Heart attack Father [id number]  ? Cancer Maternal Grandmother   ?     lung- smoker  ? Osteoporosis Maternal Grandmother   ? Cancer Maternal Grandfather   ?     prostate  ? Cataracts  Paternal Grandmother   ? Other Paternal Grandfather   ?     PAD with gangrene  ? Breast cancer Maternal Aunt   ? ?Social History  ? ?Social History Narrative  ? Married. Has children.   ? 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   ? Everyday smoker. Occasional alcohol. No drugs.   ? ? ?Allergies as of 08/04/2021   ? ?   Reactions  ? Abilify [aripiprazole] Other (See Comments)  ? Suicidal thoughts.   ? Atorvastatin   ? Memory loss  ? Statins   ? ?  ? ?  ?Medication List  ?  ? ?  ? Accurate as of Aug 04, 2021 12:02 PM. If you have any questions, ask your nurse or doctor.  ?  ?  ? ?  ? ?alendronate 70 MG tablet ?Commonly known as: FOSAMAX ?Take 1 tablet (70 mg total) by mouth every 7 (seven) days. Take with a full glass of water on an empty stomach. ?  ?ezetimibe 10 MG tablet ?Commonly known as: ZETIA ?Take 1 tablet (10 mg total) by mouth daily. ?  ?PARoxetine 20 MG tablet ?Commonly known as: Paxil ?Take 1 tablet (20 mg total) by mouth daily. ?Started by: Michelle Pacinienee Quinlan Mcfall, DO ?  ?Vitamin D3 25 MCG (1000 UT) Caps ?Take 2 capsules by mouth daily. ?  ? ?  ? ? ?All past medical history, surgical history, allergies, family history, immunizations andmedications were updated in the EMR today and reviewed under the history and medication portions of their EMR.    ? ?No results found for this or any previous visit (from the past 2160 hour(s)). ? ?No results found. ? ? ?ROS ?14 pt review of systems performed and negative (unless mentioned in an HPI) ? ?Objective: ?BP 103/65   Pulse 88   Temp 97.8 ?F (36.6 ?C) (Oral)   Ht 5\' 4"  (1.626 m)   Wt 107 lb (48.5 kg)   LMP  (LMP Unknown)   SpO2 100%   BMI 18.37 kg/m?  ?Physical Exam ?Vitals and nursing note reviewed.  ?Constitutional:   ?   General: She is not in acute distress. ?   Appearance: Normal appearance. She is not ill-appearing, toxic-appearing or diaphoretic.  ?HENT:  ?   Head: Normocephalic and atraumatic.  ?   Right Ear: Tympanic membrane, ear canal and external ear normal. There is no  impacted cerumen.  ?   Left Ear: Tympanic membrane, ear canal and external ear  normal. There is no impacted cerumen.  ?   Nose: No congestion or rhinorrhea.  ?   Mouth/Throat:  ?   Mouth: Mucous membranes are moist.  ?   Pharynx: Oropharynx is clear. No oropharyngeal exudate or posterior oropharyngeal erythema.  ?Eyes:  ?   General: No scleral icterus.    ?   Right eye: No discharge.     ?   Left eye: No discharge.  ?   Extraocular Movements: Extraocular movements intact.  ?   Conjunctiva/sclera: Conjunctivae normal.  ?   Pupils: Pupils are equal, round, and reactive to light.  ?Cardiovascular:  ?   Rate and Rhythm: Normal rate and regular rhythm.  ?   Pulses: Normal pulses.  ?   Heart sounds: Normal heart sounds. No murmur heard. ?  No friction rub. No gallop.  ?Pulmonary:  ?   Effort: Pulmonary effort is normal. No respiratory distress.  ?   Breath sounds: Normal breath sounds. No stridor. No wheezing, rhonchi or rales.  ?Chest:  ?   Chest wall: No tenderness.  ?Abdominal:  ?   General: Abdomen is flat. Bowel sounds are normal. There is no distension.  ?   Palpations: Abdomen is soft. There is no mass.  ?   Tenderness: There is no abdominal tenderness. There is no right CVA tenderness, left CVA tenderness, guarding or rebound.  ?   Hernia: No hernia is present.  ?Musculoskeletal:     ?   General: No swelling, tenderness or deformity. Normal range of motion.  ?   Cervical back: Normal range of motion and neck supple. No rigidity or tenderness.  ?   Right lower leg: No edema.  ?   Left lower leg: No edema.  ?Lymphadenopathy:  ?   Cervical: No cervical adenopathy.  ?Skin: ?   General: Skin is warm and dry.  ?   Coloration: Skin is not jaundiced or pale.  ?   Findings: No bruising, erythema, lesion or rash.  ?Neurological:  ?   General: No focal deficit present.  ?   Mental Status: She is alert and oriented to person, place, and time. Mental status is at baseline.  ?   Cranial Nerves: No cranial nerve deficit.  ?    Sensory: No sensory deficit.  ?   Motor: No weakness.  ?   Coordination: Coordination normal.  ?   Gait: Gait normal.  ?   Deep Tendon Reflexes: Reflexes normal.  ?Psychiatric:     ?   Mood and Affect: Mood normal.     ?   B

## 2021-08-04 NOTE — Patient Instructions (Signed)
No follow-ups on file.        Great to see you today.  I have refilled the medication(s) we provide.   If labs were collected, we will inform you of lab results once received either by echart message or telephone call.   - echart message- for normal results that have been seen by the patient already.   - telephone call: abnormal results or if patient has not viewed results in their echart.  Health Maintenance, Female Adopting a healthy lifestyle and getting preventive care are important in promoting health and wellness. Ask your health care provider about: The right schedule for you to have regular tests and exams. Things you can do on your own to prevent diseases and keep yourself healthy. What should I know about diet, weight, and exercise? Eat a healthy diet  Eat a diet that includes plenty of vegetables, fruits, low-fat dairy products, and lean protein. Do not eat a lot of foods that are high in solid fats, added sugars, or sodium. Maintain a healthy weight Body mass index (BMI) is used to identify weight problems. It estimates body fat based on height and weight. Your health care provider can help determine your BMI and help you achieve or maintain a healthy weight. Get regular exercise Get regular exercise. This is one of the most important things you can do for your health. Most adults should: Exercise for at least 150 minutes each week. The exercise should increase your heart rate and make you sweat (moderate-intensity exercise). Do strengthening exercises at least twice a week. This is in addition to the moderate-intensity exercise. Spend less time sitting. Even light physical activity can be beneficial. Watch cholesterol and blood lipids Have your blood tested for lipids and cholesterol at 58 years of age, then have this test every 5 years. Have your cholesterol levels checked more often if: Your lipid or cholesterol levels are high. You are older than 58 years of  age. You are at high risk for heart disease. What should I know about cancer screening? Depending on your health history and family history, you may need to have cancer screening at various ages. This may include screening for: Breast cancer. Cervical cancer. Colorectal cancer. Skin cancer. Lung cancer. What should I know about heart disease, diabetes, and high blood pressure? Blood pressure and heart disease High blood pressure causes heart disease and increases the risk of stroke. This is more likely to develop in people who have high blood pressure readings or are overweight. Have your blood pressure checked: Every 3-5 years if you are 18-39 years of age. Every year if you are 40 years old or older. Diabetes Have regular diabetes screenings. This checks your fasting blood sugar level. Have the screening done: Once every three years after age 40 if you are at a normal weight and have a low risk for diabetes. More often and at a younger age if you are overweight or have a high risk for diabetes. What should I know about preventing infection? Hepatitis B If you have a higher risk for hepatitis B, you should be screened for this virus. Talk with your health care provider to find out if you are at risk for hepatitis B infection. Hepatitis C Testing is recommended for: Everyone born from 1945 through 1965. Anyone with known risk factors for hepatitis C. Sexually transmitted infections (STIs) Get screened for STIs, including gonorrhea and chlamydia, if: You are sexually active and are younger than 58 years of age. You are   older than 58 years of age and your health care provider tells you that you are at risk for this type of infection. Your sexual activity has changed since you were last screened, and you are at increased risk for chlamydia or gonorrhea. Ask your health care provider if you are at risk. Ask your health care provider about whether you are at high risk for HIV. Your health  care provider may recommend a prescription medicine to help prevent HIV infection. If you choose to take medicine to prevent HIV, you should first get tested for HIV. You should then be tested every 3 months for as long as you are taking the medicine. Pregnancy If you are about to stop having your period (premenopausal) and you may become pregnant, seek counseling before you get pregnant. Take 400 to 800 micrograms (mcg) of folic acid every day if you become pregnant. Ask for birth control (contraception) if you want to prevent pregnancy. Osteoporosis and menopause Osteoporosis is a disease in which the bones lose minerals and strength with aging. This can result in bone fractures. If you are 65 years old or older, or if you are at risk for osteoporosis and fractures, ask your health care provider if you should: Be screened for bone loss. Take a calcium or vitamin D supplement to lower your risk of fractures. Be given hormone replacement therapy (HRT) to treat symptoms of menopause. Follow these instructions at home: Alcohol use Do not drink alcohol if: Your health care provider tells you not to drink. You are pregnant, may be pregnant, or are planning to become pregnant. If you drink alcohol: Limit how much you have to: 0-1 drink a day. Know how much alcohol is in your drink. In the [place] one drink equals one 12 oz bottle of beer (355 mL), one 5 oz glass of wine (148 mL), or one 1 oz glass of hard liquor (44 mL). Lifestyle Do not use any products that contain nicotine or tobacco. These products include cigarettes, chewing tobacco, and vaping devices, such as e-cigarettes. If you need help quitting, ask your health care provider. Do not use street drugs. Do not share needles. Ask your health care provider for help if you need support or information about quitting drugs. General instructions Schedule regular health, dental, and eye exams. Stay current with your vaccines. Tell your health  care provider if: You often feel depressed. You have ever been abused or do not feel safe at home. Summary Adopting a healthy lifestyle and getting preventive care are important in promoting health and wellness. Follow your health care provider's instructions about healthy diet, exercising, and getting tested or screened for diseases. Follow your health care provider's instructions on monitoring your cholesterol and blood pressure. This information is not intended to replace advice given to you by your health care provider. Make sure you discuss any questions you have with your health care provider. Document Revised: 08/11/2020 Document Reviewed: 08/11/2020 Elsevier Patient Education  2023 [employer]  

## 2022-01-06 ENCOUNTER — Other Ambulatory Visit: Payer: Self-pay | Admitting: Family Medicine

## 2022-04-06 ENCOUNTER — Telehealth: Payer: Self-pay | Admitting: Family Medicine

## 2022-04-06 NOTE — Telephone Encounter (Signed)
Patient is out of meds - if we cannot refill meds today; please let patient know we will not be able to approve until Dr. Raoul Pitch returns to office. Patient is aware.

## 2022-04-06 NOTE — Telephone Encounter (Signed)
Pt overdue for appt

## 2022-04-06 NOTE — Telephone Encounter (Signed)
Patient scheduled appt for 1/10 with Dr. Raoul Pitch.  Can we call enough meds to get her thru until 1/10 appt?  She is out of meds.  Please call patient [phone]

## 2022-04-07 ENCOUNTER — Other Ambulatory Visit: Payer: Self-pay | Admitting: Family Medicine

## 2022-04-07 ENCOUNTER — Other Ambulatory Visit: Payer: Self-pay

## 2022-04-07 MED ORDER — PAROXETINE HCL 20 MG PO TABS: 20.0000 mg | ORAL_TABLET | Freq: Every day | ORAL | 0 refills | Status: DC

## 2022-04-07 NOTE — Telephone Encounter (Signed)
Spoke with patient regarding results/recommendations.  

## 2022-04-07 NOTE — Telephone Encounter (Signed)
Pt would like a call in regards to her medication request for Paxil. I advised her that per policy it can take up to 72 hours to respond. The patient is currently out of medications at this point.

## 2022-04-14 ENCOUNTER — Ambulatory Visit (INDEPENDENT_AMBULATORY_CARE_PROVIDER_SITE_OTHER): Payer: 59 | Admitting: Family Medicine

## 2022-04-14 ENCOUNTER — Encounter: Payer: Self-pay | Admitting: Family Medicine

## 2022-04-14 VITALS — BP 103/65 | HR 75 | Temp 98.3°F | Ht 64.0 in | Wt 114.0 lb

## 2022-04-14 DIAGNOSIS — E782 Mixed hyperlipidemia: Secondary | ICD-10-CM

## 2022-04-14 DIAGNOSIS — R69 Illness, unspecified: Secondary | ICD-10-CM | POA: Diagnosis not present

## 2022-04-14 DIAGNOSIS — F418 Other specified anxiety disorders: Secondary | ICD-10-CM | POA: Diagnosis not present

## 2022-04-14 DIAGNOSIS — M818 Other osteoporosis without current pathological fracture: Secondary | ICD-10-CM | POA: Diagnosis not present

## 2022-04-14 MED ORDER — PAROXETINE HCL 20 MG PO TABS: 20.0000 mg | ORAL_TABLET | Freq: Every day | ORAL | 1 refills | Status: DC

## 2022-04-14 NOTE — Patient Instructions (Addendum)
Return in about 5 months (around 09/06/2022) for cpe (20 min), Routine chronic condition follow-up.  [facility]: General Contact [phone]      Michelle Arnold to see you today.  I have refilled the medication(s) we provide.   If labs were collected, we will inform you of lab results once received either by echart message or telephone call.   - echart message- for normal results that have been seen by the patient already.   - telephone call: abnormal results or if patient has not viewed results in their echart.

## 2022-04-14 NOTE — Progress Notes (Signed)
Patient ID: Michelle Arnold, female  DOB: Aug 09, 1965, 57 y.o.   MRN: [id number] Patient Care Team    Relationship Specialty Notifications Start End  Natalia Leatherwood, DO PCP - General Family Medicine  11/03/15     Chief Complaint  Patient presents with   Depression    Cmc; pt is fasting     Subjective: Michelle Arnold is a 57 y.o.  Female  present for [facility] All past medical history, surgical history, allergies, family history, immunizations, medications and social history were updated in the electronic medical record today. All recent labs, ED visits and hospitalizations within the last year were reviewed.  Depression/anxiety: Last visit patient stated that she was struggling to motivate herself and she was feeling more anxious.  She elected to restart anxiety coverage with Paxil 20 mg.  Today she states she reports she is feeling better overall. Still grieving the loss of her son, but better. She wonders if this is just her new normal.   Mixed hyperlipidemia Compliant with zeita.    osteoporosis without current pathological fracture Compliant with  fosamax once weekly dose.        04/14/2022    9:43 AM 08/04/2021    9:55 AM 08/04/2021    9:13 AM 12/26/2020    9:48 AM 09/25/2020    9:44 AM  Depression screen PHQ 2/9  Decreased Interest 2 3 0 0 2  Down, Depressed, Hopeless 2 3 0 0 2  PHQ - 2 Score 4 6 0 0 4  Altered sleeping 2 2  0 0  Tired, decreased energy 2 2  0 0  Change in appetite 0 0  0 0  Feeling bad or failure about yourself  2 0  0 0  Trouble concentrating 3 0  0 0  Moving slowly or fidgety/restless 3 0  0 0  Suicidal thoughts 0 0  0 0  PHQ-9 Score 16 10  0 4      08/04/2021    9:55 AM 12/26/2020    9:48 AM 09/25/2020    9:44 AM 06/02/2020   10:31 AM  GAD 7 : Generalized Anxiety Score  Nervous, Anxious, on Edge 3 0 1 1  Control/stop worrying 2 0 1 1  Worry too much - different things 2 0 1 1  Trouble relaxing 2 0 0 1  Restless 0 0 0 1  Easily annoyed or irritable 1 0  0 1  Afraid - awful might happen 1 0 0 1  Total GAD 7 Score 11 0 3 7   Immunization History  Administered Date(s) Administered   Influenza Split 02/04/2012   Influenza Whole 01/04/2011   Influenza,inj,Quad PF,6+ Mos 12/29/2015, 02/02/2017, 01/11/2018, 12/28/2018, 12/20/2019, 12/26/2020   PFIZER(Purple Top)SARS-COV-2 Vaccination 11/08/2019, 11/29/2019   PNEUMOCOCCAL CONJUGATE-20 08/04/2021   Pneumococcal Conjugate-13 06/18/2016   Tdap 11/09/2011, 08/04/2021   Zoster Recombinat (Shingrix) 10/30/2018, 02/01/2019   Past Medical History:  Diagnosis Date   Allergy    Chicken pox as a child   CTS (carpal tunnel syndrome)    right   Depression with anxiety    Dermatitis 11/09/2011   Right arm   Fatigue 11/09/2011   Hidradenitis suppurativa 12/16/2011   Hyperlipidemia    Measles as a child   Migraine 11/09/2011   Mumps as a child   Other abnormal Papanicolaou smear of cervix and cervical HPV(795.09) 2004   required LEEP procedure in 04 but no recurrence   Perimenopausal    Polydipsia 01/11/2018  Tobacco abuse 11/09/2011   Vitamin D deficiency    Allergies  Allergen Reactions   Abilify [Aripiprazole] Other (See Comments)    Suicidal thoughts.    Atorvastatin     Memory loss   Statins    Past Surgical History:  Procedure Laterality Date   LEEP  2004   Family History  Problem Relation Age of Onset   Diabetes Mother        ?   Osteoporosis Mother    Hyperlipidemia Father    Heart disease Father        MI in 63   Hypertension Father    Heart attack Father 36   Cancer Maternal Grandmother        lung- smoker   Osteoporosis Maternal Grandmother    Cancer Maternal Grandfather        prostate   Cataracts Paternal Grandmother    Other Paternal Grandfather        PAD with gangrene   Breast cancer Maternal Aunt    Social History   Social History Narrative   Married. Has children.    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    Everyday smoker. Occasional alcohol. No drugs.      Allergies as of 04/14/2022       Reactions   Abilify [aripiprazole] Other (See Comments)   Suicidal thoughts.    Atorvastatin    Memory loss   Statins         Medication List        Accurate as of April 14, 2022 10:10 AM. If you have any questions, ask your nurse or doctor.          alendronate 70 MG tablet Commonly known as: FOSAMAX Take 1 tablet (70 mg total) by mouth every 7 (seven) days. Take with a full glass of water on an empty stomach.   ezetimibe 10 MG tablet Commonly known as: ZETIA Take 1 tablet (10 mg total) by mouth daily.   PARoxetine 20 MG tablet Commonly known as: Paxil Take 1 tablet (20 mg total) by mouth daily.   Vitamin D3 25 MCG (1000 UT) Caps Take 2 capsules by mouth daily.        All past medical history, surgical history, allergies, family history, immunizations andmedications were updated in the EMR today and reviewed under the history and medication portions of their EMR.     No results found for this or any previous visit (from the past 2160 hour(s)).  No results found.   ROS 14 pt review of systems performed and negative (unless mentioned in an HPI)  Objective: BP 103/65   Pulse 75   Temp 98.3 F (36.8 C) (Oral)   Ht 5\' 4"  (1.626 m)   Wt 114 lb (51.7 kg)   LMP  (LMP Unknown)   SpO2 99%   BMI 19.57 kg/m  Physical Exam Vitals and nursing note reviewed.  Constitutional:      General: She is not in acute distress.    Appearance: Normal appearance. She is not ill-appearing, toxic-appearing or diaphoretic.  HENT:     Head: Normocephalic and atraumatic.  Eyes:     General: No scleral icterus.       Right eye: No discharge.        Left eye: No discharge.     Extraocular Movements: Extraocular movements intact.     Conjunctiva/sclera: Conjunctivae normal.     Pupils: Pupils are equal, round, and reactive to light.  Cardiovascular:  Rate and Rhythm: Normal rate and regular rhythm.     Heart sounds: No murmur  heard. Pulmonary:     Effort: Pulmonary effort is normal. No respiratory distress.     Breath sounds: Normal breath sounds. No wheezing, rhonchi or rales.  Skin:    General: Skin is warm.  Neurological:     Mental Status: She is alert and oriented to person, place, and time. Mental status is at baseline.     Motor: No weakness.     Gait: Gait normal.  Psychiatric:        Mood and Affect: Mood normal.        Behavior: Behavior normal.        Thought Content: Thought content normal.        Judgment: Judgment normal.      No results found.  Assessment/plan: Michelle Arnold is a 57 y.o. female present for [facility] osteoporosis without current pathological fracture Continue fosamax once weekly.  Repeat DEXA due 08/2021> ordered, not yet completed. Vitamin D UTD-due next visit   Mixed hyperlipidemia/Allergy to statin medication Stable Continue zeita Labs due next visit   Depression/anxiety: Stable Continue Paxil 20 mg  We discussed therapy or group counseling today. She is agreeable, but would need a low cost or free service.  Pt was provided with [facility] contact info and also encourage to look for local grieving parent support group. She appreciated the information   Return in about 5 months (around 09/06/2022) for cpe (20 min), Routine chronic condition follow-up.   No orders of the defined types were placed in this encounter.  Meds ordered this encounter  Medications   PARoxetine (PAXIL) 20 MG tablet    Sig: Take 1 tablet (20 mg total) by mouth daily.    Dispense:  90 tablet    Refill:  1   Referral Orders  No referral(s) requested today     Electronically signed by: Howard Pouch, [place]

## 2022-07-06 ENCOUNTER — Other Ambulatory Visit: Payer: Self-pay | Admitting: Family Medicine

## 2022-07-21 DIAGNOSIS — R55 Syncope and collapse: Secondary | ICD-10-CM | POA: Diagnosis not present

## 2022-07-21 DIAGNOSIS — S0083XA Contusion of other part of head, initial encounter: Secondary | ICD-10-CM | POA: Diagnosis not present

## 2022-07-21 DIAGNOSIS — F17210 Nicotine dependence, cigarettes, uncomplicated: Secondary | ICD-10-CM | POA: Diagnosis not present

## 2022-07-21 DIAGNOSIS — Z79899 Other long term (current) drug therapy: Secondary | ICD-10-CM | POA: Diagnosis not present

## 2022-07-21 DIAGNOSIS — R001 Bradycardia, unspecified: Secondary | ICD-10-CM | POA: Diagnosis not present

## 2022-07-21 DIAGNOSIS — Z888 Allergy status to other drugs, medicaments and biological substances status: Secondary | ICD-10-CM | POA: Diagnosis not present

## 2022-07-22 ENCOUNTER — Emergency Department (HOSPITAL_BASED_OUTPATIENT_CLINIC_OR_DEPARTMENT_OTHER): Payer: 59

## 2022-07-22 ENCOUNTER — Telehealth: Payer: Self-pay

## 2022-07-22 ENCOUNTER — Other Ambulatory Visit: Payer: Self-pay

## 2022-07-22 ENCOUNTER — Observation Stay (HOSPITAL_BASED_OUTPATIENT_CLINIC_OR_DEPARTMENT_OTHER)
Admission: EM | Admit: 2022-07-22 | Discharge: 2022-07-24 | Disposition: A | Discharge status: Home / Self Care | Payer: 59 | Attending: General Surgery | Admitting: General Surgery

## 2022-07-22 ENCOUNTER — Encounter (HOSPITAL_BASED_OUTPATIENT_CLINIC_OR_DEPARTMENT_OTHER): Payer: Self-pay | Admitting: Urology

## 2022-07-22 DIAGNOSIS — K3580 Unspecified acute appendicitis: Secondary | ICD-10-CM | POA: Diagnosis present

## 2022-07-22 DIAGNOSIS — K760 Fatty (change of) liver, not elsewhere classified: Secondary | ICD-10-CM | POA: Diagnosis not present

## 2022-07-22 DIAGNOSIS — F17210 Nicotine dependence, cigarettes, uncomplicated: Secondary | ICD-10-CM | POA: Insufficient documentation

## 2022-07-22 DIAGNOSIS — R109 Unspecified abdominal pain: Secondary | ICD-10-CM | POA: Diagnosis not present

## 2022-07-22 DIAGNOSIS — S0083XA Contusion of other part of head, initial encounter: Secondary | ICD-10-CM | POA: Diagnosis not present

## 2022-07-22 DIAGNOSIS — Z888 Allergy status to other drugs, medicaments and biological substances status: Secondary | ICD-10-CM | POA: Diagnosis not present

## 2022-07-22 DIAGNOSIS — Z79899 Other long term (current) drug therapy: Secondary | ICD-10-CM | POA: Insufficient documentation

## 2022-07-22 DIAGNOSIS — N9489 Other specified conditions associated with female genital organs and menstrual cycle: Secondary | ICD-10-CM

## 2022-07-22 DIAGNOSIS — R001 Bradycardia, unspecified: Secondary | ICD-10-CM | POA: Diagnosis not present

## 2022-07-22 DIAGNOSIS — R55 Syncope and collapse: Secondary | ICD-10-CM | POA: Diagnosis not present

## 2022-07-22 DIAGNOSIS — K353 Acute appendicitis with localized peritonitis, without perforation or gangrene: Principal | ICD-10-CM | POA: Insufficient documentation

## 2022-07-22 DIAGNOSIS — R1031 Right lower quadrant pain: Secondary | ICD-10-CM | POA: Diagnosis not present

## 2022-07-22 HISTORY — DX: Unspecified acute appendicitis: K35.80

## 2022-07-22 LAB — URINALYSIS, MICROSCOPIC (REFLEX)

## 2022-07-22 LAB — COMPREHENSIVE METABOLIC PANEL
ALT: 18 U/L (ref 0–44)
AST: 23 U/L (ref 15–41)
Albumin: 3.8 g/dL (ref 3.5–5.0)
Alkaline Phosphatase: 54 U/L (ref 38–126)
Anion gap: 9 (ref 5–15)
BUN: 7 mg/dL (ref 6–20)
CO2: 23 mmol/L (ref 22–32)
Calcium: 8.8 mg/dL — ABNORMAL LOW (ref 8.9–10.3)
Chloride: 96 mmol/L — ABNORMAL LOW (ref 98–111)
Creatinine, Ser: 0.55 mg/dL (ref 0.44–1.00)
GFR, Estimated: >60 mL/min (ref >60)
Glucose, Bld: 127 mg/dL — ABNORMAL HIGH (ref 70–99)
Potassium: 3.6 mmol/L (ref 3.5–5.1)
Sodium: 128 mmol/L — ABNORMAL LOW (ref 135–145)
Total Bilirubin: 1 mg/dL (ref 0.3–1.2)
Total Protein: 7.2 g/dL (ref 6.5–8.1)

## 2022-07-22 LAB — CBC
HCT: 36.1 % (ref 36.0–46.0)
Hemoglobin: 12.4 g/dL (ref 12.0–15.0)
MCH: 30.8 pg (ref 26.0–34.0)
MCHC: 34.3 g/dL (ref 30.0–36.0)
MCV: 89.6 fL (ref 80.0–100.0)
Platelets: 277 10*3/uL (ref 150–400)
RBC: 4.03 MIL/uL (ref 3.87–5.11)
RDW: 12.4 % (ref 11.5–15.5)
WBC: 16.6 10*3/uL — ABNORMAL HIGH (ref 4.0–10.5)
nRBC: 0 % (ref 0.0–0.2)

## 2022-07-22 LAB — URINALYSIS, ROUTINE W REFLEX MICROSCOPIC
Bilirubin Urine: NEGATIVE
Glucose, UA: NEGATIVE mg/dL
Ketones, ur: NEGATIVE mg/dL
Nitrite: NEGATIVE
Protein, ur: NEGATIVE mg/dL
Specific Gravity, Urine: 1.01 (ref 1.005–1.030)
pH: 6.5 (ref 5.0–8.0)

## 2022-07-22 LAB — LIPASE, BLOOD: Lipase: 44 U/L (ref 11–51)

## 2022-07-22 MED ORDER — ONDANSETRON HCL 4 MG/2ML IJ SOLN
4.0000 mg | Freq: Once | INTRAMUSCULAR | Status: AC
  Administered 2022-07-22: 4 mg via INTRAVENOUS
  Filled 2022-07-22: qty 2

## 2022-07-22 MED ORDER — KETOROLAC TROMETHAMINE 15 MG/ML IJ SOLN
15.0000 mg | Freq: Once | INTRAMUSCULAR | Status: AC
  Administered 2022-07-22: 15 mg via INTRAVENOUS
  Filled 2022-07-22: qty 1

## 2022-07-22 MED ORDER — PIPERACILLIN-TAZOBACTAM 3.375 G IVPB
3.3750 g | Freq: Four times a day (QID) | INTRAVENOUS | Status: DC
  Administered 2022-07-22: 3.375 g via INTRAVENOUS
  Filled 2022-07-22: qty 50

## 2022-07-22 MED ORDER — IOHEXOL 350 MG/ML SOLN
100.0000 mL | Freq: Once | INTRAVENOUS | Status: AC | PRN
  Administered 2022-07-22: 100 mL via INTRAVENOUS

## 2022-07-22 MED ORDER — SODIUM CHLORIDE 0.9 % IV BOLUS
1000.0000 mL | Freq: Once | INTRAVENOUS | Status: AC
  Administered 2022-07-22: 1000 mL via INTRAVENOUS

## 2022-07-22 MED ORDER — FENTANYL CITRATE PF 50 MCG/ML IJ SOSY
50.0000 ug | PREFILLED_SYRINGE | Freq: Once | INTRAMUSCULAR | Status: AC
  Administered 2022-07-22: 50 ug via INTRAVENOUS
  Filled 2022-07-22: qty 1

## 2022-07-22 NOTE — Telephone Encounter (Signed)
Should be fine as long as she has taken these in the past without any problem.  I do not know the surrounding of her condition in which she was seen in the ED as of yet, therefore advice is given with caution

## 2022-07-22 NOTE — ED Triage Notes (Addendum)
Pt states was seen at [facility] ER yesterday after having syncope episode, hit right side of head  States had another near syncopal episode  States RLQ pain and chills denies fever  Reports nausea today but denies vomiting

## 2022-07-22 NOTE — ED Notes (Signed)
Pt expressed "sudden, excruciating pain" upon moving in bed; EDP notified, appropriate orders to be placed

## 2022-07-22 NOTE — Telephone Encounter (Signed)
Patient mother<Michelle Arnold calling to see if she is able to give patient tylenol/motrin and Gas X for stomach pain.  Patient was seen at [facility] ED yesterday for fall, passed out for a few seconds and hit her head.  She scheduled an appt with Dr. Claiborne Billings tomorrow 4/19 for ED follow up.  Please call [phone]

## 2022-07-22 NOTE — ED Provider Notes (Signed)
[place] [facility] Provider Note   CSN: [id number] Arrival date & time: 07/22/22  1623     History  Chief Complaint  Patient presents with   Near Syncope   Abdominal Pain    Michelle Arnold is a 57 y.o. female, who presents to the ED secondary to a syncopal episode that occurred yesterday, and now worsening right lower quadrant pain.  She states that she had a syncopal episode, yesterday was just not feeling right, and then passed out and was witnessed on the ring door camera.  She was out for about a minute, and her mother encouraged her to go to the hospital but she refused.  After few hours, she started having some abdominal pain that was on the bilateral lower quadrants, and decided to go to the ER.  They did a CT scan, and it was negative, and chest x-ray was negative.  She was encouraged to follow-up with her primary care doctor for possible cardiac etiology.  She states that they did not check out her abdominal pain, and abdominal pain has been getting worse, and now she is feels diaphoretic, and sick to her stomach.  Last ate around 9:00 today.  She states that she just feels very warm and unwell and weak.  Has been drinking lots of fluids, just feels gray.  States the pain is now in the right lower quadrant, and it is severe, and she feels like she may pass out again.    Home Medications Prior to Admission medications   Medication Sig Start Date End Date Taking? Authorizing Provider  alendronate (FOSAMAX) 70 MG tablet Take 1 tablet (70 mg total) by mouth every 7 (seven) days. Take with a full glass of water on an empty stomach. 08/04/21   Kuneff, Renee A, DO  Cholecalciferol (VITAMIN D3) 1000 units CAPS Take 2 capsules by mouth daily.    [provider]  ezetimibe (ZETIA) 10 MG tablet Take 1 tablet (10 mg total) by mouth daily. 08/04/21   Kuneff, Renee A, DO  PARoxetine (PAXIL) 20 MG tablet Take 1 tablet (20 mg total) by mouth daily. 04/14/22    Kuneff, Renee A, DO      Allergies    Abilify [aripiprazole], Atorvastatin, and Statins    Review of Systems   Review of Systems  Gastrointestinal:  Positive for abdominal pain.    Physical Exam Updated Vital Signs BP (!) 111/58   Pulse 79   Temp 98.7 F (37.1 C)   Resp 16   Ht  (1.626 m)   Wt 51.7 kg   LMP  (LMP Unknown)   SpO2 100%   BMI 19.56 kg/m  Physical Exam Vitals and nursing note reviewed.  Constitutional:      Appearance: She is well-developed. She is ill-appearing and diaphoretic.  HENT:     Head: Normocephalic and atraumatic.  Eyes:     Conjunctiva/sclera: Conjunctivae normal.  Cardiovascular:     Rate and Rhythm: Normal rate and regular rhythm.     Heart sounds: No murmur heard. Pulmonary:     Effort: Pulmonary effort is normal. No respiratory distress.     Breath sounds: Normal breath sounds.  Abdominal:     Palpations: Abdomen is soft.     Tenderness: There is abdominal tenderness in the right lower quadrant. There is guarding.  Musculoskeletal:        General: No swelling.     Cervical back: Neck supple.  Skin:  General: Skin is warm.     Capillary Refill: Capillary refill takes less than 2 seconds.  Neurological:     Mental Status: She is alert.  Psychiatric:        Mood and Affect: Mood normal.     ED Results / Procedures / Treatments   Labs (all labs ordered are listed, but only abnormal results are displayed) Labs Reviewed  COMPREHENSIVE METABOLIC PANEL - Abnormal; Notable for the following components:      Result Value   Sodium 128 (*)    Chloride 96 (*)    Glucose, Bld 127 (*)    Calcium 8.8 (*)    All other components within normal limits  CBC - Abnormal; Notable for the following components:   WBC 16.6 (*)    All other components within normal limits  URINALYSIS, ROUTINE W REFLEX MICROSCOPIC - Abnormal; Notable for the following components:   APPearance CLOUDY (*)    Hgb urine dipstick LARGE (*)    Leukocytes,Ua  TRACE (*)    All other components within normal limits  URINALYSIS, MICROSCOPIC (REFLEX) - Abnormal; Notable for the following components:   Bacteria, UA RARE (*)    All other components within normal limits  LIPASE, BLOOD    EKG EKG Interpretation  Date/Time:  Thursday July 22 2022 16:44:45 EDT Ventricular Rate:  78 PR Interval:  131 QRS Duration: 72 QT Interval:  355 QTC Calculation: 405 R Axis:   63 Text Interpretation: Sinus rhythm Right atrial enlargement Confirmed by Alona Bene [phone]) on 07/22/2022 5:08:11 PM  Radiology CT Angio Chest/Abd/Pel for Dissection W and/or Wo Contrast  Result Date: 07/22/2022 CLINICAL DATA:  Near syncope, abdominal pain, aortic aneurysm suspected EXAM: CT ANGIOGRAPHY CHEST, ABDOMEN AND PELVIS TECHNIQUE: Non-contrast CT of the chest was initially obtained. Multidetector CT imaging through the chest, abdomen and pelvis was performed using the standard protocol during bolus administration of intravenous contrast. Multiplanar reconstructed images and MIPs were obtained and reviewed to evaluate the vascular anatomy. RADIATION DOSE REDUCTION: This exam was performed according to the departmental dose-optimization program which includes automated exposure control, adjustment of the mA and/or kV according to patient size and/or use of iterative reconstruction technique. CONTRAST:  OMNIPAQUE IOHEXOL 350 MG/ML SOLN COMPARISON:  None Available. FINDINGS: CTA CHEST FINDINGS Cardiovascular: Thoracic aorta is unremarkable without aneurysm or dissection. Great vessels are widely patent. The heart is unremarkable without pericardial effusion. While not optimized for opacification of the pulmonary vasculature, there is sufficient contrast enhancement to exclude pulmonary emboli. There are no filling defects. Mediastinum/Nodes: No enlarged mediastinal, hilar, or axillary lymph nodes. Thyroid gland, trachea, and esophagus demonstrate no significant findings.  Lungs/Pleura: No acute airspace disease, effusion, or pneumothorax. Central airways are patent. Musculoskeletal: No acute or destructive bony lesions. Benign T4 hemangioma. Reconstructed images demonstrate no additional findings. Review of the MIP images confirms the above findings. CTA ABDOMEN AND PELVIS FINDINGS VASCULAR Aorta: Normal caliber aorta without aneurysm, dissection, vasculitis or significant stenosis. Celiac: Patent without evidence of aneurysm, dissection, vasculitis or significant stenosis. SMA: Patent without evidence of aneurysm, dissection, vasculitis or significant stenosis. Renals: Both renal arteries are patent without evidence of aneurysm, dissection, vasculitis, fibromuscular dysplasia or significant stenosis. IMA: Patent without evidence of aneurysm, dissection, vasculitis or significant stenosis. Inflow: Patent without evidence of aneurysm, dissection, vasculitis or significant stenosis. Mild atherosclerosis of the left common iliac artery. Veins: No obvious venous abnormality within the limitations of this arterial phase study. Review of the MIP images confirms the above  findings. NON-VASCULAR Hepatobiliary: Diffuse decreased liver attenuation consistent with hepatic steatosis. 1 cm cyst inferior right lobe liver. No focal parenchymal abnormalities. Gallbladder is unremarkable. No biliary duct dilation. Pancreas: Unremarkable. No pancreatic ductal dilatation or surrounding inflammatory changes. Spleen: Normal in size without focal abnormality. Adrenals/Urinary Tract: Right adrenal is unremarkable. Thickening of the medial limb of the left adrenal gland measures 9 mm, with an attenuation of 9 HU, consistent with adenoma or hyperplasia. Kidneys enhance normally. No urinary tract calculi or obstructive uropathy. The bladder is moderately distended, with no filling defect. Stomach/Bowel: There is a dilated inflamed appendix in the right lower quadrant, measuring up to 13 mm in diameter  reference image 154/6. There is mural thickening in the appendix, with periappendiceal fat stranding consistent with acute appendicitis. No evidence of perforation or abscess. No bowel obstruction. Mild distension of the distal Maija Biggers bowel may reflect regional ileus given the inflammatory changes associated with appendicitis. Lymphatic: No pathologic adenopathy within the abdomen or pelvis. Reproductive: There is a multilobular cystic and solid structure within the left adnexa, measuring 4.5 x 4.6 x 4.2 cm reference image 171/6. It is unclear whether this is associated with the uterus or arises from the left ovary. Differential diagnosis would include left ovarian mass versus pedunculated fibroid. Pelvic ultrasound is recommended when patient is clinically able. Right adnexa is grossly unremarkable. Other: Trace free fluid in the right lower quadrant. No free intraperitoneal gas. No abdominal wall hernia. Musculoskeletal: No acute or destructive bony lesions. Reconstructed images demonstrate no additional findings. Review of the MIP images confirms the above findings. IMPRESSION: Vascular: 1. No evidence of thoracoabdominal aortic aneurysm or dissection. 2. No evidence of pulmonary embolus. Nonvascular: 1. Acute uncomplicated appendicitis.  No perforation or abscess. 2. Complex cystic and solid mass within the region of the left adnexa. It is unclear whether this arises from the left ovary or is associated with uterus. Further evaluation with pelvic ultrasound is recommended when clinical situation permits. 3. Mild distension of the distal Laquashia Mergenthaler bowel consistent with regional ileus. No evidence of high-grade obstruction. 4. Hepatic steatosis. 5. Left adrenal adenoma.  No follow-up imaging is recommended. Electronically Signed   By: Sharlet Salina M.D.   On: 07/22/2022 18:55    Procedures Procedures    Medications Ordered in ED Medications  piperacillin-tazobactam (ZOSYN) IVPB 3.375 g (3.375 g Intravenous New  Bag/Given 07/22/22 1810)  sodium chloride 0.9 % bolus 1,000 mL (0 mLs Intravenous Stopped 07/22/22 1929)  iohexol (OMNIPAQUE) 350 MG/ML injection 100 mL (100 mLs Intravenous Contrast Given 07/22/22 1839)  fentaNYL (SUBLIMAZE) injection 50 mcg (50 mcg Intravenous Given 07/22/22 1905)  ondansetron (ZOFRAN) injection 4 mg (4 mg Intravenous Given 07/22/22 1901)  ketorolac (TORADOL) 15 MG/ML injection 15 mg (15 mg Intravenous Given 07/22/22 1927)    ED Course/ Medical Decision Making/ A&P                             Medical Decision Making Patient is a 57 year old female, here for a right lower quadrant pain, has been going on for the past day had a syncopal episode yesterday and was cleared by [facility], states that the abdominal pain however has gotten worse and that they did not evaluate.  She does have right lower quadrant pain, with guarding, we will obtain a CTA chest/abdomen/pelvis, for further evaluation to rule out dissection as she is gray appearing and diaphoretic.  And she did have a syncopal episode  yesterday.  I am suspicious for persistent possible appendicitis versus dissection.  Amount and/or Complexity of Data Reviewed Labs: ordered.    Details: Leukocytosis of 16 K Radiology: ordered.    Details: CTA chest/abdomen/pelvis, shows adnexal mass where she will need to follow-up on, but also uncomplicated appendicitis. Discussion of management or test interpretation with external provider(s): Discussed with patient, have found to have uncomplicated appendicitis started on Zosyn given pain medications, I spoke with Dr. Luisa Hart, who accepts admission of patient, transfer to [facility].  Patient started on Zosyn, discussed plans with patient, pain under control.  Admitted to Dr. Luisa Hart for surgery in the a.m.  Is allowed to clear liquids until midnight and then n.p.o.  She was advised on the adnexal mass, and will follow-up on this.  Risk Prescription drug management. Decision  regarding hospitalization.   Final Clinical Impression(s) / ED Diagnoses Final diagnoses:  Acute appendicitis with localized peritonitis, without perforation or abscess, unspecified whether gangrene present  Adnexal mass    Rx / DC Orders ED Discharge Orders     None         Pete Pelt, PA 07/22/22 1955    Maia Plan, MD 07/23/22 [phone]

## 2022-07-22 NOTE — Telephone Encounter (Signed)
Attempted to contact pt and was unable to LVM 

## 2022-07-22 NOTE — Telephone Encounter (Signed)
noted 

## 2022-07-22 NOTE — Telephone Encounter (Signed)
Maryann returned call regarding Serena.  Told her recommendations per Dr. Claiborne Billings.  She stated patient was sleeping.  Confirmed appt tomorrow with Dr. Claiborne Billings.

## 2022-07-23 ENCOUNTER — Other Ambulatory Visit: Payer: Self-pay

## 2022-07-23 ENCOUNTER — Ambulatory Visit: Payer: 59 | Admitting: Family Medicine

## 2022-07-23 ENCOUNTER — Inpatient Hospital Stay (HOSPITAL_BASED_OUTPATIENT_CLINIC_OR_DEPARTMENT_OTHER): Payer: 59 | Admitting: Certified Registered Nurse Anesthetist

## 2022-07-23 ENCOUNTER — Encounter (HOSPITAL_COMMUNITY): Admission: EM | Disposition: A | Discharge status: Home / Self Care | Payer: Self-pay | Source: Home / Self Care

## 2022-07-23 ENCOUNTER — Inpatient Hospital Stay (HOSPITAL_COMMUNITY): Payer: 59 | Admitting: Certified Registered Nurse Anesthetist

## 2022-07-23 ENCOUNTER — Encounter (HOSPITAL_COMMUNITY): Payer: Self-pay

## 2022-07-23 DIAGNOSIS — K353 Acute appendicitis with localized peritonitis, without perforation or gangrene: Secondary | ICD-10-CM | POA: Diagnosis not present

## 2022-07-23 DIAGNOSIS — R1031 Right lower quadrant pain: Secondary | ICD-10-CM | POA: Diagnosis not present

## 2022-07-23 DIAGNOSIS — F17210 Nicotine dependence, cigarettes, uncomplicated: Secondary | ICD-10-CM | POA: Diagnosis not present

## 2022-07-23 DIAGNOSIS — F418 Other specified anxiety disorders: Secondary | ICD-10-CM | POA: Diagnosis not present

## 2022-07-23 DIAGNOSIS — K3580 Unspecified acute appendicitis: Secondary | ICD-10-CM | POA: Diagnosis not present

## 2022-07-23 DIAGNOSIS — Z79899 Other long term (current) drug therapy: Secondary | ICD-10-CM | POA: Diagnosis not present

## 2022-07-23 HISTORY — PX: LAPAROSCOPIC APPENDECTOMY: SHX408

## 2022-07-23 LAB — CBC
HCT: 33.9 % — ABNORMAL LOW (ref 36.0–46.0)
Hemoglobin: 11.4 g/dL — ABNORMAL LOW (ref 12.0–15.0)
MCH: 31.3 pg (ref 26.0–34.0)
MCHC: 33.6 g/dL (ref 30.0–36.0)
MCV: 93.1 fL (ref 80.0–100.0)
Platelets: 204 10*3/uL (ref 150–400)
RBC: 3.64 MIL/uL — ABNORMAL LOW (ref 3.87–5.11)
RDW: 12.7 % (ref 11.5–15.5)
WBC: 15 10*3/uL — ABNORMAL HIGH (ref 4.0–10.5)
nRBC: 0 % (ref 0.0–0.2)

## 2022-07-23 LAB — BASIC METABOLIC PANEL
Anion gap: 8 (ref 5–15)
BUN: 7 mg/dL (ref 6–20)
CO2: 22 mmol/L (ref 22–32)
Calcium: 8 mg/dL — ABNORMAL LOW (ref 8.9–10.3)
Chloride: 102 mmol/L (ref 98–111)
Creatinine, Ser: 0.63 mg/dL (ref 0.44–1.00)
GFR, Estimated: >60 mL/min (ref >60)
Glucose, Bld: 137 mg/dL — ABNORMAL HIGH (ref 70–99)
Potassium: 3.1 mmol/L — ABNORMAL LOW (ref 3.5–5.1)
Sodium: 132 mmol/L — ABNORMAL LOW (ref 135–145)

## 2022-07-23 LAB — HIV ANTIBODY (ROUTINE TESTING W REFLEX): HIV Screen 4th Generation wRfx: NONREACTIVE

## 2022-07-23 SURGERY — APPENDECTOMY, LAPAROSCOPIC
Anesthesia: General

## 2022-07-23 MED ORDER — ENOXAPARIN SODIUM 40 MG/0.4ML IJ SOSY: 40.0000 mg | PREFILLED_SYRINGE | INTRAMUSCULAR | Status: DC

## 2022-07-23 MED ORDER — LACTATED RINGERS IR SOLN
Status: DC | PRN
  Administered 2022-07-23: 1000 mL

## 2022-07-23 MED ORDER — OXYCODONE HCL 5 MG PO TABS
5.0000 mg | ORAL_TABLET | ORAL | Status: DC | PRN
  Administered 2022-07-23: 10 mg via ORAL
  Administered 2022-07-24: 5 mg via ORAL
  Filled 2022-07-23: qty 2
  Filled 2022-07-23: qty 1

## 2022-07-23 MED ORDER — 0.9 % SODIUM CHLORIDE (POUR BTL) OPTIME
TOPICAL | Status: DC | PRN
  Administered 2022-07-23: 1000 mL

## 2022-07-23 MED ORDER — ONDANSETRON HCL 4 MG/2ML IJ SOLN: 4.0000 mg | Freq: Four times a day (QID) | INTRAMUSCULAR | Status: DC | PRN

## 2022-07-23 MED ORDER — MEPERIDINE HCL 50 MG/ML IJ SOLN: 6.2500 mg | INTRAMUSCULAR | Status: DC | PRN

## 2022-07-23 MED ORDER — DEXAMETHASONE SODIUM PHOSPHATE 10 MG/ML IJ SOLN
INTRAMUSCULAR | Status: DC | PRN
  Administered 2022-07-23: 5 mg via INTRAVENOUS

## 2022-07-23 MED ORDER — PROCHLORPERAZINE EDISYLATE 10 MG/2ML IJ SOLN
10.0000 mg | Freq: Once | INTRAMUSCULAR | Status: AC
  Administered 2022-07-23: 10 mg via INTRAVENOUS
  Filled 2022-07-23: qty 2

## 2022-07-23 MED ORDER — MIDAZOLAM HCL 2 MG/2ML IJ SOLN
INTRAMUSCULAR | Status: AC
  Filled 2022-07-23: qty 2

## 2022-07-23 MED ORDER — PROMETHAZINE HCL 25 MG/ML IJ SOLN: 6.2500 mg | INTRAMUSCULAR | Status: DC | PRN

## 2022-07-23 MED ORDER — HYDROMORPHONE HCL 1 MG/ML IJ SOLN: 0.2500 mg | INTRAMUSCULAR | Status: DC | PRN

## 2022-07-23 MED ORDER — EPHEDRINE SULFATE-NACL 50-0.9 MG/10ML-% IV SOSY
PREFILLED_SYRINGE | INTRAVENOUS | Status: DC | PRN
  Administered 2022-07-23: 5 mg via INTRAVENOUS

## 2022-07-23 MED ORDER — PHENYLEPHRINE 80 MCG/ML (10ML) SYRINGE FOR IV PUSH (FOR BLOOD PRESSURE SUPPORT)
PREFILLED_SYRINGE | INTRAVENOUS | Status: DC | PRN
  Administered 2022-07-23 (×2): 80 ug via INTRAVENOUS

## 2022-07-23 MED ORDER — METHOCARBAMOL 500 MG PO TABS: 500.0000 mg | ORAL_TABLET | Freq: Three times a day (TID) | ORAL | Status: DC | PRN

## 2022-07-23 MED ORDER — CHLORHEXIDINE GLUCONATE 0.12 % MT SOLN
15.0000 mL | Freq: Once | OROMUCOSAL | Status: AC
  Administered 2022-07-23: 15 mL via OROMUCOSAL

## 2022-07-23 MED ORDER — PROPOFOL 10 MG/ML IV BOLUS
INTRAVENOUS | Status: AC
  Filled 2022-07-23: qty 20

## 2022-07-23 MED ORDER — METHOCARBAMOL 1000 MG/10ML IJ SOLN: 500.0000 mg | Freq: Three times a day (TID) | INTRAVENOUS | Status: DC | PRN

## 2022-07-23 MED ORDER — FENTANYL CITRATE (PF) 100 MCG/2ML IJ SOLN
INTRAMUSCULAR | Status: DC | PRN
  Administered 2022-07-23 (×2): 50 ug via INTRAVENOUS

## 2022-07-23 MED ORDER — PIPERACILLIN-TAZOBACTAM 3.375 G IVPB
3.3750 g | Freq: Three times a day (TID) | INTRAVENOUS | Status: DC
  Administered 2022-07-23 – 2022-07-24 (×4): 3.375 g via INTRAVENOUS
  Filled 2022-07-23 (×5): qty 50

## 2022-07-23 MED ORDER — LACTATED RINGERS IV SOLN: INTRAVENOUS | Status: DC

## 2022-07-23 MED ORDER — MORPHINE SULFATE (PF) 2 MG/ML IV SOLN: 2.0000 mg | INTRAVENOUS | Status: DC | PRN

## 2022-07-23 MED ORDER — DIPHENHYDRAMINE HCL 50 MG/ML IJ SOLN
25.0000 mg | Freq: Once | INTRAMUSCULAR | Status: AC
  Administered 2022-07-23: 25 mg via INTRAVENOUS
  Filled 2022-07-23: qty 1

## 2022-07-23 MED ORDER — MIDAZOLAM HCL 2 MG/2ML IJ SOLN: 0.5000 mg | Freq: Once | INTRAMUSCULAR | Status: DC | PRN

## 2022-07-23 MED ORDER — ACETAMINOPHEN 325 MG PO TABS
650.0000 mg | ORAL_TABLET | Freq: Four times a day (QID) | ORAL | Status: DC | PRN
  Administered 2022-07-23: 650 mg via ORAL
  Filled 2022-07-23: qty 2

## 2022-07-23 MED ORDER — ROCURONIUM BROMIDE 10 MG/ML (PF) SYRINGE
PREFILLED_SYRINGE | INTRAVENOUS | Status: DC | PRN
  Administered 2022-07-23: 60 mg via INTRAVENOUS

## 2022-07-23 MED ORDER — SUGAMMADEX SODIUM 200 MG/2ML IV SOLN
INTRAVENOUS | Status: DC | PRN
  Administered 2022-07-23: 200 mg via INTRAVENOUS

## 2022-07-23 MED ORDER — PROPOFOL 10 MG/ML IV BOLUS
INTRAVENOUS | Status: DC | PRN
  Administered 2022-07-23: 100 mg via INTRAVENOUS

## 2022-07-23 MED ORDER — POTASSIUM CHLORIDE CRYS ER 20 MEQ PO TBCR
40.0000 meq | EXTENDED_RELEASE_TABLET | Freq: Two times a day (BID) | ORAL | Status: AC
  Administered 2022-07-23: 40 meq via ORAL
  Filled 2022-07-23: qty 2

## 2022-07-23 MED ORDER — METOPROLOL TARTRATE 5 MG/5ML IV SOLN: 5.0000 mg | Freq: Four times a day (QID) | INTRAVENOUS | Status: DC | PRN

## 2022-07-23 MED ORDER — FENTANYL CITRATE (PF) 100 MCG/2ML IJ SOLN
INTRAMUSCULAR | Status: AC
  Filled 2022-07-23: qty 2

## 2022-07-23 MED ORDER — SODIUM CHLORIDE 0.9 % IV SOLN: INTRAVENOUS | Status: DC

## 2022-07-23 MED ORDER — DEXTROSE-NACL 5-0.9 % IV SOLN: INTRAVENOUS | Status: DC

## 2022-07-23 MED ORDER — OXYCODONE HCL 5 MG/5ML PO SOLN: 5.0000 mg | Freq: Once | ORAL | Status: DC | PRN

## 2022-07-23 MED ORDER — BUPIVACAINE HCL (PF) 0.25 % IJ SOLN
INTRAMUSCULAR | Status: DC | PRN
  Administered 2022-07-23: 15 mL

## 2022-07-23 MED ORDER — HYDROMORPHONE HCL 1 MG/ML IJ SOLN: 1.0000 mg | INTRAMUSCULAR | Status: DC | PRN

## 2022-07-23 MED ORDER — MIDAZOLAM HCL 5 MG/5ML IJ SOLN
INTRAMUSCULAR | Status: DC | PRN
  Administered 2022-07-23: 2 mg via INTRAVENOUS

## 2022-07-23 MED ORDER — ONDANSETRON 4 MG PO TBDP: 4.0000 mg | ORAL_TABLET | Freq: Four times a day (QID) | ORAL | Status: DC | PRN

## 2022-07-23 MED ORDER — OXYCODONE HCL 5 MG PO TABS: 5.0000 mg | ORAL_TABLET | Freq: Once | ORAL | Status: DC | PRN

## 2022-07-23 MED ORDER — LIDOCAINE 2% (20 MG/ML) 5 ML SYRINGE
INTRAMUSCULAR | Status: DC | PRN
  Administered 2022-07-23: 40 mg via INTRAVENOUS

## 2022-07-23 MED ORDER — ONDANSETRON HCL 4 MG/2ML IJ SOLN
INTRAMUSCULAR | Status: DC | PRN
  Administered 2022-07-23: 4 mg via INTRAVENOUS

## 2022-07-23 SURGICAL SUPPLY — 40 items
ADH SKN CLS APL DERMABOND .7 (GAUZE/BANDAGES/DRESSINGS) ×1
APL PRP STRL LF DISP 70% ISPRP (MISCELLANEOUS) ×1
APPLIER CLIP ROT 10 11.4 M/L (STAPLE)
APR CLP MED LRG 11.4X10 (STAPLE)
BAG COUNTER SPONGE SURGICOUNT (BAG) IMPLANT
BAG SPNG CNTER NS LX DISP (BAG)
CABLE HIGH FREQUENCY MONO STRZ (ELECTRODE) ×1 IMPLANT
CHLORAPREP W/TINT 26 (MISCELLANEOUS) ×1 IMPLANT
CLIP APPLIE ROT 10 11.4 M/L (STAPLE) IMPLANT
CUTTER FLEX LINEAR 45M (STAPLE) IMPLANT
DERMABOND ADVANCED .7 DNX12 (GAUZE/BANDAGES/DRESSINGS) ×1 IMPLANT
ELECT REM PT RETURN 15FT ADLT (MISCELLANEOUS) ×1 IMPLANT
ENDOLOOP SUT PDS II  0 18 (SUTURE)
ENDOLOOP SUT PDS II 0 18 (SUTURE) IMPLANT
GLOVE BIO SURGEON STRL SZ7.5 (GLOVE) ×1 IMPLANT
GOWN STRL REUS W/ TWL LRG LVL3 (GOWN DISPOSABLE) IMPLANT
GOWN STRL REUS W/TWL LRG LVL3 (GOWN DISPOSABLE)
IRRIG SUCT STRYKERFLOW 2 WTIP (MISCELLANEOUS) ×1
IRRIGATION SUCT STRKRFLW 2 WTP (MISCELLANEOUS) ×1 IMPLANT
KIT BASIN OR (CUSTOM PROCEDURE TRAY) ×1 IMPLANT
KIT TURNOVER KIT A (KITS) IMPLANT
PENCIL SMOKE EVACUATOR (MISCELLANEOUS) IMPLANT
RELOAD 45 THICK GREEN (ENDOMECHANICALS) ×1 IMPLANT
RELOAD 45 VASCULAR/THIN (ENDOMECHANICALS) IMPLANT
RELOAD STAPLE 45 2.5 WHT GRN (ENDOMECHANICALS) IMPLANT
RELOAD STAPLE 45 3.5 BLU ETS (ENDOMECHANICALS) IMPLANT
RELOAD STAPLE 45 GRN THCK ETS (ENDOMECHANICALS) IMPLANT
RELOAD STAPLE TA45 3.5 REG BLU (ENDOMECHANICALS) IMPLANT
SCISSORS LAP 5X35 DISP (ENDOMECHANICALS) ×1 IMPLANT
SET TUBE SMOKE EVAC HIGH FLOW (TUBING) ×1 IMPLANT
SHEARS HARMONIC ACE PLUS 36CM (ENDOMECHANICALS) ×1 IMPLANT
SPIKE FLUID TRANSFER (MISCELLANEOUS) ×1 IMPLANT
SUT MNCRL AB 4-0 PS2 18 (SUTURE) ×1 IMPLANT
SYS BAG RETRIEVAL 10MM (BASKET) ×1
SYSTEM BAG RETRIEVAL 10MM (BASKET) ×1 IMPLANT
TOWEL OR 17X26 10 PK STRL BLUE (TOWEL DISPOSABLE) ×1 IMPLANT
TRAY FOLEY MTR SLVR 16FR STAT (SET/KITS/TRAYS/PACK) IMPLANT
TRAY LAPAROSCOPIC (CUSTOM PROCEDURE TRAY) ×1 IMPLANT
TROCAR BALLN 12MMX100 BLUNT (TROCAR) ×1 IMPLANT
TROCAR Z-THREAD OPTICAL 5X100M (TROCAR) ×1 IMPLANT

## 2022-07-23 NOTE — Anesthesia Preprocedure Evaluation (Addendum)
Anesthesia Evaluation  Patient identified by MRN, date of birth, ID band Patient awake    Reviewed: Allergy & Precautions, NPO status , Patient's Chart, lab work & pertinent test results  History of Anesthesia Complications Negative for: history of anesthetic complications  Airway Mallampati: II  TM Distance: >3 FB Neck ROM: Full    Dental  (+) Dental Advisory Given   Pulmonary Current Smoker and Patient abstained from smoking.   breath sounds clear to auscultation       Cardiovascular negative cardio ROS  Rhythm:Regular Rate:Normal     Neuro/Psych  Headaches  Anxiety Depression       GI/Hepatic Neg liver ROS,,,N/v with acute appy   Endo/Other  negative endocrine ROS    Renal/GU negative Renal ROS     Musculoskeletal   Abdominal   Peds  Hematology negative hematology ROS (+)   Anesthesia Other Findings   Reproductive/Obstetrics                             Anesthesia Physical Anesthesia Plan  ASA: 2  Anesthesia Plan: General   Post-op Pain Management: Ofirmev IV (intra-op)*   Induction: Intravenous and Rapid sequence  PONV Risk Score and Plan: 2 and Ondansetron and Dexamethasone  Airway Management Planned: Oral ETT  Additional Equipment: None  Intra-op Plan:   Post-operative Plan: Extubation in OR  Informed Consent: I have reviewed the patients History and Physical, chart, labs and discussed the procedure including the risks, benefits and alternatives for the proposed anesthesia with the patient or authorized representative who has indicated his/her understanding and acceptance.     Dental advisory given  Plan Discussed with: CRNA and Surgeon  Anesthesia Plan Comments:         Anesthesia Quick Evaluation

## 2022-07-23 NOTE — Op Note (Signed)
07/23/2022  12:03 PM  PATIENT:  Michelle Arnold  57 y.o. female  PRE-OPERATIVE DIAGNOSIS:  ACUTE APPENDICITIS  POST-OPERATIVE DIAGNOSIS:  ACUTE APPENDICITIS  PROCEDURE:  Procedure(s): APPENDECTOMY LAPAROSCOPIC (N/A)  SURGEON:  Surgeon(s) and Role:    * Griselda Miner, MD - Primary  PHYSICIAN ASSISTANT:   ASSISTANTS: none   ANESTHESIA:   local and general  EBL:  minimal   BLOOD ADMINISTERED:none  DRAINS: none   LOCAL MEDICATIONS USED:  MARCAINE     SPECIMEN:  Source of Specimen:  appendix  DISPOSITION OF SPECIMEN:  PATHOLOGY  COUNTS:  YES  TOURNIQUET:  * No tourniquets in log *  DICTATION: .Dragon Dictation  After informed consent was obtained patient was brought to the operating room placed in the supine position on the operating room table. After adequate induction of general anesthesia the patient's abdomen was prepped with ChloraPrep, allowed to dry, and draped in usual sterile manner. The area below the umbilicus was infiltrated with quarter percent Marcaine. A small incision was made with a 15 blade knife. This incision was carried down through the subcutaneous tissue bluntly with a hemostat and [employer] retractors until the linea alba was identified. The linea alba was incised with a 15 blade knife. Each side was grasped Coker clamps and elevated anteriorly. The preperitoneal space was probed bluntly with a hemostat until the peritoneum was opened and access was gained to the abdominal cavity. A 0 Vicryl purse string stitch was placed in the fascia surrounding the opening. A Hassan cannula was placed through the opening and anchored in place with the previously placed Vicryl purse string stitch. The laparoscope was placed through the [facility] cannula. The abdomen was insufflated with carbon dioxide without difficulty. Next the suprapubic area was infiltrated with quarter percent Marcaine. A small incision was made with a 15 blade knife. A 5 mm port was placed bluntly through  this incision into the abdominal cavity. A site was then chosen between the 2 port for placement of a 5 mm port. The area was infiltrated with quarter percent Marcaine. A small stab incision was made with a 15 blade knife. A 5 mm port was placed bluntly through this incision and the abdominal cavity under direct vision. The laparoscope was then moved to the suprapubic port. Using a Glassman grasper and harmonic scalpel the right lower quadrant was inspected. The appendix was readily identified. The appendix was elevated anteriorly and the mesoappendix was taken down sharply with the harmonic scalpel. Once the base of the appendix where it joined the cecum was identified and cleared of any tissue then a laparoscopic GIA blue load 6 row stapler was placed through the [facility] cannula. The stapler was placed across the base of the appendix clamped and fired thereby dividing the base of the appendix between staple lines. A laparoscopic bag was then inserted through the [facility] cannula. The appendix was placed within the bag and the bag was sealed. The abdomen was then irrigated with copious amounts of saline until the effluent was clear. No other abnormalities were noted. The appendix and bag were removed with the [facility] cannula through the infraumbilical port without difficulty. The fascial defect was closed with the previously placed Vicryl pursestring stitch as well as with another interrupted 0 Vicryl figure-of-eight stitch. The rest of the ports were removed under direct vision and were found to be hemostatic. The gas was allowed to escape. The skin incisions were closed with interrupted 4-0 Monocryl subcuticular stitches. Dermabond dressings were applied. The  patient tolerated the procedure well. At the end of the case all needle sponge and instrument counts were correct. The patient was then awakened and taken to recovery in stable condition.After informed consent was obtained patient was brought to the operating  room placed in the supine position on the operating room table. After adequate induction of general anesthesia the patient's abdomen was prepped with ChloraPrep, allowed to dry, and draped in usual sterile manner. The area below the umbilicus was infiltrated with quarter percent Marcaine. A small incision was made with a 15 blade knife. This incision was carried down through the subcutaneous tissue bluntly with a hemostat and [employer]-Nav[employer] retractors until the linea alba was identified. The linea alba was incised with a 15 blade knife. Each side was grasped Coker clamps and elevated anteriorly. The preperitoneal space was probed bluntly with a hemostat until the peritoneum was opened and access was gained to the abdominal cavity. A 0 Vicryl purse string stitch was placed in the fascia surrounding the opening. A Hassan cannula was placed through the opening and anchored in place with the previously placed Vicryl purse string stitch. The laparoscope was placed through the [facility] cannula. The abdomen was insufflated with carbon dioxide without difficulty. Next the suprapubic area was infiltrated with quarter percent Marcaine. A small incision was made with a 15 blade knife. A 5 mm port was placed bluntly through this incision into the abdominal cavity. A site was then chosen between the 2 port for placement of a 5 mm port. The area was infiltrated with quarter percent Marcaine. A small stab incision was made with a 15 blade knife. A 5 mm port was placed bluntly through this incision and the abdominal cavity under direct vision. The laparoscope was then moved to the suprapubic port. Using a Glassman grasper and harmonic scalpel the right lower quadrant was inspected. The appendix was readily identified. The appendix was elevated anteriorly and the mesoappendix was taken down sharply with the harmonic scalpel. Once the base of the appendix where it joined the cecum was identified and cleared of any tissue then a laparoscopic  GIA blue load 6 row stapler was placed through the [facility] cannula. The stapler was placed across the base of the appendix clamped and fired thereby dividing the base of the appendix between staple lines. A laparoscopic bag was then inserted through the [facility] cannula. The appendix was placed within the bag and the bag was sealed. The abdomen was then irrigated with copious amounts of saline until the effluent was clear.  Of note the left ovary was noted to be enlarged and firm and was not as mobile as the right ovary.  The appendix and bag were removed with the [facility] cannula through the infraumbilical port without difficulty. The fascial defect was closed with the previously placed Vicryl pursestring stitch as well as with another interrupted 0 Vicryl figure-of-eight stitch. The rest of the ports were removed under direct vision and were found to be hemostatic. The gas was allowed to escape. The skin incisions were closed with interrupted 4-0 Monocryl subcuticular stitches. Dermabond dressings were applied. The patient tolerated the procedure well. At the end of the case all needle sponge and instrument counts were correct. The patient was then awakened and taken to recovery in stable condition.  PLAN OF CARE: Admit for overnight observation  PATIENT DISPOSITION:  PACU - hemodynamically stable.   Delay start of Pharmacological VTE agent (>24hrs) due to surgical blood loss or risk of bleeding: no

## 2022-07-23 NOTE — Discharge Instructions (Signed)
[facility]  Please arrive at least 30 min before your appointment to complete your check in paperwork.  If you are unable to arrive 30 min prior to your appointment time we may have to cancel or reschedule you. LAPAROSCOPIC SURGERY: POST OP INSTRUCTIONS Always review your discharge instruction sheet given to you by the facility where your surgery was performed. IF YOU HAVE DISABILITY OR FAMILY LEAVE FORMS, YOU MUST BRING THEM TO THE OFFICE FOR PROCESSING.   DO NOT GIVE THEM TO YOUR DOCTOR.  PAIN CONTROL  First take acetaminophen (Tylenol) AND/or ibuprofen (Advil) to control your pain after surgery.  Follow directions on package.  Taking acetaminophen (Tylenol) and/or ibuprofen (Advil) regularly after surgery will help to control your pain and lower the amount of prescription pain medication you may need.  You should not take more than 4,000 mg (4 grams) of acetaminophen (Tylenol) in 24 hours.  You should not take ibuprofen (Advil), aleve, motrin, naprosyn or other NSAIDS if you have a history of stomach ulcers or chronic kidney disease.  A prescription for pain medication may be given to you upon discharge.  Take your pain medication as prescribed, if you still have uncontrolled pain after taking acetaminophen (Tylenol) or ibuprofen (Advil). Use ice packs to help control pain. If you need a refill on your pain medication, please contact your pharmacy.  They will contact our office to request authorization. Prescriptions will not be filled after 5pm or on week-ends.  HOME MEDICATIONS Take your usually prescribed medications unless otherwise directed.  DIET You should follow a light diet the first few days after arrival home.  Be sure to include lots of fluids daily. Avoid fatty, fried foods.   CONSTIPATION It is common to experience some constipation after surgery and if you are taking pain medication.  Increasing fluid intake and taking a stool softener (such as Colace)  will usually help or prevent this problem from occurring.  A mild laxative (Milk of Magnesia or Miralax) should be taken according to package instructions if there are no bowel movements after 48 hours.  WOUND/INCISION CARE Most patients will experience some swelling and bruising in the area of the incisions.  Ice packs will help.  Swelling and bruising can take several days to resolve.  Unless discharge instructions indicate otherwise, follow guidelines below  STERI-STRIPS - you may remove your outer bandages 48 hours after surgery, and you may shower at that time.  You have steri-strips (small skin tapes) in place directly over the incision.  These strips should be left on the skin for 7-10 days.   DERMABOND/SKIN GLUE - you may shower in 24 hours.  The glue will flake off over the next 2-3 weeks. Any sutures or staples will be removed at the office during your follow-up visit.  ACTIVITIES You may resume regular (light) daily activities beginning the next day--such as daily self-care, walking, climbing stairs--gradually increasing activities as tolerated.  You may have sexual intercourse when it is comfortable.  Refrain from any heavy lifting or straining until approved by your doctor. You may drive when you are no longer taking prescription pain medication, you can comfortably wear a seatbelt, and you can safely maneuver your car and apply brakes.  FOLLOW-UP You should see your doctor in the office for a follow-up appointment approximately 2-3 weeks after your surgery.  You should have been given your post-op/follow-up appointment when your surgery was scheduled.  If you did not receive a post-op/follow-up appointment, make sure   that you call for this appointment within a day or two after you arrive home to insure a convenient appointment time.   WHEN TO CALL YOUR DOCTOR: Fever over 101.0 Inability to urinate Continued bleeding from incision. Increased pain, redness, or drainage from the  incision. Increasing abdominal pain  The clinic staff is available to answer your questions during regular business hours.  Please don't hesitate to call and ask to speak to one of the nurses for clinical concerns.  If you have a medical emergency, go to the nearest emergency room or call 911.  A surgeon from [facility] is always on call at the hospital. [address], [address], [place], [place]  [address] ? [address] [address], [place], [place]   [address] [phone] ? [phone] ? FAX [phone]  

## 2022-07-23 NOTE — H&P (Signed)
Amanpreet Delmont Rayner Mar 19, 1966  [id number].    Requesting MD: Barnie Alderman, PA-C Chief Complaint/Reason for Consult: Acute Appendicitis   HPI: CRYSTINA BORRAYO is a 57 y.o. female who presented to [facility] on 4/18 for abdominal pain.  She reports 2 days ago she began having mid abdominal pain with associated diaphoresis, nausea and vomiting.  Nausea and vomiting has since resolved but her pain has continued and now become more generalized but worst in the RLQ. No fever, cp, sob, or urinary symptoms. Passing flatus. No bm since symptom onset. She denies hx of constipation; usually has bm every 2 days. No recent diarrheal illness. No hx of similar symptoms in the past. Denies hx of upper abdominal pain after po intake prior to admission.   To note patient was seen on 4/17 after a syncopal episode.  Reports she was on her front porch, felt some tingling in bilateral legs and then passed out.  She did hit her head. No focal weakness, dizziness, visual changes, CP, SOB or unilateral n/t/w prior to syncope. Seen in [facility] ED where [facility] was negative.  Per notes her blood pressure on arrival was low with dry mucous membranes.  CTH was negative. Tn neg and labs reassuring. Symptoms resolved after IVF. She did have some bradycardia while she was there. No recurrence of her symptoms since discharge from their ED. Abdominal pain started sometime after syncopal episode.   She presented to [facility] for evaluation.  There she was noted to be febrile to 100.7.  No tachycardia or bradycardia.  No hypotension. WBC 16.6 > 15. K 3.1. Cr wnl. Na 128 > 132 after IVF. CTA CAP w/ acute appendicitis without evidence of perforation or abscess.  There is associated ileus without evidence of obstruction. Also noted to have 4.5 x 4.6 x 4.2 cm multilobular cystic and solid structure within the left adnexa; unclear whether this is associated with the uterus or arises from the left ovary.   Last Colonoscopy: 2015, done at outside facility. I cannot  see these results but she reports this was negative - no polyps etc.  Prior Abdominal Surgeries: None Blood Thinners: None Last PO intake: Currently written for CLD but reports she has been NPO since midnight.  Tobacco Use: 1 PPD Alcohol Use: Occasional  Substance use: None Employment: Self employed, office work  Denies a history of MI, CVA, asthma, or COPD. At baseline states he mobilizes without an assistive device and can climb a flight of stairs without stopping due to DOE or chest pain.  ROS: ROS As above, see hpi  Family History  Problem Relation Age of Onset   Diabetes Mother        ?   Osteoporosis Mother    Hyperlipidemia Father    Heart disease Father        MI in 72   Hypertension Father    Heart attack Father 77   Cancer Maternal Grandmother        lung- smoker   Osteoporosis Maternal Grandmother    Cancer Maternal Grandfather        prostate   Cataracts Paternal Grandmother    Other Paternal Grandfather        PAD with gangrene   Breast cancer Maternal Aunt     Past Medical History:  Diagnosis Date   Allergy    Chicken pox as a child   CTS (carpal tunnel syndrome)    right   Depression with anxiety    Dermatitis  11/09/2011   Right arm   Fatigue 11/09/2011   Hidradenitis suppurativa 12/16/2011   Hyperlipidemia    Measles as a child   Migraine 11/09/2011   Mumps as a child   Other abnormal Papanicolaou smear of cervix and cervical HPV(795.09) 2004   required LEEP procedure in 04 but no recurrence   Perimenopausal    Polydipsia 01/11/2018   Tobacco abuse 11/09/2011   Vitamin D deficiency     Past Surgical History:  Procedure Laterality Date   LEEP  2004    Social History:  reports that she has been smoking cigarettes. She has a 30.00 pack-year smoking history. She has never used smokeless tobacco. She reports current alcohol use. She reports that she does not use drugs.  Allergies:  Allergies  Allergen Reactions   Abilify [Aripiprazole] Other  (See Comments)    Suicidal thoughts.    Atorvastatin     Memory loss   Statins     Medications Prior to Admission  Medication Sig Dispense Refill   alendronate (FOSAMAX) 70 MG tablet Take 1 tablet (70 mg total) by mouth every 7 (seven) days. Take with a full glass of water on an empty stomach. 4 tablet 11   Cholecalciferol (VITAMIN D3) 1000 units CAPS Take 2 capsules by mouth daily.     ezetimibe (ZETIA) 10 MG tablet Take 1 tablet (10 mg total) by mouth daily. 90 tablet 3   PARoxetine (PAXIL) 20 MG tablet Take 1 tablet (20 mg total) by mouth daily. 90 tablet 1     Physical Exam: Blood pressure 99/60, pulse 76, temperature 99.5 F (37.5 C), temperature source Oral, resp. rate 16, height 5\' 4"  (1.626 m), weight 56.1 kg, SpO2 98 %. General: pleasant, WD/WN female who is laying in bed in NAD HEENT:  Sclera are noninjected.  PERRL.  Ears and nose without any masses or lesions.  Mouth is pink and moist. Dentition fair Heart: regular, rate, and rhythm.   Lungs: CTAB, no wheezes, rhonchi, or rales noted.  Respiratory effort nonlabored Abd:  Soft, ND, generalized ttp greatest in the RLQ, no rigidity or guarding. +BS. No masses, hernias, or organomegaly MS: no BUE or BLE edema Skin: warm and dry  Psych: A&Ox4 with an appropriate affect Neuro: Speech clear. Follows commands. No facial droop. PERRLA. EOMI. CN III-XII grossly intact.  Grossly moves all extremities 4.  Able and appropriate strength for age to upper and lower extremities bilaterally including grip strength & plantar flexion/dorsiflexion and hip flexion. SILT. No pronator drift.   Results for orders placed or performed during the hospital encounter of 07/22/22 (from the past 48 hour(s))  Lipase, blood     Status: None   Collection Time: 07/22/22  4:36 PM  Result Value Ref Range   Lipase 44 11 - 51 U/L    Comment: Performed at [facility], [address] [place], [place] [address]  Comprehensive metabolic panel      Status: Abnormal   Collection Time: 07/22/22  4:36 PM  Result Value Ref Range   Sodium 128 (L) 135 - 145 mmol/L   Potassium 3.6 3.5 - 5.1 mmol/L   Chloride 96 (L) 98 - 111 mmol/L   CO2 23 22 - 32 mmol/L   Glucose, Bld 127 (H) 70 - 99 mg/dL    Comment: Glucose reference range applies only to samples taken after fasting for at least 8 hours.   BUN 7 6 - 20 mg/dL   Creatinine, Ser 2.95 0.44 -  1.00 mg/dL   Calcium 8.8 (L) 8.9 - 10.3 mg/dL   Total Protein 7.2 6.5 - 8.1 g/dL   Albumin 3.8 3.5 - 5.0 g/dL   AST 23 15 - 41 U/L   ALT 18 0 - 44 U/L   Alkaline Phosphatase 54 38 - 126 U/L   Total Bilirubin 1.0 0.3 - 1.2 mg/dL   GFR, Estimated >81 >19 mL/min    Comment: (NOTE) Calculated using the CKD-EPI Creatinine Equation (2021)    Anion gap 9 5 - 15    Comment: Performed at [facility], [address] [place], [place] [address]  CBC     Status: Abnormal   Collection Time: 07/22/22  4:36 PM  Result Value Ref Range   WBC 16.6 (H) 4.0 - 10.5 K/uL   RBC 4.03 3.87 - 5.11 MIL/uL   Hemoglobin 12.4 12.0 - 15.0 g/dL   HCT 95.6 21.3 - 08.6 %   MCV 89.6 80.0 - 100.0 fL   MCH 30.8 26.0 - 34.0 pg   MCHC 34.3 30.0 - 36.0 g/dL   RDW 57.8 46.9 - 62.9 %   Platelets 277 150 - 400 K/uL   nRBC 0.0 0.0 - 0.2 %    Comment: Performed at [facility], [address] [place], [place] [address]  Urinalysis, Routine w reflex microscopic -Urine, Clean Catch     Status: Abnormal   Collection Time: 07/22/22  4:36 PM  Result Value Ref Range   Color, Urine YELLOW YELLOW   APPearance CLOUDY (A) CLEAR   Specific Gravity, Urine 1.010 1.005 - 1.030   pH 6.5 5.0 - 8.0   Glucose, UA NEGATIVE NEGATIVE mg/dL   Hgb urine dipstick LARGE (A) NEGATIVE   Bilirubin Urine NEGATIVE NEGATIVE   Ketones, ur NEGATIVE NEGATIVE mg/dL   Protein, ur NEGATIVE NEGATIVE mg/dL   Nitrite NEGATIVE NEGATIVE   Leukocytes,Ua TRACE (A) NEGATIVE    Comment: Performed at [facility], [address]  [place], [place] [address]  Urinalysis, Microscopic (reflex)     Status: Abnormal   Collection Time: 07/22/22  4:36 PM  Result Value Ref Range   RBC / HPF 6-10 0 - 5 RBC/hpf   WBC, UA 0-5 0 - 5 WBC/hpf   Bacteria, UA RARE (A) NONE SEEN   Squamous Epithelial / HPF 0-5 0 - 5 /HPF    Comment: Performed at [facility], [address] [place], [place] [address]  Basic metabolic panel     Status: Abnormal   Collection Time: 07/23/22  3:44 AM  Result Value Ref Range   Sodium 132 (L) 135 - 145 mmol/L   Potassium 3.1 (L) 3.5 - 5.1 mmol/L   Chloride 102 98 - 111 mmol/L   CO2 22 22 - 32 mmol/L   Glucose, Bld 137 (H) 70 - 99 mg/dL    Comment: Glucose reference range applies only to samples taken after fasting for at least 8 hours.   BUN 7 6 - 20 mg/dL   Creatinine, Ser 5.36 0.44 - 1.00 mg/dL   Calcium 8.0 (L) 8.9 - 10.3 mg/dL   GFR, Estimated >64 >40 mL/min    Comment: (NOTE) Calculated using the CKD-EPI Creatinine Equation (2021)    Anion gap 8 5 - 15    Comment: Performed at [facility], [address] [place], [place] [address]  CBC     Status: Abnormal   Collection Time: 07/23/22  3:44  AM  Result Value Ref Range   WBC 15.0 (H) 4.0 - 10.5 K/uL   RBC 3.64 (L) 3.87 - 5.11 MIL/uL   Hemoglobin 11.4 (L) 12.0 - 15.0 g/dL   HCT 16.1 (L) 09.6 - 04.5 %   MCV 93.1 80.0 - 100.0 fL   MCH 31.3 26.0 - 34.0 pg   MCHC 33.6 30.0 - 36.0 g/dL   RDW 40.9 81.1 - 91.4 %   Platelets 204 150 - 400 K/uL   nRBC 0.0 0.0 - 0.2 %    Comment: Performed at [facility], [address] [place], [place] [address]   CT Angio Chest/Abd/Pel for Dissection W and/or Wo Contrast  Result Date: 07/22/2022 CLINICAL DATA:  Near syncope, abdominal pain, aortic aneurysm suspected EXAM: CT ANGIOGRAPHY CHEST, ABDOMEN AND PELVIS TECHNIQUE: Non-contrast CT of the chest was initially obtained. Multidetector CT imaging through the chest, abdomen and pelvis was performed using the  standard protocol during bolus administration of intravenous contrast. Multiplanar reconstructed images and MIPs were obtained and reviewed to evaluate the vascular anatomy. RADIATION DOSE REDUCTION: This exam was performed according to the departmental dose-optimization program which includes automated exposure control, adjustment of the mA and/or kV according to patient size and/or use of iterative reconstruction technique. CONTRAST:  OMNIPAQUE IOHEXOL 350 MG/ML SOLN COMPARISON:  None Available. FINDINGS: CTA CHEST FINDINGS Cardiovascular: Thoracic aorta is unremarkable without aneurysm or dissection. Great vessels are widely patent. The heart is unremarkable without pericardial effusion. While not optimized for opacification of the pulmonary vasculature, there is sufficient contrast enhancement to exclude pulmonary emboli. There are no filling defects. Mediastinum/Nodes: No enlarged mediastinal, hilar, or axillary lymph nodes. Thyroid gland, trachea, and esophagus demonstrate no significant findings. Lungs/Pleura: No acute airspace disease, effusion, or pneumothorax. Central airways are patent. Musculoskeletal: No acute or destructive bony lesions. Benign T4 hemangioma. Reconstructed images demonstrate no additional findings. Review of the MIP images confirms the above findings. CTA ABDOMEN AND PELVIS FINDINGS VASCULAR Aorta: Normal caliber aorta without aneurysm, dissection, vasculitis or significant stenosis. Celiac: Patent without evidence of aneurysm, dissection, vasculitis or significant stenosis. SMA: Patent without evidence of aneurysm, dissection, vasculitis or significant stenosis. Renals: Both renal arteries are patent without evidence of aneurysm, dissection, vasculitis, fibromuscular dysplasia or significant stenosis. IMA: Patent without evidence of aneurysm, dissection, vasculitis or significant stenosis. Inflow: Patent without evidence of aneurysm, dissection, vasculitis or significant  stenosis. Mild atherosclerosis of the left common iliac artery. Veins: No obvious venous abnormality within the limitations of this arterial phase study. Review of the MIP images confirms the above findings. NON-VASCULAR Hepatobiliary: Diffuse decreased liver attenuation consistent with hepatic steatosis. 1 cm cyst inferior right lobe liver. No focal parenchymal abnormalities. Gallbladder is unremarkable. No biliary duct dilation. Pancreas: Unremarkable. No pancreatic ductal dilatation or surrounding inflammatory changes. Spleen: Normal in size without focal abnormality. Adrenals/Urinary Tract: Right adrenal is unremarkable. Thickening of the medial limb of the left adrenal gland measures 9 mm, with an attenuation of 9 HU, consistent with adenoma or hyperplasia. Kidneys enhance normally. No urinary tract calculi or obstructive uropathy. The bladder is moderately distended, with no filling defect. Stomach/Bowel: There is a dilated inflamed appendix in the right lower quadrant, measuring up to 13 mm in diameter reference image 154/6. There is mural thickening in the appendix, with periappendiceal fat stranding consistent with acute appendicitis. No evidence of perforation or abscess. No bowel obstruction. Mild distension of the distal small bowel may reflect regional ileus given the inflammatory changes associated with appendicitis.  Lymphatic: No pathologic adenopathy within the abdomen or pelvis. Reproductive: There is a multilobular cystic and solid structure within the left adnexa, measuring 4.5 x 4.6 x 4.2 cm reference image 171/6. It is unclear whether this is associated with the uterus or arises from the left ovary. Differential diagnosis would include left ovarian mass versus pedunculated fibroid. Pelvic ultrasound is recommended when patient is clinically able. Right adnexa is grossly unremarkable. Other: Trace free fluid in the right lower quadrant. No free intraperitoneal gas. No abdominal wall hernia.  Musculoskeletal: No acute or destructive bony lesions. Reconstructed images demonstrate no additional findings. Review of the MIP images confirms the above findings. IMPRESSION: Vascular: 1. No evidence of thoracoabdominal aortic aneurysm or dissection. 2. No evidence of pulmonary embolus. Nonvascular: 1. Acute uncomplicated appendicitis.  No perforation or abscess. 2. Complex cystic and solid mass within the region of the left adnexa. It is unclear whether this arises from the left ovary or is associated with uterus. Further evaluation with pelvic ultrasound is recommended when clinical situation permits. 3. Mild distension of the distal small bowel consistent with regional ileus. No evidence of high-grade obstruction. 4. Hepatic steatosis. 5. Left adrenal adenoma.  No follow-up imaging is recommended. Electronically Signed   By: Sharlet Salina M.D.   On: 07/22/2022 18:55    Anti-infectives (From admission, onward)    Start     Dose/Rate Route Frequency Ordered Stop   07/23/22 0300  piperacillin-tazobactam (ZOSYN) IVPB 3.375 g        3.375 g 12.5 mL/hr over 240 Minutes Intravenous Every 8 hours 07/23/22 0154     07/22/22 1800  piperacillin-tazobactam (ZOSYN) IVPB 3.375 g  Status:  Discontinued        3.375 g 12.5 mL/hr over 240 Minutes Intravenous Every 6 hours 07/22/22 1758 07/23/22 0154       Assessment/Plan Acute Appendicitis History, exam and imaging consistent with acute appendicitis. No evidence of perforation or abscess on CT. Discussed operative vs non-operative intervention.  I have explained the procedure, risks, and aftercare of Laparoscopic Appendectomy.  Risks include but are not limited to anesthesia (MI, CVA, death, aspiration, prolonged intubation), bleeding, infection, wound problems, hernia, injury to surrounding structures (viscus, nerves, blood vessels, ureter), need for conversion to open procedure or ileocecectomy, post operative ileus or abscess, stump leak, stump  appendicitis and increased risk of DVT/PE. She does have 4.5 x 4.6 x 4.2 cm multilobular cystic and solid structure within the left adnexa not on CT - unclear whether this is associated with the uterus or arises from the left ovary. After discussing with MD, will plan to eval intra-op and then determine timing of further workup ([place]) and referral (GYN) post op. She does not have a GYN currently (remote hx of LEEP but not currently following with anyone, LMP at age 39). She seems to understand and agrees to proceed with surgery. Will discuss with MD if he would like any further w/u before surgery with recent hx of syncope 2 days ago. If not, will plan for surgery today. Keep NPO. Cont IV abx.   FEN - NPO, IVF VTE - SCDs, Lovenox ID - Zosyn  I reviewed nursing notes, ED provider notes, last 24 h vitals and pain scores, last 48 h intake and output, last 24 h labs and trends, and last 24 h imaging results.  Jacinto Halim, [facility] 07/23/2022, 8:53 AM Please see Amion for pager number during day hours 7:00am-4:30pm

## 2022-07-23 NOTE — Anesthesia Postprocedure Evaluation (Signed)
Anesthesia Post Note  Patient: Michelle Arnold  Procedure(s) Performed: APPENDECTOMY LAPAROSCOPIC     Patient location during evaluation: PACU Anesthesia Type: General Level of consciousness: awake and alert, patient cooperative and oriented Pain management: pain level controlled Vital Signs Assessment: post-procedure vital signs reviewed and stable Respiratory status: spontaneous breathing, nonlabored ventilation and respiratory function stable Cardiovascular status: blood pressure returned to baseline and stable Postop Assessment: no apparent nausea or vomiting Anesthetic complications: no   No notable events documented.  Last Vitals:  Vitals:   07/23/22 1243 07/23/22 1318  BP: (!) 93/59 (!) 90/50  Pulse: 69 64  Resp: 12 15  Temp: 36.4 C 36.6 C  SpO2: 94% 95%    Last Pain:  Vitals:   07/23/22 1318  TempSrc: Oral  PainSc:                  [name],[name]

## 2022-07-23 NOTE — Anesthesia Procedure Notes (Signed)
Procedure Name: Intubation Date/Time: 07/23/2022 11:08 AM  Performed by: Basilio Cairo, CRNAPre-anesthesia Checklist: Patient identified, Patient being monitored, Timeout performed, Emergency Drugs available and Suction available Patient Re-evaluated:Patient Re-evaluated prior to induction Oxygen Delivery Method: Circle system utilized Preoxygenation: Pre-oxygenation with 100% oxygen Induction Type: IV induction Ventilation: Mask ventilation without difficulty Laryngoscope Size: Mac and 3 Grade View: Grade I Tube type: Oral Tube size: 7.0 mm Number of attempts: 1 Airway Equipment and Method: Stylet Placement Confirmation: ETT inserted through vocal cords under direct vision, positive ETCO2 and breath sounds checked- equal and bilateral Secured at: 21 cm Tube secured with: Tape Dental Injury: Teeth and Oropharynx as per pre-operative assessment

## 2022-07-23 NOTE — Interval H&P Note (Signed)
History and Physical Interval Note:  07/23/2022 10:24 AM  Michelle Arnold  has presented today for surgery, with the diagnosis of ACUTE APPENDICITIS.  The various methods of treatment have been discussed with the patient and family. After consideration of risks, benefits and other options for treatment, the patient has consented to  Procedure(s): APPENDECTOMY LAPAROSCOPIC (N/A) as a surgical intervention.  The patient's history has been reviewed, patient examined, no change in status, stable for surgery.  I have reviewed the patient's chart and labs.  Questions were answered to the patient's satisfaction.     Chevis Pretty III

## 2022-07-23 NOTE — Progress Notes (Signed)
Discussed with on call GYN, Dr. Donavan Foil, about patients CT scan finding of 4.5 x 4.6 x 4.2 cm multilobular cystic and solid structure within the left adnexa. Recommended outpatient follow up at [facility] at [address] [place], [place], [address].  This was included in patient's AVS.

## 2022-07-23 NOTE — Transfer of Care (Signed)
Immediate Anesthesia Transfer of Care Note  Patient: Michelle Arnold  Procedure(s) Performed: Procedure(s): APPENDECTOMY LAPAROSCOPIC (N/A)  Patient Location: PACU  Anesthesia Type:General  Level of Consciousness: Alert, Awake, Oriented  Airway & Oxygen Therapy: Patient Spontanous Breathing  Post-op Assessment: Report given to RN  Post vital signs: Reviewed and stable  Last Vitals:  Vitals:   07/23/22 0547 07/23/22 0942  BP: 99/60 (!) 97/59  Pulse: 76 65  Resp: 16 16  Temp: 37.5 C 36.9 C  SpO2: 98% 99%    Complications: No apparent anesthesia complications

## 2022-07-23 NOTE — Plan of Care (Signed)
  Problem: Clinical Measurements: Goal: Ability to maintain clinical measurements within normal limits will improve Outcome: Progressing Goal: Diagnostic test results will improve Outcome: Progressing   Problem: Activity: Goal: Risk for activity intolerance will decrease Outcome: Progressing   Problem: Nutrition: Goal: Adequate nutrition will be maintained Outcome: Progressing   Problem: Pain Managment: Goal: General experience of comfort will improve Outcome: Progressing   Problem: Safety: Goal: Ability to remain free from injury will improve Outcome: Progressing   Problem: Skin Integrity: Goal: Risk for impaired skin integrity will decrease Outcome: Progressing   

## 2022-07-24 ENCOUNTER — Encounter (HOSPITAL_COMMUNITY): Payer: Self-pay | Admitting: General Surgery

## 2022-07-24 MED ORDER — OXYCODONE HCL 5 MG PO TABS: 5.0000 mg | ORAL_TABLET | ORAL | 0 refills | Status: DC | PRN

## 2022-07-24 MED ORDER — AMOXICILLIN-POT CLAVULANATE 500-125 MG PO TABS: 1.0000 | ORAL_TABLET | Freq: Three times a day (TID) | ORAL | 0 refills | Status: DC

## 2022-07-24 NOTE — Discharge Summary (Signed)
Physician Discharge Summary  Patient ID: Michelle Arnold MRN: [id number] DOB/AGE: 59-Apr-1967 59 y.o.  Admit date: 07/22/2022 Discharge date: 07/24/2022  Admission Diagnoses:appendicitis  Discharge Diagnoses:  Principal Problem:   Acute appendicitis   Discharged Condition: good  Hospital Course: PT admitted and underwent lap appy, see op note for full details.   Pt post op was stated on CLD and adv to reg diet and tol that well. She had good pain control and was amb well on her own.  She was deemed stable for DC and DCd home.  Consults: None  Significant Diagnostic Studies: CT scan : appendicitis  Treatments: surgery: as above  Discharge Exam: Blood pressure (!) 98/54, pulse (!) 59, temperature 98.6 F (37 C), resp. rate 18, height  (1.626 m), weight 54.4 kg, SpO2 98 %. General appearance: alert and cooperative GI: soft, non-tender; bowel sounds normal; no masses,  no organomegaly and inc c/d/i  Disposition: Discharge disposition: 01-Home or Self Care       Discharge Instructions     Diet - low sodium heart healthy   Complete by: As directed    Increase activity slowly   Complete by: As directed       Allergies as of 07/24/2022       Reactions   Abilify [aripiprazole] Other (See Comments)   Suicidal thoughts.    Atorvastatin Other (See Comments)   Memory loss   Statins Other (See Comments)   Memory loss         Medication List     TAKE these medications    acetaminophen 500 MG tablet Commonly known as: TYLENOL Take 1,000 mg by mouth as needed for moderate pain.   alendronate 70 MG tablet Commonly known as: FOSAMAX Take 1 tablet (70 mg total) by mouth every 7 (seven) days. Take with a full glass of water on an empty stomach. What changed:  when to take this additional instructions   amoxicillin-clavulanate 500-125 MG tablet Commonly known as: Augmentin Take 1 tablet by mouth 3 (three) times daily for 4 days.   ezetimibe 10 MG  tablet Commonly known as: ZETIA Take 1 tablet (10 mg total) by mouth daily.   fluticasone 50 MCG/ACT nasal spray Commonly known as: FLONASE Place 1 spray into both nostrils as needed for allergies or rhinitis.   ibuprofen 200 MG tablet Commonly known as: ADVIL Take 400 mg by mouth as needed for moderate pain.   oxyCODONE 5 MG immediate release tablet Commonly known as: Oxy IR/ROXICODONE Take 1-2 tablets (5-10 mg total) by mouth every 4 (four) hours as needed for severe pain or moderate pain (5 mg for moderate pain, 10 mg for severe pain).   PARoxetine 20 MG tablet Commonly known as: Paxil Take 1 tablet (20 mg total) by mouth daily.   TUMS PO Take 2 tablets by mouth as needed (reflux).        Follow-up Information     [facility] for [facility] Follow up.   Specialty: Obstetrics and Gynecology Why: For follow up regarding your left adenexal mass noted on CT. Contact information: [address] [place] [address] [phone]        Maczis, Hedda [place], [place] Follow up.   Specialty: General Surgery Why: Please call to confirm your appointment date/time, Bring a copy of your photo ID & insurance card, Arrive 30 minutes prior to your appointment for paperwork Contact information: [address] [place] [address]  [phone]         Kuneff, Renee A, DO Follow up.   Specialty: Family Medicine Contact information: [address] [place] [address] [phone]                 Signed: Axel Filler 07/24/2022, 7:47 AM

## 2022-07-24 NOTE — Progress Notes (Signed)
  Transition of Care [facility]) Screening Note   Patient Details  Name: Michelle Arnold Date of Birth: April 19, 1965   Transition of Care [facility]) CM/SW Contact:    Adrian Prows, RN Phone Number: 07/24/2022, 10:41 AM    Transition of Care Department [facility]) has reviewed patient and no TOC needs have been identified at this time. We will continue to monitor patient advancement through interdisciplinary progression rounds. If new patient transition needs arise, please place a TOC consult.

## 2022-07-24 NOTE — Plan of Care (Signed)
Patient is awake, alert and orientated x4. Pain managed with PRN meds. Remains on RA. Up ambulating in hallway independently. Tolerating diet.  Patient is stable for discharge as per MD order. Spouse at bedside. Patient is ready for discharge. No signs or symptoms of acute distress at this time. Problem: Education: Goal: Knowledge of General Education information will improve Description: Including pain rating scale, medication(s)/side effects and non-pharmacologic comfort measures Outcome: Adequate for Discharge   Problem: Health Behavior/Discharge Planning: Goal: Ability to manage health-related needs will improve Outcome: Adequate for Discharge   Problem: Clinical Measurements: Goal: Ability to maintain clinical measurements within normal limits will improve Outcome: Adequate for Discharge Goal: Will remain free from infection Outcome: Adequate for Discharge Goal: Diagnostic test results will improve Outcome: Adequate for Discharge Goal: Respiratory complications will improve Outcome: Adequate for Discharge Goal: Cardiovascular complication will be avoided Outcome: Adequate for Discharge   Problem: Activity: Goal: Risk for activity intolerance will decrease Outcome: Adequate for Discharge   Problem: Nutrition: Goal: Adequate nutrition will be maintained Outcome: Adequate for Discharge   Problem: Coping: Goal: Level of anxiety will decrease Outcome: Adequate for Discharge   Problem: Elimination: Goal: Will not experience complications related to bowel motility Outcome: Adequate for Discharge Goal: Will not experience complications related to urinary retention Outcome: Adequate for Discharge   Problem: Pain Managment: Goal: General experience of comfort will improve Outcome: Adequate for Discharge   Problem: Safety: Goal: Ability to remain free from injury will improve Outcome: Adequate for Discharge   Problem: Skin Integrity: Goal: Risk for impaired skin integrity  will decrease Outcome: Adequate for Discharge

## 2022-07-24 NOTE — Plan of Care (Signed)
  Problem: Education: Goal: Knowledge of General Education information will improve Description: Including pain rating scale, medication(s)/side effects and non-pharmacologic comfort measures Outcome: Progressing   Problem: Clinical Measurements: Goal: Will remain free from infection Outcome: Progressing Goal: Cardiovascular complication will be avoided Outcome: Progressing   Problem: Activity: Goal: Risk for activity intolerance will decrease Outcome: Progressing   Problem: Nutrition: Goal: Adequate nutrition will be maintained Outcome: Progressing   Problem: Elimination: Goal: Will not experience complications related to bowel motility Outcome: Progressing   Problem: Pain Managment: Goal: General experience of comfort will improve Outcome: Progressing   Problem: Safety: Goal: Ability to remain free from injury will improve Outcome: Progressing   Problem: Skin Integrity: Goal: Risk for impaired skin integrity will decrease Outcome: Progressing

## 2022-07-26 LAB — SURGICAL PATHOLOGY

## 2022-07-27 ENCOUNTER — Encounter: Payer: Self-pay | Admitting: Family Medicine

## 2022-07-27 ENCOUNTER — Ambulatory Visit (INDEPENDENT_AMBULATORY_CARE_PROVIDER_SITE_OTHER): Payer: 59 | Admitting: Family Medicine

## 2022-07-27 ENCOUNTER — Telehealth: Payer: Self-pay

## 2022-07-27 VITALS — BP 103/66 | HR 59 | Temp 97.8°F | Wt 120.2 lb

## 2022-07-27 DIAGNOSIS — R1031 Right lower quadrant pain: Secondary | ICD-10-CM

## 2022-07-27 DIAGNOSIS — R1900 Intra-abdominal and pelvic swelling, mass and lump, unspecified site: Secondary | ICD-10-CM | POA: Diagnosis not present

## 2022-07-27 DIAGNOSIS — Z9289 Personal history of other medical treatment: Secondary | ICD-10-CM

## 2022-07-27 DIAGNOSIS — R55 Syncope and collapse: Secondary | ICD-10-CM | POA: Diagnosis not present

## 2022-07-27 DIAGNOSIS — R001 Bradycardia, unspecified: Secondary | ICD-10-CM

## 2022-07-27 DIAGNOSIS — Z9049 Acquired absence of other specified parts of digestive tract: Secondary | ICD-10-CM

## 2022-07-27 LAB — COMPREHENSIVE METABOLIC PANEL
ALT: 13 U/L (ref 0–35)
AST: 16 U/L (ref 0–37)
Albumin: 3.6 g/dL (ref 3.5–5.2)
Alkaline Phosphatase: 53 U/L (ref 39–117)
BUN: 9 mg/dL (ref 6–23)
CO2: 28 mEq/L (ref 19–32)
Calcium: 8.8 mg/dL (ref 8.4–10.5)
Chloride: 97 mEq/L (ref 96–112)
Creatinine, Ser: 0.53 mg/dL (ref 0.40–1.20)
GFR: 101.66 mL/min (ref >60.00)
Glucose, Bld: 83 mg/dL (ref 70–99)
Potassium: 4 mEq/L (ref 3.5–5.1)
Sodium: 136 mEq/L (ref 135–145)
Total Bilirubin: 0.4 mg/dL (ref 0.2–1.2)
Total Protein: 6 g/dL (ref 6.0–8.3)

## 2022-07-27 LAB — CBC WITH DIFFERENTIAL/PLATELET
Basophils Absolute: 0.2 10*3/uL — ABNORMAL HIGH (ref 0.0–0.1)
Basophils Relative: 1.4 % (ref 0.0–3.0)
Eosinophils Absolute: 0 10*3/uL (ref 0.0–0.7)
Eosinophils Relative: 0.3 % (ref 0.0–5.0)
HCT: 33.8 % — ABNORMAL LOW (ref 36.0–46.0)
Hemoglobin: 11.5 g/dL — ABNORMAL LOW (ref 12.0–15.0)
Lymphocytes Relative: 14.5 % (ref 12.0–46.0)
Lymphs Abs: 2.1 10*3/uL (ref 0.7–4.0)
MCHC: 33.9 g/dL (ref 30.0–36.0)
MCV: 90.8 fl (ref 78.0–100.0)
Monocytes Absolute: 0.9 10*3/uL (ref 0.1–1.0)
Monocytes Relative: 6.3 % (ref 3.0–12.0)
Neutro Abs: 11.2 10*3/uL — ABNORMAL HIGH (ref 1.4–7.7)
Neutrophils Relative %: 77.5 % — ABNORMAL HIGH (ref 43.0–77.0)
Platelets: 333 10*3/uL (ref 150.0–400.0)
RBC: 3.72 Mil/uL — ABNORMAL LOW (ref 3.87–5.11)
RDW: 12.7 % (ref 11.5–15.5)
WBC: 14.5 10*3/uL — ABNORMAL HIGH (ref 4.0–10.5)

## 2022-07-27 LAB — IBC + FERRITIN
Ferritin: 144.6 ng/mL (ref 10.0–291.0)
Iron: 14 ug/dL — ABNORMAL LOW (ref 42–145)
Saturation Ratios: 6 % — ABNORMAL LOW (ref 20.0–50.0)
TIBC: 232.4 ug/dL — ABNORMAL LOW (ref 250.0–450.0)
Transferrin: 166 mg/dL — ABNORMAL LOW (ref 212.0–360.0)

## 2022-07-27 MED ORDER — MUPIROCIN 2 % EX OINT: 1.0000 | TOPICAL_OINTMENT | Freq: Two times a day (BID) | CUTANEOUS | 0 refills | Status: DC

## 2022-07-27 NOTE — Progress Notes (Signed)
Michelle Arnold , March 17, 1966, 57 y.o., female MRN: [id number] Patient Care Team    Relationship Specialty Notifications Start End  Natalia Leatherwood, DO PCP - General Family Medicine  11/03/15     Chief Complaint  Patient presents with   Abdominal Pain    Recent lap app for appendicitis; pt not fasting     Subjective:  Michelle Arnold  is a 57 y.o. female presents for hospital follow up after recent admission on 07/22/2022 for primary diagnosis appendicitis.  Patient was discharged on 07/24/2022 to home. Patients discharge summary has been reviewed, as well as all labs/image studies obtained during hospitalization.  Medication reconciliation completed today.  Patients hospital course: Patient had a syncopal episode on 07/21/2022 in which she went to the emergency room.  She was found to have bradycardia at 49 bpm.  Blood pressure was stable.  CT head without any acute process or injury secondary to syncopal episode.  CBC, lipase, magnesium, BMP, troponin and urinalysis within normal limits.  Patient was discharged home.  She then presented to different ED 418/2024 with abdominal pain in which she was found to have appendicitis.  She underwent lap appendectomy and was admitted to the hospital.  She was discharged on 07/24/2022 home.   Since hospital discharge patient reports patient reports she has been doing okay.  She does not have an appetite but she has been trying to continue a bland soft diet.  She denies any nausea or vomit.  She denies any constipation.  She states she is feels very bloated since the surgery.  She has had an incidence where she thought she was going to pass out again since being home.  At that time her blood pressure was 101/56 and her heart rate was 59.  Patient's husband states she became shaky, diaphoretic and pale.  She sat down and symptoms resolved within a few minutes.  During her hospitalization with and workup for the syncope, she was found to have bradycardia 49 at  the time.  Her baseline heart rate prior to recent recurrences ranged in the 70-80 bpm, since her syncopal episode her baseline is less than 60. There also was an incidental finding of a pelvic mass on her CT.  Recommendations were to move forward with an ultrasound in the outpatient setting.  Was uncertain if the mass was extending from the ovary or uterus.  Recent Labs  Lab 07/22/22 1636 07/23/22 0344 07/27/22 1224  HGB 12.4 11.4* 11.5*  HCT 36.1 33.9* 33.8*  WBC 16.6* 15.0* 14.5*  PLT 277 204 333.0      Latest Ref Rng & Units 07/27/2022   12:24 PM 07/23/2022    3:44 AM 07/22/2022    4:36 PM  CMP  Glucose 70 - 99 mg/dL 83  782  956   BUN 6 - 23 mg/dL Creatinine 0.40 - 1.20 mg/dL 2.13  0.86  5.78   Sodium 135 - 145 mEq/L 136  132  128   Potassium 3.5 - 5.1 mEq/L 4.0  3.1  3.6   Chloride 96 - 112 mEq/L 97  102  96   CO2 19 - 32 mEq/L Calcium 8.4 - 10.5 mg/dL 8.8  8.0  8.8   Total Protein 6.0 - 8.3 g/dL 6.0   7.2   Total Bilirubin 0.2 - 1.2 mg/dL 0.4   1.0   Alkaline Phos 39 - 117 U/L  53   54   AST 0 - 37 U/L 16   23   ALT 0 - 35 U/L 13   18     CT Angio Chest/Abd/Pel for Dissection W and/or Wo Contrast Result Date: 07/22/2022 CLINICAL DATA:  Near syncope, abdominal pain IMPRESSION:   1. No evidence of thoracoabdominal aortic aneurysm or dissection.  2. No evidence of pulmonary embolus.  1. Acute uncomplicated appendicitis.  No perforation or abscess.  2. Complex cystic and solid mass within the region of the left adnexa. It is unclear whether this arises from the left ovary or is associated with uterus. Further evaluation with pelvic ultrasound is recommended when clinical situation permits.  3. Mild distension of the distal small bowel consistent with regional ileus. No evidence of high-grade obstruction.  4. Hepatic steatosis.  5. Left adrenal adenoma.      04/14/2022    9:43 AM 08/04/2021    9:55 AM 08/04/2021    9:13 AM 12/26/2020    9:48 AM  09/25/2020    9:44 AM  Depression screen PHQ 2/9  Decreased Interest 2 3 0 0 2  Down, Depressed, Hopeless 2 3 0 0 2  PHQ - 2 Score 4 6 0 0 4  Altered sleeping 2 2  0 0  Tired, decreased energy 2 2  0 0  Change in appetite 0 0  0 0  Feeling bad or failure about yourself  2 0  0 0  Trouble concentrating 3 0  0 0  Moving slowly or fidgety/restless 3 0  0 0  Suicidal thoughts 0 0  0 0  PHQ-9 Score 16 10  0 4    Allergies  Allergen Reactions   Abilify [Aripiprazole] Other (See Comments)    Suicidal thoughts.    Atorvastatin Other (See Comments)    Memory loss   Statins Other (See Comments)    Memory loss    Social History   Tobacco Use   Smoking status: Every Day    Packs/day: 1.00    Years: 30.00    Additional pack years: 0.00    Total pack years: 30.00    Types: Cigarettes   Smokeless tobacco: Never  Substance Use Topics   Alcohol use: Yes    Comment: very rare   Past Medical History:  Diagnosis Date   Allergy    Chicken pox as a child   CTS (carpal tunnel syndrome)    right   Depression with anxiety    Dermatitis 11/09/2011   Right arm   Fatigue 11/09/2011   Hidradenitis suppurativa 12/16/2011   Hyperlipidemia    Measles as a child   Migraine 11/09/2011   Mumps as a child   Other abnormal Papanicolaou smear of cervix and cervical HPV(795.09) 2004   required LEEP procedure in 04 but no recurrence   Perimenopausal    Polydipsia 01/11/2018   Tobacco abuse 11/09/2011   Vitamin D deficiency    Past Surgical History:  Procedure Laterality Date   LAPAROSCOPIC APPENDECTOMY N/A 07/23/2022   Procedure: APPENDECTOMY LAPAROSCOPIC;  Surgeon: Griselda Miner, MD;  Location: [facility];  Service: General;  Laterality: N/A;   LEEP  2004   Family History  Problem Relation Age of Onset   Diabetes Mother        ?   Osteoporosis Mother    Hyperlipidemia Father    Heart disease Father        MI in 95   Hypertension Father  Heart attack Father 3   Cancer Maternal  Grandmother        lung- smoker   Osteoporosis Maternal Grandmother    Cancer Maternal Grandfather        prostate   Cataracts Paternal Grandmother    Other Paternal Grandfather        PAD with gangrene   Breast cancer Maternal Aunt    Allergies as of 07/27/2022       Reactions   Abilify [aripiprazole] Other (See Comments)   Suicidal thoughts.    Atorvastatin Other (See Comments)   Memory loss   Statins Other (See Comments)   Memory loss         Medication List        Accurate as of July 27, 2022 11:59 PM. If you have any questions, ask your nurse or doctor.          STOP taking these medications    acetaminophen 500 MG tablet Commonly known as: TYLENOL Stopped by: Felix Pacini, DO   amoxicillin-clavulanate 500-125 MG tablet Commonly known as: Augmentin Stopped by: Felix Pacini, DO   ibuprofen 200 MG tablet Commonly known as: ADVIL Stopped by: Felix Pacini, DO       TAKE these medications    alendronate 70 MG tablet Commonly known as: FOSAMAX Take 1 tablet (70 mg total) by mouth every 7 (seven) days. Take with a full glass of water on an empty stomach. What changed:  when to take this additional instructions   ezetimibe 10 MG tablet Commonly known as: ZETIA Take 1 tablet (10 mg total) by mouth daily.   fluticasone 50 MCG/ACT nasal spray Commonly known as: FLONASE Place 1 spray into both nostrils as needed for allergies or rhinitis.   mupirocin ointment 2 % Commonly known as: BACTROBAN Apply 1 Application topically 2 (two) times daily. Started by: Felix Pacini, DO   oxyCODONE 5 MG immediate release tablet Commonly known as: Oxy IR/ROXICODONE Take 1-2 tablets (5-10 mg total) by mouth every 4 (four) hours as needed for severe pain or moderate pain (5 mg for moderate pain, 10 mg for severe pain).   PARoxetine 20 MG tablet Commonly known as: Paxil Take 1 tablet (20 mg total) by mouth daily.   TUMS PO Take 2 tablets by mouth as needed  (reflux).        All past medical history, surgical history, allergies, family history, immunizations and medications were updated in the EMR today and reviewed under the history and medication portions of their EMR.      ROS: Negative, with the exception of above mentioned in HPI   Objective:  BP 103/66   Pulse (!) 59   Temp 97.8 F (36.6 C)   Wt 120 lb 3.2 oz (54.5 kg)   LMP  (LMP Unknown)   SpO2 100%   BMI 20.63 kg/m  Body mass index is 20.63 kg/m. Physical Exam Vitals and nursing note reviewed.  Constitutional:      General: She is not in acute distress.    Appearance: Normal appearance. She is not ill-appearing, toxic-appearing or diaphoretic.  HENT:     Head: Normocephalic and atraumatic.  Eyes:     General: No scleral icterus.       Right eye: No discharge.        Left eye: No discharge.     Extraocular Movements: Extraocular movements intact.     Conjunctiva/sclera: Conjunctivae normal.     Pupils: Pupils are equal, round, and reactive to  light.  Cardiovascular:     Rate and Rhythm: Normal rate and regular rhythm.     Heart sounds: No murmur heard. Pulmonary:     Effort: Pulmonary effort is normal. No respiratory distress.     Breath sounds: Normal breath sounds. No wheezing, rhonchi or rales.  Abdominal:     General: Bowel sounds are normal. There is distension.     Comments: S/p lap app-port excisions healing nicely, no erythema or drainage  Musculoskeletal:     Right lower leg: No edema.     Left lower leg: No edema.  Skin:    General: Skin is warm.     Findings: No rash.  Neurological:     Mental Status: She is alert and oriented to person, place, and time. Mental status is at baseline.     Motor: No weakness.     Gait: Gait normal.  Psychiatric:        Mood and Affect: Mood normal.        Behavior: Behavior normal.        Thought Content: Thought content normal.        Judgment: Judgment normal.       Assessment/Plan: Michelle Arnold is a  57 y.o. female present for OV for Hospital discharge follow up Pelvic mass in female We discussed the finding of the pelvic mass today in more detail.  We will need to obtain an ultrasound pelvic/transvaginal likely.  However she is still rather tender status post her lap appendectomy.  We elected to put the order in for the ultrasound to be completed in about 2 weeks giving her some time to recover from her surgery. Referral to gynecology was placed for her as well today. - US Pelvic Complete With Transvaginal; Future - Ambulatory referral to Gynecology  Syncope, unspecified syncope type/Bradycardia The syncopal episode could have been related to the electrolyte disturbances and ileus/pain that was occurring from her inflamed appendix.  At the time of her ED visit they were focusing on her syncope from cardiac and neuro perspective.  Appendicitis was diagnosed the next day in a different ED. With mild bradycardia, baseline shift and her heart rate, she would benefit from ruling out arrhythmia or cardiac causes of the syncopal episode.  She would like referral to cardiology today. She did have a second event since being sent home in which she almost passed out again.  We discussed that could have been secondary to decreased nutrition, hypoglycemia, dehydration, electrolyte deficiency, anemia etc.  We placed the referral to cardiology and we will also check her for electrolyte disturbance, iron deficiency anemia - Ambulatory referral to Cardiology - IBC + Ferritin, CBC, CMP   Abd pain/S/P laparoscopic appendectomy Discussed with her bloating should improve with time and is common after a laparoscopic procedure. Encouraged her to continue bland soft diet as tolerated and advance as tolerated. Encouraged her to hydrate well, with electrolyte replacements including G2 like product. Encouraged her to use stool softeners with her pain medications.  Reviewed expectations re: course of current medical  issues. Discussed self-management of symptoms. Outlined signs and symptoms indicating need for more acute intervention. Patient verbalized understanding and all questions were answered. Patient received an After-Visit Summary. Any changes in medications were reviewed and patient was provided with updated med list with their AVS.     Orders Placed This Encounter  Procedures   US Pelvic Complete With Transvaginal   Comp Met (CMET)   CBC w/Diff   IBC +  Ferritin   Ambulatory referral to Gynecology   Ambulatory referral to Cardiology   55 minutes spent with patient covering 2 ED admission, hosp. Admission and referral required for specialty teams d/t incidental findings requiring further outpatient work up.   Note is dictated utilizing voice recognition software. Although note has been proof read prior to signing, occasional typographical errors still can be missed. If any questions arise, please do not hesitate to call for verification.   electronically signed by:  Felix Pacini, DO  [facility] - OR

## 2022-07-27 NOTE — Patient Instructions (Addendum)
You will be called from: Ultrasound on [address] > [place] ordered in 2 weeks to allow healing Cardiology at [facility] ([place])> make sure they are in your network.    Take in nutrition, gatorade2 and colace (stool softener).  Follow up with surgeon as directed

## 2022-07-27 NOTE — Transitions of Care (Post Inpatient/ED Visit) (Signed)
   07/27/2022  Name: Michelle Arnold MRN: [id number] DOB: 1965/10/06  Today's TOC FU Call Status: Today's TOC FU Call Status:: Successful TOC FU Call Competed TOC FU Call Complete Date: 07/27/22  Transition Care Management Follow-up Telephone Call Date of Discharge: 07/24/22 Discharge Facility: [facility]) Type of Discharge: Inpatient Admission Primary Inpatient Discharge Diagnosis:: "acute apendicitis" How have you been since you were released from the hospital?: Same (Pt states she is "doing just okay-stomach very distended, very gassy-LBM yesterday, decreased appetite." Pt states she went to sit at table this am and felt "lightheaded,dizzy, had chills and was clammy"-BP was 101/56 HR 56) Any questions or concerns?: Yes Patient Questions/Concerns:: pt wants to know if PCP recommends she follow up with OB/GYN that she was referred to by hospital staff or if there is another OB/GYN office provider recommends Patient Questions/Concerns Addressed: Notified Provider of Patient Questions/Concerns, Other: (Advised pt to discuss with provider during appt today)  Items Reviewed: Did you receive and understand the discharge instructions provided?: Yes Medications obtained and verified?: No (patient coming into PCP office shortly-instructed to bring all meds to MD appt for review) Any new allergies since your discharge?: No Dietary orders reviewed?: Yes Type of Diet Ordered:: low salt/heart healthy Do you have support at home?: Yes People in Home: spouse Name of Support/Comfort Primary Source: [facility] and Equipment/Supplies: Were Home Health Services Ordered?: NA Any new equipment or medical supplies ordered?: NA  Functional Questionnaire: Do you need assistance with bathing/showering or dressing?: No Do you need assistance with meal preparation?: No Do you need assistance with eating?: No Do you have difficulty maintaining continence: No Do you need assistance with getting out  of bed/getting out of a chair/moving?: No Do you have difficulty managing or taking your medications?: No  Follow up appointments reviewed: PCP Follow-up appointment confirmed?: Yes Date of PCP follow-up appointment?: 07/27/22 Follow-up Provider: Dr. Hollace Kinnier Specialist [facility] Follow-up appointment confirmed?: Yes Date of Specialist follow-up appointment?: 08/21/22 Follow-Up Specialty Provider:: Carlyon Shadow Do you need transportation to your follow-up appointment?: No Do you understand care options if your condition(s) worsen?: Yes-patient verbalized understanding  SDOH Interventions Today    Flowsheet Row Most Recent Value  SDOH Interventions   Food Insecurity Interventions Intervention Not Indicated  Transportation Interventions Intervention Not Indicated      TOC Interventions Today    Flowsheet Row Most Recent Value  TOC Interventions   TOC Interventions Discussed/Reviewed TOC Interventions Discussed, Post discharge activity limitations per provider, S/S of infection, Post op wound/incision care      Interventions Today    Flowsheet Row Most Recent Value  General Interventions   General Interventions Discussed/Reviewed General Interventions Discussed, Doctor Visits  Doctor Visits Discussed/Reviewed Doctor Visits Discussed, Specialist, PCP  PCP/Specialist Visits Compliance with follow-up visit  Education Interventions   Education Provided Provided Education  Provided Verbal Education On Nutrition, When to see the doctor, Medication, Other  [sx mgmt]  Nutrition Interventions   Nutrition Discussed/Reviewed Nutrition Discussed, Adding fruits and vegetables, Increaing proteins, Decreasing salt, Fluid intake  Pharmacy Interventions   Pharmacy Dicussed/Reviewed Pharmacy Topics Discussed, Medications and their functions  Safety Interventions   Safety Discussed/Reviewed Safety Discussed        Alessandra Grout [facility] Care Management Care  Management Community Coordinator Direct Phone: [phone] Toll Free: [phone] Fax: [phone]

## 2022-07-28 ENCOUNTER — Telehealth: Payer: Self-pay | Admitting: Family Medicine

## 2022-07-28 DIAGNOSIS — R55 Syncope and collapse: Secondary | ICD-10-CM | POA: Insufficient documentation

## 2022-07-28 HISTORY — DX: Syncope and collapse: R55

## 2022-07-28 MED ORDER — POLYSACCHARIDE IRON COMPLEX 150 MG PO CAPS: 150.0000 mg | ORAL_CAPSULE | Freq: Every day | ORAL | 0 refills | Status: DC

## 2022-07-28 NOTE — Progress Notes (Unsigned)
Cardiology Office Note   Date:  07/29/2022   ID:  Michelle Arnold, Michelle Arnold [birth year], MRN [id number]  PCP:  Natalia Leatherwood, DO  Cardiologist:   Rollene Rotunda, MD Referring:  Natalia Leatherwood, DO   Chief Complaint  Patient presents with   Loss of Consciousness      History of Present Illness: Michelle Arnold is a 57 y.o. female who presents for evaluation of syncope.  She is referred by Felix Pacini A, DO.   She was in the emergency room on April 17 at [facility] she had had syncope.  This happened at home.  She fell hitting the right side of her head.  There were no prodromal symptoms.  There is no seizure activity.  She had been not having any acute complaints prior to this.  I reviewed these records.  There was no clear etiology identified although she was said to be bradycardic.  She does have a baseline low blood pressure.  She was discharged from the emergency room but had to go back and actually went to [facility] ED the next day.  She had abdominal pain.  She was found on CT angiogram to have acute appendicitis.  I do note that the aortogram portion of this study was unremarkable.  There was no mention of atherosclerosis and certainly no dissection.  There is no mention of coronary atherosclerosis.  She was admitted and had appendectomy.  I reviewed these notes.  There were no cardiac complications.  Since going home she has had episodes of presyncope.  She has had abdominal discomfort with some gas and bloating.  This has been painful and with this she has felt lightheaded and clammy.  She has had presyncope about 3 times.  The last episode was yesterday.  She actually has not had that this morning.  Prior to this event she was not having any presyncope or syncope.  She does her household chores including vacuuming and going up stairs.  She denies any palpitations.  She does not have chest pressure, neck or arm discomfort.  She has no shortness of breath, PND or orthopnea.  Past Medical  History:  Diagnosis Date   Allergy    Chicken pox as a child   CTS (carpal tunnel syndrome)    right   Depression with anxiety    Dermatitis 11/09/2011   Right arm   Fatigue 11/09/2011   Hidradenitis suppurativa 12/16/2011   Hyperlipidemia    Measles as a child   Migraine 11/09/2011   Mumps as a child   Other abnormal Papanicolaou smear of cervix and cervical HPV(795.09) 2004   required LEEP procedure in 04 but no recurrence   Perimenopausal    Polydipsia 01/11/2018   Tobacco abuse 11/09/2011   Vitamin D deficiency     Past Surgical History:  Procedure Laterality Date   LAPAROSCOPIC APPENDECTOMY N/A 07/23/2022   Procedure: APPENDECTOMY LAPAROSCOPIC;  Surgeon: Chevis Pretty III, MD;  Location: [facility] ORS;  Service: General;  Laterality: N/A;   LEEP  2004     Current Outpatient Medications  Medication Sig Dispense Refill   alendronate (FOSAMAX) 70 MG tablet Take 1 tablet (70 mg total) by mouth every 7 (seven) days. Take with a full glass of water on an empty stomach. (Patient taking differently: Take 70 mg by mouth once a week.) 4 tablet 11   Calcium Carbonate Antacid (TUMS PO) Take 2 tablets by mouth as needed (reflux).     docusate sodium (  COLACE) 250 MG capsule Take 250 mg by mouth daily.     ezetimibe (ZETIA) 10 MG tablet Take 1 tablet (10 mg total) by mouth daily. 90 tablet 3   fluticasone (FLONASE) 50 MCG/ACT nasal spray Place 1 spray into both nostrils as needed for allergies or rhinitis.     iron polysaccharides (NIFEREX) 150 MG capsule Take 1 capsule (150 mg total) by mouth daily. 90 capsule 0   mupirocin ointment (BACTROBAN) 2 % Apply 1 Application topically 2 (two) times daily. 22 g 0   oxyCODONE (OXY IR/ROXICODONE) 5 MG immediate release tablet Take 1-2 tablets (5-10 mg total) by mouth every 4 (four) hours as needed for severe pain or moderate pain (5 mg for moderate pain, 10 mg for severe pain). 30 tablet 0   PARoxetine (PAXIL) 20 MG tablet Take 1 tablet (20 mg total) by  mouth daily. 90 tablet 1   No current facility-administered medications for this visit.    Allergies:   Abilify [aripiprazole], Atorvastatin, and Statins    Social History:  The patient  reports that she has been smoking cigarettes. She has a 30.00 pack-year smoking history. She has never used smokeless tobacco. She reports current alcohol use. She reports that she does not use drugs.   Family History:  The patient's family history includes Breast cancer in her maternal aunt; Cancer in her maternal grandfather and maternal grandmother; Cataracts in her paternal grandmother; Diabetes in her mother; Heart attack in her brother; Heart attack (age of onset: 35) in her father; Heart disease in her father; Hyperlipidemia in her father; Hypertension in her father; Osteoporosis in her maternal grandmother and mother; Other in her paternal grandfather.    ROS:  Please see the history of present illness.   Otherwise, review of systems are positive for none.   All other systems are reviewed and negative.    PHYSICAL EXAM: VS:  BP 100/60 (BP Location: Left Arm, Patient Position: Sitting, Cuff Size: Normal)   Pulse 64   Ht  (1.6 m)   Wt 118 lb 12.8 oz (53.9 kg)   LMP  (LMP Unknown)   SpO2 99%   BMI 21.04 kg/m  , BMI Body mass index is 21.04 kg/m. GENERAL:  Well appearing HEENT:  Pupils equal round and reactive, fundi not visualized, oral mucosa unremarkable NECK:  No jugular venous distention, waveform within normal limits, carotid upstroke brisk and symmetric, no bruits, no thyromegaly LYMPHATICS:  No cervical, inguinal adenopathy LUNGS:  Clear to auscultation bilaterally BACK:  No CVA tenderness CHEST:  Unremarkable HEART:  PMI not displaced or sustained,S1 and S2 within normal limits, no S3, no S4, no clicks, no rubs, very soft brief apical nonradiating systolic murmur, no diastolic murmurs ABD:  Flat, positive bowel sounds normal in frequency in pitch, no bruits, no rebound, no  guarding, no midline pulsatile mass, no hepatomegaly, no splenomegaly, umbilical incision without drainage or erythema EXT:  2 plus pulses throughout, no edema, no cyanosis no clubbing SKIN:  No rashes no nodules NEURO:  Cranial nerves II through XII grossly intact, motor grossly intact throughout PSYCH:  Cognitively intact, oriented to person place and time    EKG:  EKG is ordered today. The ekg ordered today demonstrates sinus rhythm, rate 64, axis within normal limits, intervals within normal limits, no acute ST-T wave changes.   Recent Labs: 08/04/2021: TSH 0.77 07/27/2022: ALT 13; BUN 9; Creatinine, Ser 0.53; Hemoglobin 11.5; Platelets 333.0; Potassium 4.0; Sodium 136    Lipid Panel  Component Value Date/Time   CHOL 184 08/04/2021 1009   TRIG 67.0 08/04/2021 1009   HDL 76.60 08/04/2021 1009   CHOLHDL 2 08/04/2021 1009   VLDL 13.4 08/04/2021 1009   LDLCALC 94 08/04/2021 1009   LDLDIRECT 128.6 12/10/2011 0909      Wt Readings from Last 3 Encounters:  07/29/22 118 lb 12.8 oz (53.9 kg)  07/27/22 120 lb 3.2 oz (54.5 kg)  07/23/22 120 lb (54.4 kg)      Other studies Reviewed: Additional studies/ records that were reviewed today include: ED records. Review of the above records demonstrates:  Please see elsewhere in the note.     ASSESSMENT AND PLAN:  Syncope: I suspect this is related to her acute appendicitis and that postoperatively her presyncope is related to vasovagal events secondary to the discomfort she is having.  They may have resolved themselves.  She was not orthostatic in the office today.  I am going to have her wear a 3-day monitor but I do not suspect any significant bradycardia arrhythmias.  She otherwise has had no cardiac symptoms and minimal cardiovascular risk factors.  Murmur: I suspect she has some mild aortic sclerosis.  I would not otherwise manage and structurally abnormal heart and no further testing is planned.   Current medicines are reviewed  at length with the patient today.  The patient does not have concerns regarding medicines.  The following changes have been made:  no change  Labs/ tests ordered today include:   Orders Placed This Encounter  Procedures   LONG TERM MONITOR (3-14 DAYS)     Disposition:   FU with me as needed based on the results of the above.     Signed, Rollene Rotunda, MD  07/29/2022 12:44 PM    [facility]

## 2022-07-28 NOTE — Telephone Encounter (Signed)
Spoke with patient regarding results/recommendations.  

## 2022-07-28 NOTE — Telephone Encounter (Signed)
Please call patient all of her electrolytes are normal and look good. Her CBC is stable from her hospital stay.  iron levels are significantly low with a level of 14.  Normal is at least above 50-100.      I would recommend she start an iron supplement and I have called this in for her today.  I would again encourage her to take this with food and make sure she is taking a stool softener routinely, since iron supplementation can cause constipation.  Please inform Michelle Arnold if the iron polysaccharide 150 mg is not covered by insurance I would encourage her to look on Amazon for this particular iron product, it is not that expensive out-of-pocket

## 2022-07-29 ENCOUNTER — Ambulatory Visit: Payer: 59 | Attending: Cardiology | Admitting: Cardiology

## 2022-07-29 ENCOUNTER — Encounter: Payer: Self-pay | Admitting: Cardiology

## 2022-07-29 ENCOUNTER — Ambulatory Visit (INDEPENDENT_AMBULATORY_CARE_PROVIDER_SITE_OTHER): Payer: 59

## 2022-07-29 VITALS — BP 100/60 | HR 64 | Ht 63.0 in | Wt 118.8 lb

## 2022-07-29 DIAGNOSIS — R55 Syncope and collapse: Secondary | ICD-10-CM

## 2022-07-29 NOTE — Progress Notes (Unsigned)
Enrolled for [employer] to mail a ZIO XT long term holter monitor to the patients address on file.  

## 2022-07-29 NOTE — Patient Instructions (Addendum)
Medication Instructions:  Your physician recommends that you continue on your current medications as directed. Please refer to the Current Medication list given to you today.  *If you need a refill on your cardiac medications before your next appointment, please call your pharmacy*   Lab Work: None   Testing/Procedures: [place] Monitor Instructions  Your physician has requested you wear a ZIO patch monitor for 3 days.  This is a single patch monitor. [employer] supplies one patch monitor per enrollment. Additional stickers are not available. Please do not apply patch if you will be having a Nuclear Stress Test,  Echocardiogram, Cardiac CT, MRI, or Chest Xray during the period you would be wearing the  monitor. The patch cannot be worn during these tests. You cannot remove and re-apply the  ZIO XT patch monitor.  Your ZIO patch monitor will be mailed 3 day USPS to your address on file. It may take 3-5 days  to receive your monitor after you have been enrolled.  Once you have received your monitor, please review the enclosed instructions. Your monitor  has already been registered assigning a specific monitor serial # to you.  Billing and Patient Assistance Program Information  We have supplied [employer] with any of your insurance information on file for billing purposes. [employer] offers a sliding scale Patient Assistance Program for patients that do not have  insurance, or whose insurance does not completely cover the cost of the ZIO monitor.  You must apply for the Patient Assistance Program to qualify for this discounted rate.  To apply, please call [employer] at [phone], select option 4, select option 2, ask to apply for  Patient Assistance Program. Theodore Demark will ask your household income, and how many people  are in your household. They will quote your out-of-pocket cost based on that information.  [employer] will also be able to set up a 37-month, interest-free payment plan if  needed.  Applying the monitor   Shave hair from upper left chest.  Hold abrader disc by orange tab. Rub abrader in 40 strokes over the upper left chest as  indicated in your monitor instructions.  Clean area with 4 enclosed alcohol pads. Let dry.  Apply patch as indicated in monitor instructions. Patch will be placed under collarbone on left  side of chest with arrow pointing upward.  Rub patch adhesive wings for 2 minutes. Remove white label marked "1". Remove the white  label marked "2". Rub patch adhesive wings for 2 additional minutes.  While looking in a mirror, press and release button in center of patch. A small green light will  flash 3-4 times. This will be your only indicator that the monitor has been turned on.  Do not shower for the first 24 hours. You may shower after the first 24 hours.  Press the button if you feel a symptom. You will hear a small click. Record Date, Time and  Symptom in the Patient Logbook.  When you are ready to remove the patch, follow instructions on the last 2 pages of Patient  Logbook. Stick patch monitor onto the last page of Patient Logbook.  Place Patient Logbook in the blue and white box. Use locking tab on box and tape box closed  securely. The blue and white box has prepaid postage on it. Please place it in the mailbox as  soon as possible. Your physician should have your test results approximately 7 days after the  monitor has been mailed back to [facility].  Call [facility] Customer Care at [phone] if you have questions regarding  your ZIO XT patch monitor. Call them immediately if you see an orange light blinking on your  monitor.  If your monitor falls off in less than 4 days, contact our Monitor department at [phone].  If your monitor becomes loose or falls off after 4 days call [employer] at [phone] for  suggestions on securing your monitor    Follow-Up: At [facility], you and your health needs are  our priority.  As part of our continuing mission to provide you with exceptional heart care, we have created designated Provider Care Teams.  These Care Teams include your primary Cardiologist (physician) and Advanced Practice Providers (APPs -  Physician Assistants and Nurse Practitioners) who all work together to provide you with the care you need, when you need it.  We recommend signing up for the patient portal called "MyChart".  Sign up information is provided on this After Visit Summary.  MyChart is used to connect with patients for Virtual Visits (Telemedicine).  Patients are able to view lab/test results, encounter notes, upcoming appointments, etc.  Non-urgent messages can be sent to your provider as well.   To learn more about what you can do with MyChart, go to [email or web address].    Your next appointment:    As needed  Provider:   Rollene Rotunda, MD

## 2022-07-30 NOTE — Addendum Note (Signed)
Addended by: Jaquil Todt, [place] M on: 07/30/2022 10:03 AM   Modules accepted: Orders

## 2022-08-02 ENCOUNTER — Other Ambulatory Visit: Payer: Self-pay | Admitting: Family Medicine

## 2022-08-03 DIAGNOSIS — R55 Syncope and collapse: Secondary | ICD-10-CM | POA: Diagnosis not present

## 2022-08-06 ENCOUNTER — Encounter: Payer: 59 | Admitting: Family Medicine

## 2022-08-11 ENCOUNTER — Telehealth: Payer: Self-pay

## 2022-08-11 DIAGNOSIS — R55 Syncope and collapse: Secondary | ICD-10-CM | POA: Diagnosis not present

## 2022-08-11 NOTE — Telephone Encounter (Signed)
Michelle Arnold (Michelle Arnold) - [phone] Ferrex 150 150MG  capsules Status: PA Request Created: April 24th, 2024 [phone] Sent: May 8th, 2024

## 2022-08-12 ENCOUNTER — Encounter: Payer: Self-pay | Admitting: Family Medicine

## 2022-08-12 ENCOUNTER — Ambulatory Visit (INDEPENDENT_AMBULATORY_CARE_PROVIDER_SITE_OTHER): Payer: 59 | Admitting: Family Medicine

## 2022-08-12 VITALS — BP 92/60 | HR 85 | Temp 98.1°F | Ht 64.57 in | Wt 113.2 lb

## 2022-08-12 DIAGNOSIS — E559 Vitamin D deficiency, unspecified: Secondary | ICD-10-CM | POA: Diagnosis not present

## 2022-08-12 DIAGNOSIS — E782 Mixed hyperlipidemia: Secondary | ICD-10-CM | POA: Diagnosis not present

## 2022-08-12 DIAGNOSIS — Z131 Encounter for screening for diabetes mellitus: Secondary | ICD-10-CM

## 2022-08-12 DIAGNOSIS — R1900 Intra-abdominal and pelvic swelling, mass and lump, unspecified site: Secondary | ICD-10-CM | POA: Insufficient documentation

## 2022-08-12 DIAGNOSIS — F4321 Adjustment disorder with depressed mood: Secondary | ICD-10-CM

## 2022-08-12 DIAGNOSIS — Z0000 Encounter for general adult medical examination without abnormal findings: Secondary | ICD-10-CM | POA: Diagnosis not present

## 2022-08-12 DIAGNOSIS — M818 Other osteoporosis without current pathological fracture: Secondary | ICD-10-CM | POA: Diagnosis not present

## 2022-08-12 DIAGNOSIS — F418 Other specified anxiety disorders: Secondary | ICD-10-CM | POA: Diagnosis not present

## 2022-08-12 DIAGNOSIS — Z1231 Encounter for screening mammogram for malignant neoplasm of breast: Secondary | ICD-10-CM

## 2022-08-12 DIAGNOSIS — D3502 Benign neoplasm of left adrenal gland: Secondary | ICD-10-CM | POA: Insufficient documentation

## 2022-08-12 DIAGNOSIS — E871 Hypo-osmolality and hyponatremia: Secondary | ICD-10-CM

## 2022-08-12 LAB — TSH: TSH: 0.46 u[IU]/mL (ref 0.35–5.50)

## 2022-08-12 LAB — LIPID PANEL
Cholesterol: 189 mg/dL (ref 0–200)
HDL: 60.2 mg/dL (ref >39.00)
LDL Cholesterol: 106 mg/dL — ABNORMAL HIGH (ref 0–99)
NonHDL: 128.41
Total CHOL/HDL Ratio: 3
Triglycerides: 113 mg/dL (ref 0.0–149.0)
VLDL: 22.6 mg/dL (ref 0.0–40.0)

## 2022-08-12 LAB — HEMOGLOBIN A1C: Hgb A1c MFr Bld: 5.9 % (ref 4.6–6.5)

## 2022-08-12 LAB — VITAMIN D 25 HYDROXY (VIT D DEFICIENCY, FRACTURES): VITD: 25.75 ng/mL — ABNORMAL LOW (ref 30.00–100.00)

## 2022-08-12 MED ORDER — ALENDRONATE SODIUM 70 MG PO TABS: 70.0000 mg | ORAL_TABLET | ORAL | 11 refills | Status: DC

## 2022-08-12 MED ORDER — PAROXETINE HCL 20 MG PO TABS: 20.0000 mg | ORAL_TABLET | Freq: Every day | ORAL | 1 refills | Status: DC

## 2022-08-12 NOTE — Patient Instructions (Addendum)
Return in about 24 weeks (around 01/27/2023) for Routine chronic condition follow-up.        Great to see you today.  I have refilled the medication(s) we provide.   If labs were collected, we will inform you of lab results once received either by echart message or telephone call.   - echart message- for normal results that have been seen by the patient already.   - telephone call: abnormal results or if patient has not viewed results in their echart.

## 2022-08-12 NOTE — Addendum Note (Signed)
Addended by: Filomena Jungling on: 08/12/2022 09:39 AM   Modules accepted: Orders

## 2022-08-12 NOTE — Progress Notes (Signed)
Patient ID: Michelle Arnold, female  DOB: 1965-09-10, 57 y.o.   MRN: [id number] Patient Care Team    Relationship Specialty Notifications Start End  Natalia Leatherwood, DO PCP - General Family Medicine  11/03/15   Rollene Rotunda, MD PCP - Cardiology Cardiology  07/29/22     Chief Complaint  Patient presents with   Annual Exam    Pt is fasting; [facility]    Subjective: Michelle Arnold is a 57 y.o.  Female  present for CPE and routine chronic conditions management All past medical history, surgical history, allergies, family history, immunizations, medications and social history were updated in the electronic medical record today. All recent labs, ED visits and hospitalizations within the last year were reviewed.  Health maintenance:  Colonoscopy: completed 11/29/2013, by Dr. Lucretia Roers, resutls "normal" . follow up  10 years. Mammogram: 2021> ordered [facility] Cervical cancer screening: last pap: 2020, 5 yr. PCP.  Patient has been referred to GYN for left adnexal mass 2024 Immunizations: tdap UTD 08/2021, Influenza (encouraged yearly), PNA 20 completed for smoking history (repeat after 65).  Shingrix competed.  Infectious disease screening: HIV and Hep C screenings completed DEXA: 2021> osteoporosis > [facility] Patient has a Dental home. Hospitalizations/ED visits: Reviewed    Mixed hyperlipidemia Patient reports compliance with zeita. Lipids responded well to medication.    osteoporosis without current pathological fracture Compliant with  fosamax once weekly dose.  Compliant with vitamin D supplementation       04/14/2022    9:43 AM 08/04/2021    9:55 AM 08/04/2021    9:13 AM 12/26/2020    9:48 AM 09/25/2020    9:44 AM  Depression screen PHQ 2/9  Decreased Interest 2 3 0 0 2  Down, Depressed, Hopeless 2 3 0 0 2  PHQ - 2 Score 4 6 0 0 4  Altered sleeping 2 2  0 0  Tired, decreased energy 2 2  0 0  Change in appetite 0 0  0 0  Feeling bad or failure about yourself  2 0  0 0   Trouble concentrating 3 0  0 0  Moving slowly or fidgety/restless 3 0  0 0  Suicidal thoughts 0 0  0 0  PHQ-9 Score 16 10  0 4      08/04/2021    9:55 AM 12/26/2020    9:48 AM 09/25/2020    9:44 AM 06/02/2020   10:31 AM  GAD 7 : Generalized Anxiety Score  Nervous, Anxious, on Edge 3 0 1 1  Control/stop worrying 2 0 1 1  Worry too much - different things 2 0 1 1  Trouble relaxing 2 0 0 1  Restless 0 0 0 1  Easily annoyed or irritable 1 0 0 1  Afraid - awful might happen 1 0 0 1  Total GAD 7 Score 11 0 3 7   Immunization History  Administered Date(s) Administered   Influenza Split 02/04/2012   Influenza Whole 01/04/2011   Influenza,inj,Quad PF,6+ Mos 12/29/2015, 02/02/2017, 01/11/2018, 12/28/2018, 12/20/2019, 12/26/2020   PFIZER(Purple Top)SARS-COV-2 Vaccination 11/08/2019, 11/29/2019   PNEUMOCOCCAL CONJUGATE-20 08/04/2021   Pneumococcal Conjugate-13 06/18/2016   Tdap 11/09/2011, 08/04/2021   Zoster Recombinat (Shingrix) 10/30/2018, 02/01/2019   Past Medical History:  Diagnosis Date   Allergy    Chicken pox as a child   CTS (carpal tunnel syndrome)    right   Depression with anxiety    Dermatitis 11/09/2011   Right arm  Fatigue 11/09/2011   Hidradenitis suppurativa 12/16/2011   Hyperlipidemia    Measles as a child   Migraine 11/09/2011   Mumps as a child   Other abnormal Papanicolaou smear of cervix and cervical HPV(795.09) 2004   required LEEP procedure in 04 but no recurrence   Perimenopausal    Polydipsia 01/11/2018   Tobacco abuse 11/09/2011   Vitamin D deficiency    Allergies  Allergen Reactions   Abilify [Aripiprazole] Other (See Comments)    Suicidal thoughts.    Atorvastatin Other (See Comments)    Memory loss   Statins Other (See Comments)    Memory loss    Past Surgical History:  Procedure Laterality Date   LAPAROSCOPIC APPENDECTOMY N/A 07/23/2022   Procedure: APPENDECTOMY LAPAROSCOPIC;  Surgeon: Griselda Miner, MD;  Location: [facility];  Service:  General;  Laterality: N/A;   LEEP  2004   Family History  Problem Relation Age of Onset   Diabetes Mother        ?   Osteoporosis Mother    Hyperlipidemia Father    Heart disease Father        MI in 7   Hypertension Father    Heart attack Father 71   Heart attack Brother    Cancer Maternal Grandmother        lung- smoker   Osteoporosis Maternal Grandmother    Cancer Maternal Grandfather        prostate   Cataracts Paternal Grandmother    Other Paternal Grandfather        PAD with gangrene   Breast cancer Maternal Aunt    Social History   Social History Narrative   Married. Has children.    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    Everyday smoker. Occasional alcohol. No drugs.     Allergies as of 08/12/2022       Reactions   Abilify [aripiprazole] Other (See Comments)   Suicidal thoughts.    Atorvastatin Other (See Comments)   Memory loss   Statins Other (See Comments)   Memory loss         Medication List        Accurate as of Aug 12, 2022  9:24 AM. If you have any questions, ask your nurse or doctor.          STOP taking these medications    oxyCODONE 5 MG immediate release tablet Commonly known as: Oxy IR/ROXICODONE Stopped by: Felix Pacini, DO       TAKE these medications    alendronate 70 MG tablet Commonly known as: FOSAMAX Take 1 tablet (70 mg total) by mouth every 7 (seven) days. Take with a full glass of water on an empty stomach. What changed:  when to take this additional instructions   docusate sodium 250 MG capsule Commonly known as: COLACE Take 250 mg by mouth daily.   ezetimibe 10 MG tablet Commonly known as: ZETIA Take 1 tablet (10 mg total) by mouth daily.   fluticasone 50 MCG/ACT nasal spray Commonly known as: FLONASE Place 1 spray into both nostrils as needed for allergies or rhinitis.   iron polysaccharides 150 MG capsule Commonly known as: NIFEREX Take 1 capsule (150 mg total) by mouth daily.   mupirocin ointment 2  % Commonly known as: BACTROBAN Apply 1 Application topically 2 (two) times daily.   PARoxetine 20 MG tablet Commonly known as: Paxil Take 1 tablet (20 mg total) by mouth daily.   TUMS PO Take 2 tablets by  mouth as needed (reflux).        All past medical history, surgical history, allergies, family history, immunizations andmedications were updated in the EMR today and reviewed under the history and medication portions of their EMR.       No results found.   ROS 14 pt review of systems performed and negative (unless mentioned in an HPI)  Objective: BP 92/60   Pulse 85   Temp 98.1 F (36.7 C)   Ht 5' 4.57" (1.64 m)   Wt 113 lb 3.2 oz (51.3 kg)   LMP  (LMP Unknown)   SpO2 98%   BMI 19.09 kg/m  Physical Exam Vitals and nursing note reviewed.  Constitutional:      General: She is not in acute distress.    Appearance: Normal appearance. She is not ill-appearing or toxic-appearing.  HENT:     Head: Normocephalic and atraumatic.     Right Ear: Tympanic membrane, ear canal and external ear normal. There is no impacted cerumen.     Left Ear: Tympanic membrane, ear canal and external ear normal. There is no impacted cerumen.     Nose: No congestion or rhinorrhea.     Mouth/Throat:     Mouth: Mucous membranes are moist.     Pharynx: Oropharynx is clear. No oropharyngeal exudate or posterior oropharyngeal erythema.  Eyes:     General: No scleral icterus.       Right eye: No discharge.        Left eye: No discharge.     Extraocular Movements: Extraocular movements intact.     Conjunctiva/sclera: Conjunctivae normal.     Pupils: Pupils are equal, round, and reactive to light.  Cardiovascular:     Rate and Rhythm: Normal rate and regular rhythm.     Pulses: Normal pulses.     Heart sounds: Normal heart sounds. No murmur heard.    No friction rub. No gallop.  Pulmonary:     Effort: Pulmonary effort is normal. No respiratory distress.     Breath sounds: Normal breath  sounds. No stridor. No wheezing, rhonchi or rales.  Chest:     Chest wall: No tenderness.  Abdominal:     General: Abdomen is flat. Bowel sounds are normal. There is no distension.     Palpations: Abdomen is soft. There is no mass.     Tenderness: There is abdominal tenderness (Recent appendectomy). There is no right CVA tenderness, left CVA tenderness, guarding or rebound.     Hernia: No hernia is present.  Musculoskeletal:        General: No swelling, tenderness or deformity. Normal range of motion.     Cervical back: Normal range of motion and neck supple. No rigidity or tenderness.     Right lower leg: No edema.     Left lower leg: No edema.  Lymphadenopathy:     Cervical: No cervical adenopathy.  Skin:    General: Skin is warm and dry.     Coloration: Skin is not jaundiced or pale.     Findings: No bruising, erythema, lesion or rash.  Neurological:     General: No focal deficit present.     Mental Status: She is alert and oriented to person, place, and time. Mental status is at baseline.     Cranial Nerves: No cranial nerve deficit.     Sensory: No sensory deficit.     Motor: No weakness.     Coordination: Coordination normal.     Gait:  Gait normal.     Deep Tendon Reflexes: Reflexes normal.  Psychiatric:        Mood and Affect: Mood normal.        Behavior: Behavior normal.        Thought Content: Thought content normal.        Judgment: Judgment normal.      No results found.  Assessment/plan: JOYIA SALTS is a 57 y.o. female present for CPE and chronic condition management osteoporosis without current pathological fracture Continue fosamax once weekly.  Repeat DEXA due 08/2021> ordered Vitamin D levels collected today.  Continue supplementation   Mixed hyperlipidemia/Allergy to statin medication Stable Continue zeita TSH and lipids collected today.  Reviewed recent CBC and CMP collected last week.  Depression/anxiety: stable Continue  Paxil 20 mg  daily  Routine general medical examination at a health care facility Patient was encouraged to exercise greater than 150 minutes a week. Patient was encouraged to choose a diet filled with fresh fruits and vegetables, and lean meats. AVS provided to patient today for education/recommendation on gender specific health and safety maintenance. Colonoscopy: completed 11/29/2013, by Dr. Lucretia Roers, resutls "normal" . follow up  10 years. Mammogram: 2021> ordered [facility] Cervical cancer screening: last pap: 2020, 5 yr. PCP.  Patient has been referred to GYN for left adnexal mass 2024 Immunizations: tdap UTD 08/2021, Influenza (encouraged yearly), PNA 20 completed for smoking history (repeat after 65).  Shingrix competed.  Infectious disease screening: HIV and Hep C screenings completed DEXA: 2021> osteoporosis > [facility] the [facility]  Return in about 24 weeks (around 01/27/2023) for Routine chronic condition follow-up.   Orders Placed This Encounter  Procedures   MM 3D SCREENING MAMMOGRAM BILATERAL BREAST   DG Bone Density   Hemoglobin A1c   TSH   Vitamin D (25 hydroxy)   Lipid panel   PTH, Intact and Calcium   Meds ordered this encounter  Medications   alendronate (FOSAMAX) 70 MG tablet    Sig: Take 1 tablet (70 mg total) by mouth every 7 (seven) days. Take with a full glass of water on an empty stomach.    Dispense:  4 tablet    Refill:  11   PARoxetine (PAXIL) 20 MG tablet    Sig: Take 1 tablet (20 mg total) by mouth daily.    Dispense:  90 tablet    Refill:  1   Referral Orders  No referral(s) requested today     Electronically signed by: Felix Pacini, DO [facility] [place]

## 2022-08-13 LAB — PTH, INTACT AND CALCIUM
Calcium: 9.7 mg/dL (ref 8.6–10.4)
PTH: 17 pg/mL (ref 16–77)

## 2022-08-16 ENCOUNTER — Other Ambulatory Visit: Payer: Self-pay

## 2022-08-16 ENCOUNTER — Telehealth: Payer: Self-pay | Admitting: Family Medicine

## 2022-08-16 DIAGNOSIS — R1900 Intra-abdominal and pelvic swelling, mass and lump, unspecified site: Secondary | ICD-10-CM

## 2022-08-16 NOTE — Telephone Encounter (Signed)
Patient called to check on referral to OBGYN, it was sent to [facility], however patient would like it sent to [facility] in [place].

## 2022-08-16 NOTE — Telephone Encounter (Signed)
Referral resent to [facility] per pts request.

## 2022-08-16 NOTE — Telephone Encounter (Signed)
Referral routed to [facility] fax [phone] via epic

## 2022-08-16 NOTE — Telephone Encounter (Signed)
Michelle Arnold states they never received the referral. Michelle Arnold provided the fax number as [phone] and ask that she receive another call when its resent to that fax number.

## 2022-08-24 DIAGNOSIS — Z124 Encounter for screening for malignant neoplasm of cervix: Secondary | ICD-10-CM | POA: Diagnosis not present

## 2022-08-24 DIAGNOSIS — Z1151 Encounter for screening for human papillomavirus (HPV): Secondary | ICD-10-CM | POA: Diagnosis not present

## 2022-08-24 DIAGNOSIS — R1900 Intra-abdominal and pelvic swelling, mass and lump, unspecified site: Secondary | ICD-10-CM | POA: Diagnosis not present

## 2022-08-24 DIAGNOSIS — Z01411 Encounter for gynecological examination (general) (routine) with abnormal findings: Secondary | ICD-10-CM | POA: Diagnosis not present

## 2022-08-24 DIAGNOSIS — N9089 Other specified noninflammatory disorders of vulva and perineum: Secondary | ICD-10-CM | POA: Diagnosis not present

## 2022-08-27 ENCOUNTER — Other Ambulatory Visit: Payer: 59

## 2022-08-30 ENCOUNTER — Other Ambulatory Visit (HOSPITAL_COMMUNITY): Payer: Self-pay

## 2022-08-30 NOTE — Telephone Encounter (Signed)
Pharmacy Patient Advocate Encounter  Prior Authorization for Ferrex 150 150MG  capsules has been approved by [employer] (ins).    Effective dates: 08/13/2022 through 08/13/2023   Georga Bora Rx Patient Advocate [phone]

## 2022-08-31 NOTE — Telephone Encounter (Signed)
Noted  

## 2022-09-01 DIAGNOSIS — R1900 Intra-abdominal and pelvic swelling, mass and lump, unspecified site: Secondary | ICD-10-CM | POA: Diagnosis not present

## 2022-09-09 DIAGNOSIS — N2889 Other specified disorders of kidney and ureter: Secondary | ICD-10-CM | POA: Diagnosis not present

## 2022-09-09 DIAGNOSIS — K449 Diaphragmatic hernia without obstruction or gangrene: Secondary | ICD-10-CM | POA: Diagnosis not present

## 2022-09-09 DIAGNOSIS — K760 Fatty (change of) liver, not elsewhere classified: Secondary | ICD-10-CM | POA: Diagnosis not present

## 2022-09-09 DIAGNOSIS — F17200 Nicotine dependence, unspecified, uncomplicated: Secondary | ICD-10-CM | POA: Diagnosis not present

## 2022-09-09 DIAGNOSIS — R7989 Other specified abnormal findings of blood chemistry: Secondary | ICD-10-CM | POA: Diagnosis not present

## 2022-09-09 DIAGNOSIS — Z01818 Encounter for other preprocedural examination: Secondary | ICD-10-CM | POA: Diagnosis not present

## 2022-09-09 DIAGNOSIS — R918 Other nonspecific abnormal finding of lung field: Secondary | ICD-10-CM | POA: Diagnosis not present

## 2022-09-09 DIAGNOSIS — R911 Solitary pulmonary nodule: Secondary | ICD-10-CM | POA: Diagnosis not present

## 2022-09-09 DIAGNOSIS — R1900 Intra-abdominal and pelvic swelling, mass and lump, unspecified site: Secondary | ICD-10-CM | POA: Diagnosis not present

## 2022-09-09 DIAGNOSIS — N3289 Other specified disorders of bladder: Secondary | ICD-10-CM | POA: Diagnosis not present

## 2022-09-09 DIAGNOSIS — J948 Other specified pleural conditions: Secondary | ICD-10-CM | POA: Diagnosis not present

## 2022-09-09 DIAGNOSIS — R590 Localized enlarged lymph nodes: Secondary | ICD-10-CM | POA: Diagnosis not present

## 2022-09-09 DIAGNOSIS — K7689 Other specified diseases of liver: Secondary | ICD-10-CM | POA: Diagnosis not present

## 2022-09-15 DIAGNOSIS — Z8042 Family history of malignant neoplasm of prostate: Secondary | ICD-10-CM | POA: Diagnosis not present

## 2022-09-15 DIAGNOSIS — F419 Anxiety disorder, unspecified: Secondary | ICD-10-CM | POA: Diagnosis not present

## 2022-09-15 DIAGNOSIS — Z8249 Family history of ischemic heart disease and other diseases of the circulatory system: Secondary | ICD-10-CM | POA: Diagnosis not present

## 2022-09-15 DIAGNOSIS — F17210 Nicotine dependence, cigarettes, uncomplicated: Secondary | ICD-10-CM | POA: Diagnosis not present

## 2022-09-15 DIAGNOSIS — Z833 Family history of diabetes mellitus: Secondary | ICD-10-CM | POA: Diagnosis not present

## 2022-09-15 DIAGNOSIS — E785 Hyperlipidemia, unspecified: Secondary | ICD-10-CM | POA: Diagnosis not present

## 2022-09-15 DIAGNOSIS — Z79899 Other long term (current) drug therapy: Secondary | ICD-10-CM | POA: Diagnosis not present

## 2022-09-15 DIAGNOSIS — Z800 Family history of malignant neoplasm of digestive organs: Secondary | ICD-10-CM | POA: Diagnosis not present

## 2022-09-15 DIAGNOSIS — Z8489 Family history of other specified conditions: Secondary | ICD-10-CM | POA: Diagnosis not present

## 2022-09-15 DIAGNOSIS — F329 Major depressive disorder, single episode, unspecified: Secondary | ICD-10-CM | POA: Diagnosis not present

## 2022-09-15 DIAGNOSIS — Z8262 Family history of osteoporosis: Secondary | ICD-10-CM | POA: Diagnosis not present

## 2022-09-15 DIAGNOSIS — C562 Malignant neoplasm of left ovary: Secondary | ICD-10-CM | POA: Diagnosis not present

## 2022-09-15 DIAGNOSIS — Z7983 Long term (current) use of bisphosphonates: Secondary | ICD-10-CM | POA: Diagnosis not present

## 2022-09-15 DIAGNOSIS — Z801 Family history of malignant neoplasm of trachea, bronchus and lung: Secondary | ICD-10-CM | POA: Diagnosis not present

## 2022-09-15 HISTORY — PX: RADICAL ABDOMINAL HYSTERECTOMY: SUR659

## 2022-09-16 DIAGNOSIS — D259 Leiomyoma of uterus, unspecified: Secondary | ICD-10-CM | POA: Diagnosis not present

## 2022-09-16 DIAGNOSIS — F329 Major depressive disorder, single episode, unspecified: Secondary | ICD-10-CM | POA: Diagnosis not present

## 2022-09-16 DIAGNOSIS — Z8042 Family history of malignant neoplasm of prostate: Secondary | ICD-10-CM | POA: Diagnosis not present

## 2022-09-16 DIAGNOSIS — Z801 Family history of malignant neoplasm of trachea, bronchus and lung: Secondary | ICD-10-CM | POA: Diagnosis not present

## 2022-09-16 DIAGNOSIS — Z8489 Family history of other specified conditions: Secondary | ICD-10-CM | POA: Diagnosis not present

## 2022-09-16 DIAGNOSIS — Z8249 Family history of ischemic heart disease and other diseases of the circulatory system: Secondary | ICD-10-CM | POA: Diagnosis not present

## 2022-09-16 DIAGNOSIS — Z79899 Other long term (current) drug therapy: Secondary | ICD-10-CM | POA: Diagnosis not present

## 2022-09-16 DIAGNOSIS — C786 Secondary malignant neoplasm of retroperitoneum and peritoneum: Secondary | ICD-10-CM | POA: Diagnosis not present

## 2022-09-16 DIAGNOSIS — N9489 Other specified conditions associated with female genital organs and menstrual cycle: Secondary | ICD-10-CM | POA: Diagnosis not present

## 2022-09-16 DIAGNOSIS — Z833 Family history of diabetes mellitus: Secondary | ICD-10-CM | POA: Diagnosis not present

## 2022-09-16 DIAGNOSIS — C562 Malignant neoplasm of left ovary: Secondary | ICD-10-CM | POA: Diagnosis not present

## 2022-09-16 DIAGNOSIS — F17210 Nicotine dependence, cigarettes, uncomplicated: Secondary | ICD-10-CM | POA: Diagnosis not present

## 2022-09-16 DIAGNOSIS — R1900 Intra-abdominal and pelvic swelling, mass and lump, unspecified site: Secondary | ICD-10-CM | POA: Diagnosis not present

## 2022-09-16 DIAGNOSIS — E785 Hyperlipidemia, unspecified: Secondary | ICD-10-CM | POA: Diagnosis not present

## 2022-09-16 DIAGNOSIS — F419 Anxiety disorder, unspecified: Secondary | ICD-10-CM | POA: Diagnosis not present

## 2022-09-16 DIAGNOSIS — Z8262 Family history of osteoporosis: Secondary | ICD-10-CM | POA: Diagnosis not present

## 2022-09-16 DIAGNOSIS — Z800 Family history of malignant neoplasm of digestive organs: Secondary | ICD-10-CM | POA: Diagnosis not present

## 2022-09-16 DIAGNOSIS — Z7983 Long term (current) use of bisphosphonates: Secondary | ICD-10-CM | POA: Diagnosis not present

## 2022-09-17 DIAGNOSIS — R1900 Intra-abdominal and pelvic swelling, mass and lump, unspecified site: Secondary | ICD-10-CM | POA: Diagnosis not present

## 2022-09-17 DIAGNOSIS — F329 Major depressive disorder, single episode, unspecified: Secondary | ICD-10-CM | POA: Diagnosis not present

## 2022-09-17 DIAGNOSIS — F17210 Nicotine dependence, cigarettes, uncomplicated: Secondary | ICD-10-CM | POA: Diagnosis not present

## 2022-09-17 DIAGNOSIS — Z8262 Family history of osteoporosis: Secondary | ICD-10-CM | POA: Diagnosis not present

## 2022-09-17 DIAGNOSIS — Z8042 Family history of malignant neoplasm of prostate: Secondary | ICD-10-CM | POA: Diagnosis not present

## 2022-09-17 DIAGNOSIS — E785 Hyperlipidemia, unspecified: Secondary | ICD-10-CM | POA: Diagnosis not present

## 2022-09-17 DIAGNOSIS — Z801 Family history of malignant neoplasm of trachea, bronchus and lung: Secondary | ICD-10-CM | POA: Diagnosis not present

## 2022-09-17 DIAGNOSIS — Z8489 Family history of other specified conditions: Secondary | ICD-10-CM | POA: Diagnosis not present

## 2022-09-17 DIAGNOSIS — C562 Malignant neoplasm of left ovary: Secondary | ICD-10-CM | POA: Diagnosis not present

## 2022-09-17 DIAGNOSIS — Z8249 Family history of ischemic heart disease and other diseases of the circulatory system: Secondary | ICD-10-CM | POA: Diagnosis not present

## 2022-09-17 DIAGNOSIS — F419 Anxiety disorder, unspecified: Secondary | ICD-10-CM | POA: Diagnosis not present

## 2022-09-17 DIAGNOSIS — Z79899 Other long term (current) drug therapy: Secondary | ICD-10-CM | POA: Diagnosis not present

## 2022-09-17 DIAGNOSIS — Z800 Family history of malignant neoplasm of digestive organs: Secondary | ICD-10-CM | POA: Diagnosis not present

## 2022-09-17 DIAGNOSIS — Z833 Family history of diabetes mellitus: Secondary | ICD-10-CM | POA: Diagnosis not present

## 2022-09-17 DIAGNOSIS — Z7983 Long term (current) use of bisphosphonates: Secondary | ICD-10-CM | POA: Diagnosis not present

## 2022-09-18 DIAGNOSIS — Z79899 Other long term (current) drug therapy: Secondary | ICD-10-CM | POA: Diagnosis not present

## 2022-09-18 DIAGNOSIS — F419 Anxiety disorder, unspecified: Secondary | ICD-10-CM | POA: Diagnosis not present

## 2022-09-18 DIAGNOSIS — Z833 Family history of diabetes mellitus: Secondary | ICD-10-CM | POA: Diagnosis not present

## 2022-09-18 DIAGNOSIS — E785 Hyperlipidemia, unspecified: Secondary | ICD-10-CM | POA: Diagnosis not present

## 2022-09-18 DIAGNOSIS — R1900 Intra-abdominal and pelvic swelling, mass and lump, unspecified site: Secondary | ICD-10-CM | POA: Diagnosis not present

## 2022-09-18 DIAGNOSIS — Z8042 Family history of malignant neoplasm of prostate: Secondary | ICD-10-CM | POA: Diagnosis not present

## 2022-09-18 DIAGNOSIS — Z7983 Long term (current) use of bisphosphonates: Secondary | ICD-10-CM | POA: Diagnosis not present

## 2022-09-18 DIAGNOSIS — F329 Major depressive disorder, single episode, unspecified: Secondary | ICD-10-CM | POA: Diagnosis not present

## 2022-09-18 DIAGNOSIS — Z800 Family history of malignant neoplasm of digestive organs: Secondary | ICD-10-CM | POA: Diagnosis not present

## 2022-09-18 DIAGNOSIS — Z8249 Family history of ischemic heart disease and other diseases of the circulatory system: Secondary | ICD-10-CM | POA: Diagnosis not present

## 2022-09-18 DIAGNOSIS — Z8489 Family history of other specified conditions: Secondary | ICD-10-CM | POA: Diagnosis not present

## 2022-09-18 DIAGNOSIS — Z8262 Family history of osteoporosis: Secondary | ICD-10-CM | POA: Diagnosis not present

## 2022-09-18 DIAGNOSIS — Z801 Family history of malignant neoplasm of trachea, bronchus and lung: Secondary | ICD-10-CM | POA: Diagnosis not present

## 2022-09-18 DIAGNOSIS — C562 Malignant neoplasm of left ovary: Secondary | ICD-10-CM | POA: Diagnosis not present

## 2022-09-18 DIAGNOSIS — F17210 Nicotine dependence, cigarettes, uncomplicated: Secondary | ICD-10-CM | POA: Diagnosis not present

## 2022-09-20 ENCOUNTER — Telehealth: Payer: Self-pay

## 2022-09-20 NOTE — Transitions of Care (Post Inpatient/ED Visit) (Signed)
   09/20/2022  Name: CARRIANNE CUTTING MRN: [id number] DOB: [birth year]  Today's TOC FU Call Status: Today's TOC FU Call Status:: Unsuccessul Call (1st Attempt) Unsuccessful Call (1st Attempt) Date: 09/20/22  Attempted to reach the patient regarding the most recent Inpatient/ED visit.  Follow Up Plan: Additional outreach attempts will be made to reach the patient to complete the Transitions of Care (Post Inpatient/ED visit) call.     Antionette Fairy, RN,BSN,CCM [facility] Care Management Care Management Community Coordinator Direct Phone: [phone] Toll Free: [phone] Fax: [phone]

## 2022-09-21 ENCOUNTER — Telehealth: Payer: Self-pay

## 2022-09-21 NOTE — Transitions of Care (Post Inpatient/ED Visit) (Addendum)
09/21/2022  Name: Michelle Arnold MRN: [id number] DOB: 02/02/1966  Today's TOC FU Call Status: Today's TOC FU Call Status:: Successful TOC FU Call Competed TOC FU Call Complete Date: 09/21/22  Transition Care Management Follow-up Telephone Call Date of Discharge: 09/18/22 Discharge Facility: Other ([facility]) Name of Other ([facility]) Discharge Facility: [facility] Type of Discharge: Inpatient Admission Primary Inpatient Discharge Diagnosis:: "pelvic mass" How have you been since you were released from the hospital?: Better (Pt reports she is "sore but doing okay." Current pain 4/10-last took Tylenol at 4am-states she is trying not to take Oxycodone unless she really needs it. LBM yest. Appetite fair.) Any questions or concerns?: No  Items Reviewed: Did you receive and understand the discharge instructions provided?: Yes Medications obtained,verified, and reconciled?: Yes (Medications Reviewed) Any new allergies since your discharge?: No Dietary orders reviewed?: Yes Type of Diet Ordered:: low salt/heart healthy Do you have support at home?: Yes People in Home: spouse Name of Support/Comfort Primary Source: Jerold  Medications Reviewed Today: Medications Reviewed Today     Reviewed by Charlyn Minerva, RN (Registered Nurse) on 09/21/22 at 1010  Med List Status: <None>   Medication Order Taking? Sig Documenting Provider Last Dose Status Informant  acetaminophen (TYLENOL) 500 MG tablet [id number] Yes Take 1,000 mg by mouth 3 (three) times daily as needed for mild pain. [provider] Taking Active Self  alendronate (FOSAMAX) 70 MG tablet [id number] Yes Take 1 tablet (70 mg total) by mouth every 7 (seven) days. Take with a full glass of water on an empty stomach. Kuneff, Renee A, DO Taking Active   apixaban (ELIQUIS) 2.5 MG TABS tablet [id number] Yes Take 2.5 mg by mouth 2 (two) times daily. [provider] Taking Active Self  Calcium  Carbonate Antacid (TUMS PO) [id number] Yes Take 2 tablets by mouth as needed (reflux). [provider] Taking Active Self           Med Note (CRUTHIS, CHLOE C   Fri Jul 23, 2022  9:03 AM) Pt is unsure of last dose.   Cholecalciferol (VITAMIN D-3) 125 MCG (5000 UT) TABS [id number] Yes Take 5,000 Units by mouth daily. [provider] Taking Active Self  docusate sodium (COLACE) 250 MG capsule [id number] Yes Take 250 mg by mouth daily. [provider] Taking Active   ezetimibe (ZETIA) 10 MG tablet [id number] Yes Take 1 tablet (10 mg total) by mouth daily. Kuneff, Renee A, DO Taking Active   fluticasone (FLONASE) 50 MCG/ACT nasal spray [id number] Yes Place 1 spray into both nostrils as needed for allergies or rhinitis. [provider] Taking Active Self  Fluticasone Propionate, Inhal, (FLOVENT DISKUS) 50 MCG/ACT AEPB [id number] Yes Inhale 1 puff into the lungs daily. [provider] Taking Active Self  gabapentin (NEURONTIN) 300 MG capsule [id number] Yes Take 300 mg by mouth at bedtime. [provider] Taking Active Self  iron polysaccharides (NIFEREX) 150 MG capsule [id number] Yes Take 1 capsule (150 mg total) by mouth daily. Kuneff, Renee A, DO Taking Active   magnesium oxide (MAG-OX) 400 (240 Mg) MG tablet [id number] Yes Take 400 mg by mouth daily. [provider] Taking Active Self  methocarbamol (ROBAXIN) 750 MG tablet [id number] Yes Take 750 mg by mouth 3 (three) times daily as needed for muscle spasms.  3 (three) times a day as needed for muscle spasms for up to 10 days. [provider] Taking Active Self  multivitamin-iron-minerals-folic acid (THERAPEUTIC-M) TABS tablet [id number] Yes Take 1 tablet by mouth  daily. [provider] Taking Active Self  mupirocin ointment (BACTROBAN) 2 % [id number] Yes Apply 1 Application topically 2 (two) times daily. Kuneff, Renee A, DO Taking Active   ondansetron (ZOFRAN) 4 MG tablet [id number] Yes  Take 4 mg by mouth every 6 (six) hours as needed for nausea or vomiting. [provider] Taking Active Self  PARoxetine (PAXIL) 20 MG tablet [id number] Yes Take 1 tablet (20 mg total) by mouth daily. Kuneff, Renee A, DO Taking Active   pseudoephedrine-guaifenesin (MUCINEX D) 60-600 MG 12 hr tablet [id number] Yes Take 1 tablet by mouth every 12 (twelve) hours as needed for congestion. [provider] Taking Active Self  rosuvastatin (CRESTOR) 10 MG tablet [id number] Yes Take 10 mg by mouth daily. [provider] Taking Active Self  senna (SENOKOT) 8.6 MG tablet [id number] Yes Take 1 tablet by mouth at bedtime. [provider] Taking Active Self  simethicone (MYLICON) 80 MG chewable tablet [id number] Yes Chew 80 mg by mouth every 6 (six) hours as needed for flatulence. [provider] Taking Active Self            Home Care and Equipment/Supplies: Were Home Health Services Ordered?: NA Any new equipment or medical supplies ordered?: NA  Functional Questionnaire: Do you need assistance with bathing/showering or dressing?: Yes Do you need assistance with meal preparation?: Yes Do you need assistance with eating?: No Do you have difficulty maintaining continence: Yes (urinary incontinence) Do you need assistance with getting out of bed/getting out of a chair/moving?: Yes Do you have difficulty managing or taking your medications?: No  Follow up appointments reviewed: PCP Follow-up appointment confirmed?: Yes Date of PCP follow-up appointment?: 10/05/22 (pt requested PCP appt after specialist appt) Follow-up Provider: Dr. Claiborne Billings Specialist [facility] Follow-up appointment confirmed?: Yes Date of Specialist follow-up appointment?: 09/30/22 Follow-Up Specialty Provider:: Dr. Rosey Bath Do you need transportation to your follow-up appointment?: No Do you understand care options if your condition(s) worsen?: Yes-patient verbalized understanding  SDOH  Interventions Today    Flowsheet Row Most Recent Value  SDOH Interventions   Food Insecurity Interventions Intervention Not Indicated  Transportation Interventions Intervention Not Indicated       TOC Interventions Today    Flowsheet Row Most Recent Value  TOC Interventions   TOC Interventions Discussed/Reviewed TOC Interventions Discussed, Arranged PCP follow up less than 12 days/Care Guide scheduled, Post op wound/incision care, S/S of infection, Post discharge activity limitations per provider      Interventions Today    Flowsheet Row Most Recent Value  General Interventions   General Interventions Discussed/Reviewed General Interventions Discussed, Doctor Visits  Doctor Visits Discussed/Reviewed Doctor Visits Discussed, PCP, Specialist  PCP/Specialist Visits Compliance with follow-up visit  Education Interventions   Education Provided Provided Education  Provided Verbal Education On Nutrition, When to see the doctor, Medication, Other  [pain mgmt/bowel regimen]  Nutrition Interventions   Nutrition Discussed/Reviewed Nutrition Discussed, Adding fruits and vegetables, Decreasing salt  Pharmacy Interventions   Pharmacy Dicussed/Reviewed Pharmacy Topics Discussed, Medications and their functions  Safety Interventions   Safety Discussed/Reviewed Safety Discussed        Alessandra Grout [facility] Care Management Care Management Community Coordinator Direct Phone: [phone] Toll Free: [phone] Fax: [phone]

## 2022-09-30 DIAGNOSIS — F17200 Nicotine dependence, unspecified, uncomplicated: Secondary | ICD-10-CM | POA: Diagnosis not present

## 2022-09-30 DIAGNOSIS — F411 Generalized anxiety disorder: Secondary | ICD-10-CM | POA: Diagnosis not present

## 2022-09-30 DIAGNOSIS — C562 Malignant neoplasm of left ovary: Secondary | ICD-10-CM | POA: Diagnosis not present

## 2022-09-30 DIAGNOSIS — F331 Major depressive disorder, recurrent, moderate: Secondary | ICD-10-CM | POA: Diagnosis not present

## 2022-10-05 ENCOUNTER — Encounter: Payer: Self-pay | Admitting: Family Medicine

## 2022-10-05 ENCOUNTER — Ambulatory Visit (INDEPENDENT_AMBULATORY_CARE_PROVIDER_SITE_OTHER): Payer: 59 | Admitting: Family Medicine

## 2022-10-05 VITALS — BP 105/69 | HR 88 | Temp 98.2°F | Wt 110.0 lb

## 2022-10-05 DIAGNOSIS — C562 Malignant neoplasm of left ovary: Secondary | ICD-10-CM

## 2022-10-05 DIAGNOSIS — D84821 Immunodeficiency due to drugs: Secondary | ICD-10-CM

## 2022-10-05 DIAGNOSIS — Z79899 Other long term (current) drug therapy: Secondary | ICD-10-CM | POA: Diagnosis not present

## 2022-10-05 NOTE — Progress Notes (Signed)
Michelle Arnold , 04-Jun-1965, 57 y.o., female MRN: [id number] Patient Care Team    Relationship Specialty Notifications Start End  Natalia Leatherwood, DO PCP - General Family Medicine  11/03/15   Rollene Rotunda, MD PCP - Cardiology Cardiology  07/29/22     Chief Complaint  Patient presents with   Update on current medical conditions    States she will have to have cancer treatments; believes this appt is just to keep PCP updated     Subjective:  Michelle Arnold  is a 57 y.o. female presents for scheduled follow-up after admission 09/15/2022 for adnexal mass.  Patient was discharged on 09/18/2022.  She underwent a total laparoscopic hysterectomy with bilateral salpingectomy and oophorectomy that was converted to a laparotomy with omentectomy bilateral pelvic and para-aortic lymph node dissection and peritoneal biopsies.  I OFS consistent with malignant carcinoma.  Patient has followed up with her oncologist and surgical team.  They plan to start chemo on 10/28/2022.  They plan for 6 rounds of chemo. Patient reports since being home she is recovering well.  She still mildly sore but overall tolerating pain.  She is eating and drinking her normal and having normal bowel movements.  She denies any fevers or chills.  She denies any cough.  No results for input(s): "HGB", "HCT", "WBC", "PLT" in the last 168 hours.    Latest Ref Rng & Units 08/12/2022    9:45 AM 07/27/2022   12:24 PM 07/23/2022    3:44 AM  CMP  Glucose 70 - 99 mg/dL  83  621   BUN 6 - 23 mg/dL  9  7   Creatinine 3.08 - 1.20 mg/dL  6.57  8.46   Sodium 962 - 145 mEq/L  136  132   Potassium 3.5 - 5.1 mEq/L  4.0  3.1   Chloride 96 - 112 mEq/L  97  102   CO2 19 - 32 mEq/L  28  22   Calcium 8.6 - 10.4 mg/dL 9.7  8.8  8.0   Total Protein 6.0 - 8.3 g/dL  6.0    Total Bilirubin 0.2 - 1.2 mg/dL  0.4    Alkaline Phos 39 - 117 U/L  53    AST 0 - 37 U/L  16    ALT 0 - 35 U/L  13        No results found.      04/14/2022    9:43 AM  08/04/2021    9:55 AM 08/04/2021    9:13 AM 12/26/2020    9:48 AM 09/25/2020    9:44 AM  Depression screen PHQ 2/9  Decreased Interest 2 3 0 0 2  Down, Depressed, Hopeless 2 3 0 0 2  PHQ - 2 Score 4 6 0 0 4  Altered sleeping 2 2  0 0  Tired, decreased energy 2 2  0 0  Change in appetite 0 0  0 0  Feeling bad or failure about yourself  2 0  0 0  Trouble concentrating 3 0  0 0  Moving slowly or fidgety/restless 3 0  0 0  Suicidal thoughts 0 0  0 0  PHQ-9 Score 16 10  0 4    Allergies  Allergen Reactions   Abilify [Aripiprazole] Other (See Comments)    Suicidal thoughts.    Atorvastatin Other (See Comments)    Memory loss   Statins Other (See Comments)    Memory loss  Social History   Tobacco Use   Smoking status: Every Day    Packs/day: 1.00    Years: 30.00    Additional pack years: 0.00    Total pack years: 30.00    Types: Cigarettes   Smokeless tobacco: Never  Substance Use Topics   Alcohol use: Yes    Comment: very rare   Past Medical History:  Diagnosis Date   Allergy    Chicken pox as a child   CTS (carpal tunnel syndrome)    right   Depression with anxiety    Dermatitis 11/09/2011   Right arm   Fatigue 11/09/2011   Grief at loss of child 04/11/2020   Hidradenitis suppurativa 12/16/2011   Hyperlipidemia    Measles as a child   Migraine 11/09/2011   Mumps as a child   Other abnormal Papanicolaou smear of cervix and cervical HPV(795.09) 2004   required LEEP procedure in 04 but no recurrence   Perimenopausal    Polydipsia 01/11/2018   Tobacco abuse 11/09/2011   Vitamin D deficiency    Past Surgical History:  Procedure Laterality Date   LAPAROSCOPIC APPENDECTOMY N/A 07/23/2022   Procedure: APPENDECTOMY LAPAROSCOPIC;  Surgeon: Griselda Miner, MD;  Location: [facility];  Service: General;  Laterality: N/A;   LEEP  04/05/2002   RADICAL ABDOMINAL HYSTERECTOMY  09/15/2022   Total hysterectomy/BSO and bilateral pelvic and para-aortic lymph node dissection and  peritoneal biopsies.-Left ovarian cancer   Family History  Problem Relation Age of Onset   Diabetes Mother        ?   Osteoporosis Mother    Hyperlipidemia Father    Heart disease Father        MI in 59   Hypertension Father    Heart attack Father 43   Heart attack Brother    Cancer Maternal Grandmother        lung- smoker   Osteoporosis Maternal Grandmother    Cancer Maternal Grandfather        prostate   Cataracts Paternal Grandmother    Other Paternal Grandfather        PAD with gangrene   Breast cancer Maternal Aunt    Allergies as of 10/05/2022       Reactions   Abilify [aripiprazole] Other (See Comments)   Suicidal thoughts.    Atorvastatin Other (See Comments)   Memory loss   Statins Other (See Comments)   Memory loss         Medication List        Accurate as of October 05, 2022 11:59 PM. If you have any questions, ask your nurse or doctor.          STOP taking these medications    acetaminophen 500 MG tablet Commonly known as: TYLENOL Stopped by: Felix Pacini, DO   iron polysaccharides 150 MG capsule Commonly known as: NIFEREX Stopped by: Felix Pacini, DO   methocarbamol 750 MG tablet Commonly known as: ROBAXIN Stopped by: Felix Pacini, DO   mupirocin ointment 2 % Commonly known as: BACTROBAN Stopped by: Felix Pacini, DO   ondansetron 4 MG tablet Commonly known as: ZOFRAN Stopped by: Felix Pacini, DO   oxyCODONE 5 MG immediate release tablet Commonly known as: Oxy IR/ROXICODONE Stopped by: Felix Pacini, DO   pseudoephedrine-guaifenesin 60-600 MG 12 hr tablet Commonly known as: MUCINEX D Stopped by: Felix Pacini, DO       TAKE these medications    alendronate 70 MG tablet Commonly known as: FOSAMAX Take  1 tablet (70 mg total) by mouth every 7 (seven) days. Take with a full glass of water on an empty stomach.   dexamethasone 4 MG tablet Commonly known as: DECADRON Take by mouth. Start taking on: October 27, 2022   docusate  sodium 250 MG capsule Commonly known as: COLACE Take 250 mg by mouth daily.   Eliquis 2.5 MG Tabs tablet Generic drug: apixaban Take 2.5 mg by mouth 2 (two) times daily.   ezetimibe 10 MG tablet Commonly known as: ZETIA Take 1 tablet (10 mg total) by mouth daily.   Flovent Diskus 50 MCG/ACT Aepb Generic drug: Fluticasone Propionate (Inhal) Inhale 1 puff into the lungs daily.   fluticasone 50 MCG/ACT nasal spray Commonly known as: FLONASE Place 1 spray into both nostrils as needed for allergies or rhinitis.   gabapentin 300 MG capsule Commonly known as: NEURONTIN Take 300 mg by mouth at bedtime.   magnesium oxide 400 (240 Mg) MG tablet Commonly known as: MAG-OX Take 400 mg by mouth daily.   multivitamin-iron-minerals-folic acid Tabs tablet Take 1 tablet by mouth daily.   PARoxetine 20 MG tablet Commonly known as: Paxil Take 1 tablet (20 mg total) by mouth daily.   promethazine 25 MG tablet Commonly known as: PHENERGAN Take by mouth. Start taking on: October 27, 2022   rosuvastatin 10 MG tablet Commonly known as: CRESTOR Take 10 mg by mouth daily.   senna 8.6 MG tablet Commonly known as: SENOKOT Take 1 tablet by mouth at bedtime.   simethicone 80 MG chewable tablet Commonly known as: MYLICON Chew 80 mg by mouth every 6 (six) hours as needed for flatulence.   TUMS PO Take 2 tablets by mouth as needed (reflux).   Vitamin D-3 125 MCG (5000 UT) Tabs Take 5,000 Units by mouth daily.        All past medical history, surgical history, allergies, family history, immunizations and medications were updated in the EMR today and reviewed under the history and medication portions of their EMR.      ROS: Negative, with the exception of above mentioned in HPI   Objective:  BP 105/69   Pulse 88   Temp 98.2 F (36.8 C)   Wt 110 lb (49.9 kg)   LMP  (LMP Unknown)   SpO2 97%   BMI 18.55 kg/m  Body mass index is 18.55 kg/m. Physical Exam Vitals and nursing note  reviewed.  Constitutional:      General: She is not in acute distress.    Appearance: Normal appearance. She is not ill-appearing, toxic-appearing or diaphoretic.  HENT:     Head: Normocephalic and atraumatic.  Eyes:     General: No scleral icterus.       Right eye: No discharge.        Left eye: No discharge.     Extraocular Movements: Extraocular movements intact.     Conjunctiva/sclera: Conjunctivae normal.     Pupils: Pupils are equal, round, and reactive to light.  Cardiovascular:     Rate and Rhythm: Normal rate and regular rhythm.  Pulmonary:     Effort: Pulmonary effort is normal. No respiratory distress.     Breath sounds: Normal breath sounds. No wheezing, rhonchi or rales.  Abdominal:     General: There is distension (Appropriate postsurgical).     Palpations: Abdomen is soft. There is no mass.     Tenderness: There is abdominal tenderness (Appropriate postsurgical). There is no right CVA tenderness, left CVA tenderness, guarding or rebound.  Hernia: No hernia is present.  Musculoskeletal:     Right lower leg: No edema.     Left lower leg: No edema.  Skin:    General: Skin is warm.     Findings: No rash.  Neurological:     Mental Status: She is alert and oriented to person, place, and time. Mental status is at baseline.     Motor: No weakness.     Gait: Gait normal.  Psychiatric:        Mood and Affect: Mood normal.        Behavior: Behavior normal.        Thought Content: Thought content normal.        Judgment: Judgment normal.      Assessment/Plan: Michelle Arnold is a 57 y.o. female present for OV for Hospital discharge follow up Ovarian cancer on left (HCC)/immunocompromised Recovering well s/p abdominal hysterectomy BSO with omentectomy bilateral pelvic and periaortic lymph node dissection and peritoneal biopsies for left ovarian cancer Has had follow-up with oncology and understands diagnosis and further treatment plan with chemo time 6 rounds to start  10/28/2022. She has questions surrounding when her PICC line will be placed.  Encouraged her to call her oncology team since she has not heard from them surrounding scheduling. We discussed mental health surrounding her ovarian cancer diagnosis and she states she feels she is adjusting okay and having appropriate emotions.  She does not feel she needs an increase in her medications today. Continue routine follow-up schedule   Reviewed expectations re: course of current medical issues. Discussed self-management of symptoms. Outlined signs and symptoms indicating need for more acute intervention. Patient verbalized understanding and all questions were answered. Patient received an After-Visit Summary. Any changes in medications were reviewed and patient was provided with updated med list with their AVS.     No orders of the defined types were placed in this encounter.    Note is dictated utilizing voice recognition software. Although note has been proof read prior to signing, occasional typographical errors still can be missed. If any questions arise, please do not hesitate to call for verification.   electronically signed by:  Felix Pacini, DO  [facility] [place]

## 2022-10-05 NOTE — Patient Instructions (Signed)
Return if symptoms worsen or fail to improve.        Great to see you today.    

## 2022-10-08 ENCOUNTER — Encounter: Payer: Self-pay | Admitting: Family Medicine

## 2022-10-08 DIAGNOSIS — Z79899 Other long term (current) drug therapy: Secondary | ICD-10-CM | POA: Insufficient documentation

## 2022-10-08 DIAGNOSIS — C562 Malignant neoplasm of left ovary: Secondary | ICD-10-CM | POA: Insufficient documentation

## 2022-10-25 DIAGNOSIS — F17210 Nicotine dependence, cigarettes, uncomplicated: Secondary | ICD-10-CM | POA: Diagnosis not present

## 2022-10-25 DIAGNOSIS — C562 Malignant neoplasm of left ovary: Secondary | ICD-10-CM | POA: Diagnosis not present

## 2022-10-28 DIAGNOSIS — G608 Other hereditary and idiopathic neuropathies: Secondary | ICD-10-CM | POA: Diagnosis not present

## 2022-10-28 DIAGNOSIS — Z5111 Encounter for antineoplastic chemotherapy: Secondary | ICD-10-CM | POA: Diagnosis not present

## 2022-10-28 DIAGNOSIS — F331 Major depressive disorder, recurrent, moderate: Secondary | ICD-10-CM | POA: Diagnosis not present

## 2022-10-28 DIAGNOSIS — C562 Malignant neoplasm of left ovary: Secondary | ICD-10-CM | POA: Diagnosis not present

## 2022-10-28 DIAGNOSIS — F17200 Nicotine dependence, unspecified, uncomplicated: Secondary | ICD-10-CM | POA: Diagnosis not present

## 2022-10-28 DIAGNOSIS — C569 Malignant neoplasm of unspecified ovary: Secondary | ICD-10-CM | POA: Diagnosis not present

## 2022-11-01 ENCOUNTER — Telehealth: Payer: Self-pay | Admitting: Family Medicine

## 2022-11-01 NOTE — Telephone Encounter (Signed)
Pt is to call back after discussing concern with mother. Mother was advised to have pt call to schedule

## 2022-11-01 NOTE — Telephone Encounter (Signed)
Please contact Michelle Arnold, the patient and ask her if this is her desire to discuss her depression and anxiety medications.  Please make her aware that we are asking, because received a phone call from her mother.   If so, then please schedule her an appointment in office.

## 2022-11-01 NOTE — Telephone Encounter (Signed)
Zetia is not a medication for stress and anxiety. Please have pt schedule appt.

## 2022-11-01 NOTE — Telephone Encounter (Signed)
LM for pt's mom to return call to discuss.

## 2022-11-01 NOTE — Telephone Encounter (Signed)
PARoxetine (PAXIL) 20 MG tablet   Pt's mom corrected medication that is being requested. Please advise if okay for virtual visit

## 2022-11-01 NOTE — Telephone Encounter (Signed)
Patients mother(Maryann) called and is asking for an increase in ezetimibe (ZETIA) 10 MG table to take the edge off of her. She has been under a lot of stress when it comes to her daughter and has recently started treatment for cancer . Her mother reports tremors that started on Sat. Please give the patient a call or her mother a call to discuss her request.

## 2022-11-18 DIAGNOSIS — C569 Malignant neoplasm of unspecified ovary: Secondary | ICD-10-CM | POA: Diagnosis not present

## 2022-11-18 DIAGNOSIS — Z5111 Encounter for antineoplastic chemotherapy: Secondary | ICD-10-CM | POA: Diagnosis not present

## 2022-12-09 DIAGNOSIS — C562 Malignant neoplasm of left ovary: Secondary | ICD-10-CM | POA: Diagnosis not present

## 2022-12-09 DIAGNOSIS — C569 Malignant neoplasm of unspecified ovary: Secondary | ICD-10-CM | POA: Diagnosis not present

## 2022-12-09 DIAGNOSIS — Z5111 Encounter for antineoplastic chemotherapy: Secondary | ICD-10-CM | POA: Diagnosis not present

## 2022-12-09 DIAGNOSIS — F331 Major depressive disorder, recurrent, moderate: Secondary | ICD-10-CM | POA: Diagnosis not present

## 2022-12-09 DIAGNOSIS — Z800 Family history of malignant neoplasm of digestive organs: Secondary | ICD-10-CM | POA: Diagnosis not present

## 2023-01-11 ENCOUNTER — Other Ambulatory Visit: Payer: Self-pay | Admitting: Family Medicine

## 2023-01-13 DIAGNOSIS — C569 Malignant neoplasm of unspecified ovary: Secondary | ICD-10-CM | POA: Diagnosis not present

## 2023-01-13 DIAGNOSIS — Z5111 Encounter for antineoplastic chemotherapy: Secondary | ICD-10-CM | POA: Diagnosis not present

## 2023-01-13 DIAGNOSIS — C562 Malignant neoplasm of left ovary: Secondary | ICD-10-CM | POA: Diagnosis not present

## 2023-01-27 ENCOUNTER — Encounter: Payer: Self-pay | Admitting: Family Medicine

## 2023-01-27 ENCOUNTER — Ambulatory Visit (INDEPENDENT_AMBULATORY_CARE_PROVIDER_SITE_OTHER): Payer: 59 | Admitting: Family Medicine

## 2023-01-27 VITALS — BP 120/84 | HR 89 | Temp 97.8°F | Wt 112.6 lb

## 2023-01-27 DIAGNOSIS — E782 Mixed hyperlipidemia: Secondary | ICD-10-CM | POA: Diagnosis not present

## 2023-01-27 DIAGNOSIS — Z79899 Other long term (current) drug therapy: Secondary | ICD-10-CM | POA: Diagnosis not present

## 2023-01-27 DIAGNOSIS — F418 Other specified anxiety disorders: Secondary | ICD-10-CM

## 2023-01-27 DIAGNOSIS — M818 Other osteoporosis without current pathological fracture: Secondary | ICD-10-CM | POA: Diagnosis not present

## 2023-01-27 DIAGNOSIS — D84821 Immunodeficiency due to drugs: Secondary | ICD-10-CM | POA: Diagnosis not present

## 2023-01-27 DIAGNOSIS — Z23 Encounter for immunization: Secondary | ICD-10-CM

## 2023-01-27 DIAGNOSIS — C562 Malignant neoplasm of left ovary: Secondary | ICD-10-CM

## 2023-01-27 MED ORDER — ALENDRONATE SODIUM 70 MG PO TABS: 70.0000 mg | ORAL_TABLET | ORAL | 11 refills | Status: DC

## 2023-01-27 MED ORDER — PAROXETINE HCL 30 MG PO TABS: 30.0000 mg | ORAL_TABLET | Freq: Every day | ORAL | 1 refills | Status: DC

## 2023-01-27 MED ORDER — EZETIMIBE 10 MG PO TABS: 10.0000 mg | ORAL_TABLET | Freq: Every day | ORAL | 3 refills | Status: DC

## 2023-01-27 NOTE — Patient Instructions (Addendum)

## 2023-01-27 NOTE — Progress Notes (Signed)
Patient ID: Michelle Arnold, female  DOB: 04-14-1965, 57 y.o.   MRN: [id number] Patient Care Team    Relationship Specialty Notifications Start End  Natalia Leatherwood, DO PCP - General Family Medicine  11/03/15   Rollene Rotunda, MD PCP - Cardiology Cardiology  07/29/22     Chief Complaint  Patient presents with   Depression    Next treatment 10/31    Subjective: Michelle Arnold is a 57 y.o.  Female  present for Chronic Conditions/illness Management AVS provided to patient today for education/recommendation on gender specific health and safety maintenance.   Mixed hyperlipidemia Patient reports compliance with zeita. Lipids responded well to medication.    osteoporosis without current pathological fracture Compliant with  fosamax once weekly dose.  Compliant with vitamin D supplementation   Depression/anxiety: Patient reports compliance with Paxil 20 mg daily.  She reports today she feels her symptoms are ok, sometimes irritable and shakes.  She diagnosed with ovarian cancer and underwent chemo treatments. She has 2 more chemo sessions left .     01/27/2023    9:10 AM 04/14/2022    9:43 AM 08/04/2021    9:55 AM 08/04/2021    9:13 AM 12/26/2020    9:48 AM  Depression screen PHQ 2/9  Decreased Interest 0 2 3 0 0  Down, Depressed, Hopeless 0 2 3 0 0  PHQ - 2 Score 0 4 6 0 0  Altered sleeping 0 2 2  0  Tired, decreased energy 0 2 2  0  Change in appetite 0 0 0  0  Feeling bad or failure about yourself  0 2 0  0  Trouble concentrating 0 3 0  0  Moving slowly or fidgety/restless 0 3 0  0  Suicidal thoughts 0 0 0  0  PHQ-9 Score 0 16 10  0  Difficult doing work/chores Not difficult at all          01/27/2023    9:10 AM 08/04/2021    9:55 AM 12/26/2020    9:48 AM 09/25/2020    9:44 AM  GAD 7 : Generalized Anxiety Score  Nervous, Anxious, on Edge 0 3 0 1  Control/stop worrying 0 2 0 1  Worry too much - different things 0 2 0 1  Trouble relaxing 0 2 0 0  Restless 0 0 0 0  Easily  annoyed or irritable 0 1 0 0  Afraid - awful might happen 0 1 0 0  Total GAD 7 Score 0 11 0 3  Anxiety Difficulty Not difficult at all      Immunization History  Administered Date(s) Administered   Influenza Split 02/04/2012   Influenza Whole 01/04/2011   Influenza,inj,Quad PF,6+ Mos 12/29/2015, 02/02/2017, 01/11/2018, 12/28/2018, 12/20/2019, 12/26/2020   PFIZER(Purple Top)SARS-COV-2 Vaccination 11/08/2019, 11/29/2019   PNEUMOCOCCAL CONJUGATE-20 08/04/2021   Pneumococcal Conjugate-13 06/18/2016   Tdap 11/09/2011, 08/04/2021   Zoster Recombinant(Shingrix) 10/30/2018, 02/01/2019   Past Medical History:  Diagnosis Date   Acute appendicitis 07/22/2022   Allergy    Chicken pox as a child   CTS (carpal tunnel syndrome)    right   Depression with anxiety    Dermatitis 11/09/2011   Right arm   Fatigue 11/09/2011   Grief at loss of child 04/11/2020   Hidradenitis suppurativa 12/16/2011   Hyperlipidemia    Measles as a child   Migraine 11/09/2011   Mumps as a child   Other abnormal Papanicolaou smear of cervix and cervical HPV(795.09)  2004   required LEEP procedure in 04 but no recurrence   Perimenopausal    Polydipsia 01/11/2018   Syncope and collapse 07/28/2022   Tobacco abuse 11/09/2011   Vitamin D deficiency    Allergies  Allergen Reactions   Abilify [Aripiprazole] Other (See Comments)    Suicidal thoughts.    Atorvastatin Other (See Comments)    Memory loss   Statins Other (See Comments)    Memory loss    Past Surgical History:  Procedure Laterality Date   LAPAROSCOPIC APPENDECTOMY N/A 07/23/2022   Procedure: APPENDECTOMY LAPAROSCOPIC;  Surgeon: Griselda Miner, MD;  Location: [facility];  Service: General;  Laterality: N/A;   LEEP  04/05/2002   RADICAL ABDOMINAL HYSTERECTOMY  09/15/2022   Total hysterectomy/BSO and bilateral pelvic and para-aortic lymph node dissection and peritoneal biopsies.-Left ovarian cancer   Family History  Problem Relation Age of Onset    Diabetes Mother        ?   Osteoporosis Mother    Hyperlipidemia Father    Heart disease Father        MI in 15   Hypertension Father    Heart attack Father 69   Heart attack Brother    Cancer Maternal Grandmother        lung- smoker   Osteoporosis Maternal Grandmother    Cancer Maternal Grandfather        prostate   Cataracts Paternal Grandmother    Other Paternal Grandfather        PAD with gangrene   Breast cancer Maternal Aunt    Social History   Social History Narrative   Married. Has children.    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    Everyday smoker. Occasional alcohol. No drugs.     Allergies as of 01/27/2023       Reactions   Abilify [aripiprazole] Other (See Comments)   Suicidal thoughts.    Atorvastatin Other (See Comments)   Memory loss   Statins Other (See Comments)   Memory loss         Medication List        Accurate as of January 27, 2023  9:25 AM. If you have any questions, ask your nurse or doctor.          STOP taking these medications    docusate sodium 250 MG capsule Commonly known as: COLACE Stopped by: Felix Pacini   Eliquis 2.5 MG Tabs tablet Generic drug: apixaban Stopped by: Felix Pacini   Flovent Diskus 50 MCG/ACT Aepb Generic drug: Fluticasone Propionate (Inhal) Stopped by: Felix Pacini   gabapentin 300 MG capsule Commonly known as: NEURONTIN Stopped by: Felix Pacini   magnesium oxide 400 (240 Mg) MG tablet Commonly known as: MAG-OX Stopped by: Felix Pacini   senna 8.6 MG tablet Commonly known as: SENOKOT Stopped by: Felix Pacini   simethicone 80 MG chewable tablet Commonly known as: MYLICON Stopped by: Felix Pacini   TUMS PO Stopped by: Felix Pacini       TAKE these medications    alendronate 70 MG tablet Commonly known as: FOSAMAX Take 1 tablet (70 mg total) by mouth every 7 (seven) days. Take with a full glass of water on an empty stomach.   ascorbic acid 500 MG tablet Commonly known as: VITAMIN C Take  by mouth.   dexamethasone 4 MG tablet Commonly known as: DECADRON Take by mouth.   ezetimibe 10 MG tablet Commonly known as: ZETIA Take 1 tablet (10 mg total) by mouth  daily.   fluticasone 50 MCG/ACT nasal spray Commonly known as: FLONASE Place 1 spray into both nostrils as needed for allergies or rhinitis.   multivitamin-iron-minerals-folic acid Tabs tablet Take 1 tablet by mouth daily.   ondansetron 8 MG tablet Commonly known as: ZOFRAN Take 1 tablet by mouth every 8 (eight) hours as needed.   PARoxetine 30 MG tablet Commonly known as: Paxil Take 1 tablet (30 mg total) by mouth daily. What changed:  medication strength how much to take Changed by: Felix Pacini   promethazine 25 MG tablet Commonly known as: PHENERGAN Take by mouth.   rosuvastatin 10 MG tablet Commonly known as: CRESTOR Take 10 mg by mouth daily.   Vitamin D-3 125 MCG (5000 UT) Tabs Take 5,000 Units by mouth daily.        All past medical history, surgical history, allergies, family history, immunizations andmedications were updated in the EMR today and reviewed under the history and medication portions of their EMR.       No results found.   ROS 14 pt review of systems performed and negative (unless mentioned in an HPI)  Objective: BP 120/84   Pulse 89   Temp 97.8 F (36.6 C)   Wt 112 lb 9.6 oz (51.1 kg)   LMP  (LMP Unknown)   SpO2 98%   BMI 18.99 kg/m  Physical Exam Vitals and nursing note reviewed.  Constitutional:      General: She is not in acute distress.    Appearance: Normal appearance. She is normal weight. She is not ill-appearing or toxic-appearing.  HENT:     Head: Normocephalic and atraumatic.  Eyes:     General: No scleral icterus.       Right eye: No discharge.        Left eye: No discharge.     Extraocular Movements: Extraocular movements intact.     Conjunctiva/sclera: Conjunctivae normal.     Pupils: Pupils are equal, round, and reactive to light.   Skin:    Findings: No rash.  Neurological:     Mental Status: She is alert and oriented to person, place, and time. Mental status is at baseline.     Motor: No weakness.     Coordination: Coordination normal.     Gait: Gait normal.  Psychiatric:        Mood and Affect: Mood normal.        Behavior: Behavior normal.        Thought Content: Thought content normal.        Judgment: Judgment normal.      No results found.  Assessment/plan: CYRIAH WEINAND is a 57 y.o. female present for chronic condition management osteoporosis without current pathological fracture Continue fosamax once weekly.  Repeat DEXA due, active order still in place for 08/12/2022 Vitamin D levels collected today.  Continue supplementation   Mixed hyperlipidemia/Allergy to statin medication Stable Continue zeita  Depression/anxiety: Some increased irritability shakes at this time.  increase Paxil 20 mg > 30 mg daily  Ovarian cancer on left (HCC)/Immunocompromised state due to drug therapy (HCC) 2 tx left. Will be done by the end of November.   Influenza vaccine needed Delayed for now. Due for chemo in 1 week. Encouraged her to ask onc timeline between chemo to have flu shot.    Return in about 24 weeks (around 07/14/2023) for Routine chronic condition follow-up.   No orders of the defined types were placed in this encounter.  Meds ordered this encounter  Medications   PARoxetine (PAXIL) 30 MG tablet    Sig: Take 1 tablet (30 mg total) by mouth daily.    Dispense:  90 tablet    Refill:  1   ezetimibe (ZETIA) 10 MG tablet    Sig: Take 1 tablet (10 mg total) by mouth daily.    Dispense:  90 tablet    Refill:  3   alendronate (FOSAMAX) 70 MG tablet    Sig: Take 1 tablet (70 mg total) by mouth every 7 (seven) days. Take with a full glass of water on an empty stomach.    Dispense:  4 tablet    Refill:  11   Referral Orders  No referral(s) requested today     Electronically signed by: Felix Pacini, DO [facility] [place]

## 2023-02-03 DIAGNOSIS — Z5111 Encounter for antineoplastic chemotherapy: Secondary | ICD-10-CM | POA: Diagnosis not present

## 2023-02-03 DIAGNOSIS — C562 Malignant neoplasm of left ovary: Secondary | ICD-10-CM | POA: Diagnosis not present

## 2023-02-03 DIAGNOSIS — C569 Malignant neoplasm of unspecified ovary: Secondary | ICD-10-CM | POA: Diagnosis not present

## 2023-02-24 DIAGNOSIS — C569 Malignant neoplasm of unspecified ovary: Secondary | ICD-10-CM | POA: Diagnosis not present

## 2023-02-24 DIAGNOSIS — Z5111 Encounter for antineoplastic chemotherapy: Secondary | ICD-10-CM | POA: Diagnosis not present

## 2023-02-28 DIAGNOSIS — Z800 Family history of malignant neoplasm of digestive organs: Secondary | ICD-10-CM | POA: Diagnosis not present

## 2023-02-28 DIAGNOSIS — Z808 Family history of malignant neoplasm of other organs or systems: Secondary | ICD-10-CM | POA: Diagnosis not present

## 2023-02-28 DIAGNOSIS — C562 Malignant neoplasm of left ovary: Secondary | ICD-10-CM | POA: Diagnosis not present

## 2023-03-17 DIAGNOSIS — R911 Solitary pulmonary nodule: Secondary | ICD-10-CM | POA: Diagnosis not present

## 2023-03-17 DIAGNOSIS — Z90710 Acquired absence of both cervix and uterus: Secondary | ICD-10-CM | POA: Diagnosis not present

## 2023-03-17 DIAGNOSIS — K7689 Other specified diseases of liver: Secondary | ICD-10-CM | POA: Diagnosis not present

## 2023-03-17 DIAGNOSIS — C569 Malignant neoplasm of unspecified ovary: Secondary | ICD-10-CM | POA: Diagnosis not present

## 2023-03-17 DIAGNOSIS — C563 Malignant neoplasm of bilateral ovaries: Secondary | ICD-10-CM | POA: Diagnosis not present

## 2023-03-17 LAB — CT CHEST LUNG CANCER SCREENING LOW DOSE WO CONTRAST

## 2023-06-16 DIAGNOSIS — Z1231 Encounter for screening mammogram for malignant neoplasm of breast: Secondary | ICD-10-CM | POA: Diagnosis not present

## 2023-06-16 DIAGNOSIS — C569 Malignant neoplasm of unspecified ovary: Secondary | ICD-10-CM | POA: Diagnosis not present

## 2023-07-11 ENCOUNTER — Other Ambulatory Visit: Payer: Self-pay | Admitting: Family Medicine

## 2023-07-22 DIAGNOSIS — F5105 Insomnia due to other mental disorder: Secondary | ICD-10-CM | POA: Insufficient documentation

## 2023-07-22 DIAGNOSIS — F4323 Adjustment disorder with mixed anxiety and depressed mood: Secondary | ICD-10-CM | POA: Diagnosis not present

## 2023-07-22 DIAGNOSIS — F308 Other manic episodes: Secondary | ICD-10-CM | POA: Insufficient documentation

## 2023-07-22 DIAGNOSIS — F411 Generalized anxiety disorder: Secondary | ICD-10-CM | POA: Diagnosis not present

## 2023-07-26 ENCOUNTER — Ambulatory Visit: Payer: Self-pay

## 2023-07-26 NOTE — Telephone Encounter (Signed)
 Communication  Reason for CRM: Patient was advised to go to ED from triage nurse to have blood work done for some odd behaviors that her husband reported. Patient called in stating that she has an appointment on 07/28/2023 with PCP and would rather see her then instead of going to ED. Patient said that she already seen psychiatrist and isn't in any state to hurt herself or any one else.    No further action needed.
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
## 2023-07-28 ENCOUNTER — Ambulatory Visit: Payer: 59 | Admitting: Family Medicine

## 2023-07-28 VITALS — BP 126/78 | HR 79 | Temp 98.3°F | Wt 118.0 lb

## 2023-07-28 DIAGNOSIS — Z79899 Other long term (current) drug therapy: Secondary | ICD-10-CM | POA: Diagnosis not present

## 2023-07-28 DIAGNOSIS — M818 Other osteoporosis without current pathological fracture: Secondary | ICD-10-CM | POA: Diagnosis not present

## 2023-07-28 DIAGNOSIS — F418 Other specified anxiety disorders: Secondary | ICD-10-CM | POA: Diagnosis not present

## 2023-07-28 DIAGNOSIS — F310 Bipolar disorder, current episode hypomanic: Secondary | ICD-10-CM

## 2023-07-28 DIAGNOSIS — E611 Iron deficiency: Secondary | ICD-10-CM | POA: Diagnosis not present

## 2023-07-28 DIAGNOSIS — E559 Vitamin D deficiency, unspecified: Secondary | ICD-10-CM

## 2023-07-28 DIAGNOSIS — D84821 Immunodeficiency due to drugs: Secondary | ICD-10-CM

## 2023-07-28 DIAGNOSIS — E782 Mixed hyperlipidemia: Secondary | ICD-10-CM

## 2023-07-28 DIAGNOSIS — Z1231 Encounter for screening mammogram for malignant neoplasm of breast: Secondary | ICD-10-CM | POA: Diagnosis not present

## 2023-07-28 LAB — IBC + FERRITIN
Ferritin: 45.4 ng/mL (ref 10.0–291.0)
Iron: 68 ug/dL (ref 42–145)
Saturation Ratios: 21.1 % (ref 20.0–50.0)
TIBC: 322 ug/dL (ref 250.0–450.0)
Transferrin: 230 mg/dL (ref 212.0–360.0)

## 2023-07-28 LAB — CBC
HCT: 40 % (ref 36.0–46.0)
Hemoglobin: 13.5 g/dL (ref 12.0–15.0)
MCHC: 33.7 g/dL (ref 30.0–36.0)
MCV: 95.4 fl (ref 78.0–100.0)
Platelets: 349 10*3/uL (ref 150.0–400.0)
RBC: 4.19 Mil/uL (ref 3.87–5.11)
RDW: 13.1 % (ref 11.5–15.5)
WBC: 8.4 10*3/uL (ref 4.0–10.5)

## 2023-07-28 LAB — COMPREHENSIVE METABOLIC PANEL WITH GFR
ALT: 17 U/L (ref 0–35)
AST: 20 U/L (ref 0–37)
Albumin: 4.2 g/dL (ref 3.5–5.2)
Alkaline Phosphatase: 61 U/L (ref 39–117)
BUN: 11 mg/dL (ref 6–23)
CO2: 28 meq/L (ref 19–32)
Calcium: 9.2 mg/dL (ref 8.4–10.5)
Chloride: 101 meq/L (ref 96–112)
Creatinine, Ser: 0.55 mg/dL (ref 0.40–1.20)
GFR: 100.05 mL/min (ref >60.00)
Glucose, Bld: 89 mg/dL (ref 70–99)
Potassium: 4.8 meq/L (ref 3.5–5.1)
Sodium: 135 meq/L (ref 135–145)
Total Bilirubin: 0.3 mg/dL (ref 0.2–1.2)
Total Protein: 6.6 g/dL (ref 6.0–8.3)

## 2023-07-28 LAB — LIPID PANEL
Cholesterol: 203 mg/dL — ABNORMAL HIGH (ref 0–200)
HDL: 82.7 mg/dL (ref >39.00)
LDL Cholesterol: 106 mg/dL — ABNORMAL HIGH (ref 0–99)
NonHDL: 120.56
Total CHOL/HDL Ratio: 2
Triglycerides: 73 mg/dL (ref 0.0–149.0)
VLDL: 14.6 mg/dL (ref 0.0–40.0)

## 2023-07-28 LAB — VITAMIN D 25 HYDROXY (VIT D DEFICIENCY, FRACTURES): VITD: 33.4 ng/mL (ref 30.00–100.00)

## 2023-07-28 LAB — TSH: TSH: 0.68 u[IU]/mL (ref 0.35–5.50)

## 2023-07-28 NOTE — Patient Instructions (Signed)
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
## 2023-07-29 ENCOUNTER — Encounter: Payer: Self-pay | Admitting: Family Medicine

## 2023-08-01 NOTE — Telephone Encounter (Signed)
 No further action needed.

## 2023-08-11 DIAGNOSIS — F3163 Bipolar disorder, current episode mixed, severe, without psychotic features: Secondary | ICD-10-CM | POA: Diagnosis not present

## 2023-08-11 DIAGNOSIS — F411 Generalized anxiety disorder: Secondary | ICD-10-CM | POA: Diagnosis not present

## 2023-08-11 DIAGNOSIS — F4323 Adjustment disorder with mixed anxiety and depressed mood: Secondary | ICD-10-CM | POA: Diagnosis not present

## 2023-08-11 DIAGNOSIS — F5105 Insomnia due to other mental disorder: Secondary | ICD-10-CM | POA: Diagnosis not present

## 2023-08-18 DIAGNOSIS — F3163 Bipolar disorder, current episode mixed, severe, without psychotic features: Secondary | ICD-10-CM | POA: Diagnosis not present

## 2023-08-18 DIAGNOSIS — F4323 Adjustment disorder with mixed anxiety and depressed mood: Secondary | ICD-10-CM | POA: Diagnosis not present

## 2023-08-18 DIAGNOSIS — F411 Generalized anxiety disorder: Secondary | ICD-10-CM | POA: Diagnosis not present

## 2023-08-18 DIAGNOSIS — F5105 Insomnia due to other mental disorder: Secondary | ICD-10-CM | POA: Diagnosis not present

## 2023-08-22 LAB — HM MAMMOGRAPHY: HM Mammogram: NORMAL (ref 0–4)

## 2023-08-26 DIAGNOSIS — F3163 Bipolar disorder, current episode mixed, severe, without psychotic features: Secondary | ICD-10-CM | POA: Diagnosis not present

## 2023-08-26 DIAGNOSIS — F5105 Insomnia due to other mental disorder: Secondary | ICD-10-CM | POA: Diagnosis not present

## 2023-08-26 DIAGNOSIS — F411 Generalized anxiety disorder: Secondary | ICD-10-CM | POA: Diagnosis not present

## 2023-08-26 DIAGNOSIS — F4323 Adjustment disorder with mixed anxiety and depressed mood: Secondary | ICD-10-CM | POA: Diagnosis not present

## 2023-09-01 ENCOUNTER — Other Ambulatory Visit: Payer: Self-pay | Admitting: Family Medicine

## 2023-09-01 NOTE — Telephone Encounter (Signed)
 Copied from CRM [id number]. Topic: Clinical - Medication Question >> Sep 01, 2023 11:42 AM Luane Rumps D wrote: Reason for CRM: Patient request for cetirizine  (ZYRTEC ) 10 MG table refused: refill not appropriate, patient uses it everyday because pollen is really bad this year. She would like a refill if possible or follow-up on refusal reason.

## 2023-09-06 ENCOUNTER — Telehealth: Payer: Self-pay

## 2023-09-06 NOTE — Telephone Encounter (Signed)
 Copied from CRM [id number]. Topic: Clinical - Medical Advice >> Sep 06, 2023  3:50 PM Alyse July wrote: Reason for CRM: Patient received notification that she is due for multiple vaccinations. Please contact patient advised. [phone].   Patient's chart was reviewed and she is not currently due for any vaccinations until the fall if she elects to receive flu vaccine. LVM for pt to return call or check MyChart message sent on 6/3

## 2023-09-09 ENCOUNTER — Other Ambulatory Visit: Payer: Self-pay | Admitting: Family Medicine

## 2023-09-14 ENCOUNTER — Telehealth: Payer: Self-pay | Admitting: Family Medicine

## 2023-09-14 ENCOUNTER — Telehealth: Payer: Self-pay

## 2023-09-14 DIAGNOSIS — F3163 Bipolar disorder, current episode mixed, severe, without psychotic features: Secondary | ICD-10-CM | POA: Diagnosis not present

## 2023-09-14 DIAGNOSIS — F4323 Adjustment disorder with mixed anxiety and depressed mood: Secondary | ICD-10-CM | POA: Diagnosis not present

## 2023-09-14 DIAGNOSIS — F411 Generalized anxiety disorder: Secondary | ICD-10-CM | POA: Diagnosis not present

## 2023-09-14 NOTE — Telephone Encounter (Signed)
 LVM to discuss

## 2023-09-14 NOTE — Telephone Encounter (Signed)
 Copied from CRM [id number]. Topic: Clinical - Prescription Issue >> Sep 14, 2023 11:56 AM Chuck Crater wrote: Reason for CRM: Patient ran out of cetirizine  (ZYRTEC ) 10 MG tablet a week ago and states that she really needs it. She wants to know why refill is not appropriate.

## 2023-09-14 NOTE — Telephone Encounter (Signed)
 Reason for CRM: Patient states that she is going to have a mammogram and a wellness with OBGYN on 07/28. Patient has a list of things that need to be done and have questions about it. She said the list was on her mychart. She is requesting a callback.

## 2023-09-20 DIAGNOSIS — L539 Erythematous condition, unspecified: Secondary | ICD-10-CM | POA: Diagnosis not present

## 2023-09-20 DIAGNOSIS — C562 Malignant neoplasm of left ovary: Secondary | ICD-10-CM | POA: Diagnosis not present

## 2023-09-21 DIAGNOSIS — F4323 Adjustment disorder with mixed anxiety and depressed mood: Secondary | ICD-10-CM | POA: Diagnosis not present

## 2023-09-21 DIAGNOSIS — F5105 Insomnia due to other mental disorder: Secondary | ICD-10-CM | POA: Diagnosis not present

## 2023-09-21 DIAGNOSIS — F411 Generalized anxiety disorder: Secondary | ICD-10-CM | POA: Diagnosis not present

## 2023-09-21 DIAGNOSIS — F3163 Bipolar disorder, current episode mixed, severe, without psychotic features: Secondary | ICD-10-CM | POA: Diagnosis not present

## 2023-09-29 ENCOUNTER — Encounter: Payer: Self-pay | Admitting: Family Medicine

## 2023-09-29 ENCOUNTER — Ambulatory Visit: Admitting: Family Medicine

## 2023-09-29 VITALS — BP 122/82 | HR 88 | Temp 98.2°F | Wt 116.6 lb

## 2023-09-29 DIAGNOSIS — E782 Mixed hyperlipidemia: Secondary | ICD-10-CM | POA: Diagnosis not present

## 2023-09-29 DIAGNOSIS — F5105 Insomnia due to other mental disorder: Secondary | ICD-10-CM | POA: Diagnosis not present

## 2023-09-29 DIAGNOSIS — F418 Other specified anxiety disorders: Secondary | ICD-10-CM | POA: Diagnosis not present

## 2023-09-29 DIAGNOSIS — F3163 Bipolar disorder, current episode mixed, severe, without psychotic features: Secondary | ICD-10-CM | POA: Diagnosis not present

## 2023-09-29 DIAGNOSIS — F308 Other manic episodes: Secondary | ICD-10-CM

## 2023-09-29 MED ORDER — EZETIMIBE 10 MG PO TABS: 10.0000 mg | ORAL_TABLET | Freq: Every day | ORAL | 3 refills | Status: AC

## 2023-09-29 NOTE — Progress Notes (Signed)
 Patient ID: Michelle Arnold, female  DOB: [birth year], 58 y.o.   MRN: [id number] Patient Care Team    Relationship Specialty Notifications Start End  Catherine Charlies LABOR, DO PCP - General Family Medicine  11/03/15   Lavona Agent, MD PCP - Cardiology Cardiology  07/29/22     Chief Complaint  Patient presents with   Hyperlipidemia    Subjective: Michelle Arnold is a 58 y.o.  Female  present for Chronic Conditions/illness Management AVS provided to patient today for education/recommendation on gender specific health and safety maintenance. I believe Michelle Arnold, Michelle Arnold  -psych Stout, Kimberly A    - hem/onc 0.5 mg ativan  if needed in the day 1 mg ativan  at night.  Lithium 300 mg at bedtime> norml bmp 09/20/2023 [facility] - pt order   Mixed hyperlipidemia Patient reports compliance with zeita. Lipids responded well to medication.    osteoporosis without current pathological fracture Compliant with  fosamax  once weekly dose.  compliant  with vitamin D  supplementation   Depression/anxiety: Patient reports she feels much better today.  She is now on Ativan  and lithium.  She feels her mood is stabilized.  She is happy.  She is in counseling.  She plans to attend counseling with her husband.  She feels her family is struggling to understand the changes she has been experiencing and she is glad her husband has is willing to start therapy with her.  Prior note Patient reports her new psychiatrist (Dr. GEANNIE Arnold) has tapered off paxil  and increased/restarted ativan  to 0.5 in the day and 1-2 mg at night.  Mother is present also today and reports she is manic. She was not sleeping at night at all.  Since psychiatry prescribed her Ativan , which patient states she is taking 1 mg at night, patient is very groggy in the morning.  Patient is groggy today, but she states she took the Ativan  1 mg at about 11 PM last night.   Mother reports she is not acting right. Or herself since med  changes. She is experiencing angry outburst and had an episode of road rage with grandchildren in the car. Patient reports she is extremely happy and energetic.  She does admit to being easily distracted, and has since reverted to making lists for her to complete daily in order, so that she prioritizes her time to what needs to be completed first. She feels her family does not understand that her energy and happiness, is because she feels blessed to still be alive and she wants to get back to living her life.  She reports there are many things to get done now that she is able to do them and not going through chemo. She also reports her family feels like she is being loud and she thinks something is wrong with her.  She reports she feels they are used to the new her.  She reports she is no longer enabling her daughter and has put her foot down when it comes to her behavior.  She feels her husband does not understand because she is not allowing him to have as much control over her now.  She states she does her mother means well and her heart is in the right place, but she does not want her mother letting and when she is recommending her grandson. Patient reports she is sleeping, now that she is using Ativan .  She states she is only using Ativan  1 mg.     07/28/2023  9:49 AM 01/27/2023    9:10 AM 04/14/2022    9:43 AM 08/04/2021    9:55 AM 08/04/2021    9:13 AM  Depression screen PHQ 2/9  Decreased Interest 0 0 2 3 0  Down, Depressed, Hopeless 0 0 2 3 0  PHQ - 2 Score 0 0 4 6 0  Altered sleeping 3 0 2 2   Tired, decreased energy 0 0 2 2   Change in appetite 0 0 0 0   Feeling bad or failure about yourself  2 0 2 0   Trouble concentrating 3 0 3 0   Moving slowly or fidgety/restless 3 0 3 0   Suicidal thoughts 0 0 0 0   PHQ-9 Score 11 0 16 10   Difficult doing work/chores Very difficult Not difficult at all         07/28/2023    9:50 AM 01/27/2023    9:10 AM 08/04/2021    9:55 AM 12/26/2020    9:48  AM  GAD 7 : Generalized Anxiety Score  Nervous, Anxious, on Edge 3 0 3 0  Control/stop worrying 1 0 2 0  Worry too much - different things 3 0 2 0  Trouble relaxing 1 0 2 0  Restless 1 0 0 0  Easily annoyed or irritable 1 0 1 0  Afraid - awful might happen 1 0 1 0  Total GAD 7 Score 11 0 11 0  Anxiety Difficulty Somewhat difficult Not difficult at all     Immunization History  Administered Date(s) Administered   Influenza Split 02/04/2012   Influenza Whole 01/04/2011   Influenza,inj,Quad PF,6+ Mos 12/29/2015, 02/02/2017, 01/11/2018, 12/28/2018, 12/20/2019, 12/26/2020   PFIZER(Purple Top)SARS-COV-2 Vaccination 11/08/2019, 11/29/2019   PNEUMOCOCCAL CONJUGATE-20 08/04/2021   Pneumococcal Conjugate-13 06/18/2016   Tdap 11/09/2011, 08/04/2021   Zoster Recombinant(Shingrix ) 10/30/2018, 02/01/2019   Past Medical History:  Diagnosis Date   Acute appendicitis 07/22/2022   Allergy    Chicken pox as a child   CTS (carpal tunnel syndrome)    right   Depression with anxiety    Dermatitis 11/09/2011   Right arm   Fatigue 11/09/2011   Grief at loss of child 04/11/2020   Hidradenitis suppurativa 12/16/2011   Hyperlipidemia    Measles as a child   Migraine 11/09/2011   Mumps as a child   Other abnormal Papanicolaou smear of cervix and cervical HPV(795.09) 2004   required LEEP procedure in 04 but no recurrence   Perimenopausal    Polydipsia 01/11/2018   Syncope and collapse 07/28/2022   Tobacco abuse 11/09/2011   Vitamin D  deficiency    Allergies  Allergen Reactions   Abilify  [Aripiprazole ] Other (See Comments)    Suicidal thoughts.    Atorvastatin Other (See Comments)    Memory loss   Statins Other (See Comments)    Memory loss    Past Surgical History:  Procedure Laterality Date   LAPAROSCOPIC APPENDECTOMY N/A 07/23/2022   Procedure: APPENDECTOMY LAPAROSCOPIC;  Surgeon: Curvin Deward MOULD, MD;  Location: [facility];  Service: General;  Laterality: N/A;   LEEP  04/05/2002    RADICAL ABDOMINAL HYSTERECTOMY  09/15/2022   Total hysterectomy/BSO and bilateral pelvic and para-aortic lymph node dissection and peritoneal biopsies.-Left ovarian cancer   Family History  Problem Relation Age of Onset   Diabetes Mother        ?   Osteoporosis Mother    Hyperlipidemia Father    Heart disease Father  MI in 1994   Hypertension Father    Heart attack Father 39   Heart attack Brother    Cancer Maternal Grandmother        lung- smoker   Osteoporosis Maternal Grandmother    Cancer Maternal Grandfather        prostate   Cataracts Paternal Grandmother    Other Paternal Grandfather        PAD with gangrene   Breast cancer Maternal Aunt    Social History   Social History Narrative   Married. Has children.    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    Everyday smoker. Occasional alcohol. No drugs.     Allergies as of 09/29/2023       Reactions   Abilify  [aripiprazole ] Other (See Comments)   Suicidal thoughts.    Atorvastatin Other (See Comments)   Memory loss   Statins Other (See Comments)   Memory loss         Medication List        Accurate as of September 29, 2023  1:01 PM. If you have any questions, ask your nurse or doctor.          STOP taking these medications    PARoxetine  10 MG tablet Commonly known as: PAXIL  Stopped by: Charlies Bellini       TAKE these medications    alendronate  70 MG tablet Commonly known as: FOSAMAX  Take 1 tablet (70 mg total) by mouth every 7 (seven) days. Take with a full glass of water on an empty stomach.   ascorbic acid 500 MG tablet Commonly known as: VITAMIN C Take by mouth.   B-12 1000 MCG Caps   ezetimibe  10 MG tablet Commonly known as: ZETIA  Take 1 tablet (10 mg total) by mouth daily.   fluticasone  50 MCG/ACT nasal spray Commonly known as: FLONASE  Place 1 spray into both nostrils as needed for allergies or rhinitis.   lithium carbonate 300 MG capsule Take 300 mg by mouth at bedtime.   LORazepam  1 MG  tablet Commonly known as: ATIVAN  Take by mouth.   Vitamin D -3 125 MCG (5000 UT) Tabs Take 5,000 Units by mouth daily.        All past medical history, surgical history, allergies, family history, immunizations andmedications were updated in the EMR today and reviewed under the history and medication portions of their EMR.       No results found.   ROS 14 pt review of systems performed and negative (unless mentioned in an HPI)  Objective: BP 122/82   Pulse 88   Temp 98.2 F (36.8 C)   Wt 116 lb 9.6 oz (52.9 kg)   LMP  (LMP Unknown)   SpO2 97%   BMI 19.66 kg/m  Physical Exam Vitals and nursing note reviewed.  Constitutional:      General: She is not in acute distress.    Appearance: Normal appearance. She is normal weight. She is not ill-appearing or toxic-appearing.  HENT:     Head: Normocephalic and atraumatic.   Eyes:     General: No scleral icterus.       Right eye: No discharge.        Left eye: No discharge.     Extraocular Movements: Extraocular movements intact.     Conjunctiva/sclera: Conjunctivae normal.     Pupils: Pupils are equal, round, and reactive to light.    Cardiovascular:     Rate and Rhythm: Normal rate and regular rhythm.  Pulmonary:  Effort: Pulmonary effort is normal.     Breath sounds: Normal breath sounds.   Musculoskeletal:     Cervical back: Neck supple.   Skin:    Findings: No rash.   Neurological:     Mental Status: She is alert and oriented to person, place, and time. Mental status is at baseline.     Motor: No weakness.     Coordination: Coordination normal.     Gait: Gait normal.   Psychiatric:        Mood and Affect: Mood normal. Mood is not anxious, depressed or elated. Affect is not tearful.        Speech: Speech normal. Speech is not rapid and pressured or slurred.        Behavior: Behavior is not agitated, aggressive, withdrawn or hyperactive. Behavior is cooperative.        Thought Content: Thought content  normal. Thought content is not paranoid. Thought content does not include homicidal or suicidal ideation.        Cognition and Memory: Cognition and memory normal.        Judgment: Judgment is neither impulsive nor inappropriate.      No results found.  Assessment/plan: NECHELLE PETRIZZO is a 58 y.o. female present for chronic condition management osteoporosis without current pathological fracture Continue fosamax  once weekly.  Repeat DEXA due, active order still in place Vitamin D  levels UTD Continue supplementation   Mixed hyperlipidemia/Allergy to statin medication Stable Continue zeita  Depression/anxiety-bipolar disorder: Patient is doing well.  She seems to be in a better place than she was last time.  Lithium addition seems to be stabilizing her well. Sleeping has improved She is attending therapy sessions   Has cpe sdcheduled for Septemeber.     No orders of the defined types were placed in this encounter.  Meds ordered this encounter  Medications   ezetimibe  (ZETIA ) 10 MG tablet    Sig: Take 1 tablet (10 mg total) by mouth daily.    Dispense:  90 tablet    Refill:  3   Referral Orders  No referral(s) requested today     Electronically signed by: Charlies Bellini, DO [facility] [place]

## 2023-09-29 NOTE — Patient Instructions (Addendum)
   See you in September.       Great to see you today.  I have refilled the medication(s) we provide.   If labs were collected or images ordered, we will inform you of  results once we have received them and reviewed. We will contact you either by echart message, or telephone call.  Please give ample time to the testing facility, and our office to run,  receive and review results. Please do not call inquiring of results, even if you can see them in your chart. We will contact you as soon as we are able. If it has been over 1 week since the test was completed, and you have not yet heard from us , then please call us .    - echart message- for normal results that have been seen by the patient already.   - telephone call: abnormal results or if patient has not viewed results in their echart.  If a referral to a specialist was entered for you, please call us  in 2 weeks if you have not heard from the specialist office to schedule.

## 2023-09-30 DIAGNOSIS — F3160 Bipolar disorder, current episode mixed, unspecified: Secondary | ICD-10-CM | POA: Diagnosis not present

## 2023-09-30 DIAGNOSIS — Z79899 Other long term (current) drug therapy: Secondary | ICD-10-CM | POA: Diagnosis not present

## 2023-09-30 DIAGNOSIS — F411 Generalized anxiety disorder: Secondary | ICD-10-CM | POA: Diagnosis not present

## 2023-09-30 DIAGNOSIS — F4323 Adjustment disorder with mixed anxiety and depressed mood: Secondary | ICD-10-CM | POA: Diagnosis not present

## 2023-10-11 ENCOUNTER — Telehealth: Payer: Self-pay

## 2023-10-11 DIAGNOSIS — M546 Pain in thoracic spine: Secondary | ICD-10-CM

## 2023-10-11 NOTE — Telephone Encounter (Signed)
 Reason for CRM: pt called back and stated that she was supposed to get a referral sent to [facility] but she hasn't heard back. She asked for the nurse to give her a call back  I could not find order for referral. Please advise. Thank you, Alford Gamero

## 2023-10-17 NOTE — Telephone Encounter (Signed)
 Ordered placed to OR PT- pls inform pt

## 2023-10-27 DIAGNOSIS — R293 Abnormal posture: Secondary | ICD-10-CM | POA: Diagnosis not present

## 2023-10-27 DIAGNOSIS — M542 Cervicalgia: Secondary | ICD-10-CM | POA: Diagnosis not present

## 2023-10-27 DIAGNOSIS — M546 Pain in thoracic spine: Secondary | ICD-10-CM | POA: Diagnosis not present

## 2023-10-28 DIAGNOSIS — Z79899 Other long term (current) drug therapy: Secondary | ICD-10-CM | POA: Diagnosis not present

## 2023-10-28 DIAGNOSIS — F5105 Insomnia due to other mental disorder: Secondary | ICD-10-CM | POA: Diagnosis not present

## 2023-10-28 DIAGNOSIS — F4323 Adjustment disorder with mixed anxiety and depressed mood: Secondary | ICD-10-CM | POA: Diagnosis not present

## 2023-10-28 DIAGNOSIS — F3176 Bipolar disorder, in full remission, most recent episode depressed: Secondary | ICD-10-CM | POA: Diagnosis not present

## 2023-10-28 DIAGNOSIS — F411 Generalized anxiety disorder: Secondary | ICD-10-CM | POA: Diagnosis not present

## 2023-10-31 DIAGNOSIS — Z1231 Encounter for screening mammogram for malignant neoplasm of breast: Secondary | ICD-10-CM | POA: Diagnosis not present

## 2023-10-31 DIAGNOSIS — Z01419 Encounter for gynecological examination (general) (routine) without abnormal findings: Secondary | ICD-10-CM | POA: Diagnosis not present

## 2023-10-31 DIAGNOSIS — Z1331 Encounter for screening for depression: Secondary | ICD-10-CM | POA: Diagnosis not present

## 2023-11-01 DIAGNOSIS — M542 Cervicalgia: Secondary | ICD-10-CM | POA: Diagnosis not present

## 2023-11-01 DIAGNOSIS — M546 Pain in thoracic spine: Secondary | ICD-10-CM | POA: Diagnosis not present

## 2023-11-01 DIAGNOSIS — R293 Abnormal posture: Secondary | ICD-10-CM | POA: Diagnosis not present

## 2023-11-03 DIAGNOSIS — M542 Cervicalgia: Secondary | ICD-10-CM | POA: Diagnosis not present

## 2023-11-03 DIAGNOSIS — M546 Pain in thoracic spine: Secondary | ICD-10-CM | POA: Diagnosis not present

## 2023-11-03 DIAGNOSIS — R293 Abnormal posture: Secondary | ICD-10-CM | POA: Diagnosis not present

## 2023-11-08 DIAGNOSIS — M542 Cervicalgia: Secondary | ICD-10-CM | POA: Diagnosis not present

## 2023-11-08 DIAGNOSIS — R293 Abnormal posture: Secondary | ICD-10-CM | POA: Diagnosis not present

## 2023-11-08 DIAGNOSIS — M546 Pain in thoracic spine: Secondary | ICD-10-CM | POA: Diagnosis not present

## 2023-11-09 ENCOUNTER — Ambulatory Visit (INDEPENDENT_AMBULATORY_CARE_PROVIDER_SITE_OTHER): Admitting: Family Medicine

## 2023-11-09 ENCOUNTER — Encounter: Payer: Self-pay | Admitting: Family Medicine

## 2023-11-09 ENCOUNTER — Ambulatory Visit: Payer: Self-pay | Admitting: Family Medicine

## 2023-11-09 VITALS — BP 120/82 | HR 84 | Temp 98.3°F | Ht 62.0 in | Wt 122.0 lb

## 2023-11-09 DIAGNOSIS — M818 Other osteoporosis without current pathological fracture: Secondary | ICD-10-CM

## 2023-11-09 DIAGNOSIS — Z0000 Encounter for general adult medical examination without abnormal findings: Secondary | ICD-10-CM

## 2023-11-09 DIAGNOSIS — Z122 Encounter for screening for malignant neoplasm of respiratory organs: Secondary | ICD-10-CM | POA: Diagnosis not present

## 2023-11-09 DIAGNOSIS — C562 Malignant neoplasm of left ovary: Secondary | ICD-10-CM | POA: Diagnosis not present

## 2023-11-09 DIAGNOSIS — F17210 Nicotine dependence, cigarettes, uncomplicated: Secondary | ICD-10-CM

## 2023-11-09 DIAGNOSIS — J209 Acute bronchitis, unspecified: Secondary | ICD-10-CM | POA: Diagnosis not present

## 2023-11-09 DIAGNOSIS — Z1231 Encounter for screening mammogram for malignant neoplasm of breast: Secondary | ICD-10-CM | POA: Diagnosis not present

## 2023-11-09 DIAGNOSIS — Z1211 Encounter for screening for malignant neoplasm of colon: Secondary | ICD-10-CM | POA: Diagnosis not present

## 2023-11-09 DIAGNOSIS — E782 Mixed hyperlipidemia: Secondary | ICD-10-CM

## 2023-11-09 LAB — COMPREHENSIVE METABOLIC PANEL WITH GFR
ALT: 14 U/L (ref 0–35)
AST: 19 U/L (ref 0–37)
Albumin: 4.3 g/dL (ref 3.5–5.2)
Alkaline Phosphatase: 62 U/L (ref 39–117)
BUN: 16 mg/dL (ref 6–23)
CO2: 29 meq/L (ref 19–32)
Calcium: 9.7 mg/dL (ref 8.4–10.5)
Chloride: 103 meq/L (ref 96–112)
Creatinine, Ser: 0.63 mg/dL (ref 0.40–1.20)
GFR: 96.63 mL/min (ref >60.00)
Glucose, Bld: 88 mg/dL (ref 70–99)
Potassium: 5 meq/L (ref 3.5–5.1)
Sodium: 137 meq/L (ref 135–145)
Total Bilirubin: 0.4 mg/dL (ref 0.2–1.2)
Total Protein: 6.8 g/dL (ref 6.0–8.3)

## 2023-11-09 LAB — LIPID PANEL
Cholesterol: 215 mg/dL — ABNORMAL HIGH (ref 0–200)
HDL: 71.6 mg/dL (ref >39.00)
LDL Cholesterol: 126 mg/dL — ABNORMAL HIGH (ref 0–99)
NonHDL: 143.57
Total CHOL/HDL Ratio: 3
Triglycerides: 88 mg/dL (ref 0.0–149.0)
VLDL: 17.6 mg/dL (ref 0.0–40.0)

## 2023-11-09 LAB — CBC
HCT: 38 % (ref 36.0–46.0)
Hemoglobin: 12.7 g/dL (ref 12.0–15.0)
MCHC: 33.3 g/dL (ref 30.0–36.0)
MCV: 93.3 fl (ref 78.0–100.0)
Platelets: 391 K/uL (ref 150.0–400.0)
RBC: 4.07 Mil/uL (ref 3.87–5.11)
RDW: 13.2 % (ref 11.5–15.5)
WBC: 11 K/uL — ABNORMAL HIGH (ref 4.0–10.5)

## 2023-11-09 LAB — VITAMIN D 25 HYDROXY (VIT D DEFICIENCY, FRACTURES): VITD: 40.9 ng/mL (ref 30.00–100.00)

## 2023-11-09 LAB — HEMOGLOBIN A1C: Hgb A1c MFr Bld: 6.1 % (ref 4.6–6.5)

## 2023-11-09 MED ORDER — CETIRIZINE HCL 10 MG PO TABS: 10.0000 mg | ORAL_TABLET | Freq: Every day | ORAL | 3 refills | Status: AC

## 2023-11-09 MED ORDER — AZITHROMYCIN 250 MG PO TABS: ORAL_TABLET | ORAL | 0 refills | Status: AC

## 2023-11-09 MED ORDER — ALENDRONATE SODIUM 70 MG PO TABS: 70.0000 mg | ORAL_TABLET | ORAL | 11 refills | Status: AC

## 2023-11-09 NOTE — Progress Notes (Signed)
 Patient ID: Michelle Arnold, female  DOB: 08/02/1965, 58 y.o.   MRN: [id number] Patient Care Team    Relationship Specialty Notifications Start End  Catherine Charlies LABOR, DO PCP - General Family Medicine  11/03/15   Vickie Barren Consulting Physician Psychiatry  06/09/23   Florian Suzen Caldron, FNP  Oncology  11/09/23   Lavona Agent, MD Consulting Physician Cardiology  11/09/23   Damien Bast Psychologist    11/09/23   Nora Pew, MD Consulting Physician Gynecology  11/09/23    Comment: Amye Malloy    Chief Complaint  Patient presents with   Annual Exam    routine chronic conditions management.  Pt is fasting.     Subjective: Michelle Arnold is a 58 y.o.  Female  present for CPE and routine chronic conditions management All past medical history, surgical history, allergies, family history, immunizations, medications and social history were updated in the electronic medical record today. All recent labs, ED visits and hospitalizations within the last year were reviewed.  Health maintenance:  Colonoscopy: completed 11/29/2013, by Dr. Debarah, resutls normal . follow up  10 years. Mamm[facility]: [facility] Cervical cancer screening: hysterectomy s/p ovarian cancer Immunizations: tdap UTD 08/2021, Influenza (encouraged yearly), PNA 20 completed for smoking history (repeat after 65).  Shingrix  competed. Pt desires hep a (2 dose- second in 6 mos) and B vaccines (2 dose- second in 30 days) today Infectious disease screening: HIV and Hep C screenings completed DEXA: 2021> osteoporosis (-2.5) > ordered for [facility] Patient has a Dental home. Hospitalizations/ED visits: Reviewed    Mixed hyperlipidemia Patient reports compliance with zeita. Lipids responded well to medication.    osteoporosis without current pathological fracture Compliant with  fosamax  once weekly dose.  Compliant with vitamin D  supplementation  Shared decision making visit for lung cancer screening: Patient was  brought in today for a office visit concerning shared decision making for their lung cancer screening.Michelle Arnold is a 58 y.o. female Patient is between the ages of 35-80: Yes Patient is a current smoker with at least 20 year pack year history or Patient is a former smoker, quit less than 15 years ago and has a 20 pack year history : Yes Patient has current symptoms: No Patient has a  health problem that substantially limits life expectancy or the ability or willingness to have curative lung surgery: No        11/09/2023    9:42 AM 07/28/2023    9:49 AM 01/27/2023    9:10 AM 04/14/2022    9:43 AM 08/04/2021    9:55 AM  Depression screen PHQ 2/9  Decreased Interest 0 0 0 2 3  Down, Depressed, Hopeless 1 0 0 2 3  PHQ - 2 Score 1 0 0 4 6  Altered sleeping 0 3 0 2 2  Tired, decreased energy 0 0 0 2 2  Change in appetite 0 0 0 0 0  Feeling bad or failure about yourself  1 2 0 2 0  Trouble concentrating 0 3 0 3 0  Moving slowly or fidgety/restless 0 3 0 3 0  Suicidal thoughts 0 0 0 0 0  PHQ-9 Score 2 11 0 16 10  Difficult doing work/chores Somewhat difficult Very difficult Not difficult at all        11/09/2023    9:42 AM 07/28/2023    9:50 AM 01/27/2023    9:10 AM 08/04/2021    9:55 AM  GAD 7 : Generalized Anxiety Score  Nervous, Anxious, on Edge 1 3 0 3  Control/stop worrying 1 1 0 2  Worry too much - different things 1 3 0 2  Trouble relaxing 1 1 0 2  Restless 1 1 0 0  Easily annoyed or irritable 1 1 0 1  Afraid - awful might happen 1 1 0 1  Total GAD 7 Score 7 11 0 11  Anxiety Difficulty Somewhat difficult Somewhat difficult Not difficult at all    Immunization History  Administered Date(s) Administered   Influenza Split 02/04/2012   Influenza Whole 01/04/2011   Influenza,inj,Quad PF,6+ Mos 12/29/2015, 02/02/2017, 01/11/2018, 12/28/2018, 12/20/2019, 12/26/2020   PFIZER(Purple Top)SARS-COV-2 Vaccination 11/08/2019, 11/29/2019   PNEUMOCOCCAL CONJUGATE-20 08/04/2021    Pneumococcal Conjugate-13 06/18/2016   Tdap 11/09/2011, 08/04/2021   Zoster Recombinant(Shingrix ) 10/30/2018, 02/01/2019   Past Medical History:  Diagnosis Date   Acute appendicitis 07/22/2022   Allergy    Chicken pox as a child   CTS (carpal tunnel syndrome)    right   Depression with anxiety    Dermatitis 11/09/2011   Right arm   Fatigue 11/09/2011   Grief at loss of child 04/11/2020   Hidradenitis suppurativa 12/16/2011   Hyperlipidemia    Measles as a child   Migraine 11/09/2011   Mumps as a child   Other abnormal Papanicolaou smear of cervix and cervical HPV(795.09) 2004   required LEEP procedure in 04 but no recurrence   Perimenopausal    Polydipsia 01/11/2018   Syncope and collapse 07/28/2022   Tobacco abuse 11/09/2011   Vitamin D  deficiency    Allergies  Allergen Reactions   Abilify  [Aripiprazole ] Other (See Comments)    Suicidal thoughts.    Atorvastatin Other (See Comments)    Memory loss   Statins Other (See Comments)    Memory loss    Past Surgical History:  Procedure Laterality Date   LAPAROSCOPIC APPENDECTOMY N/A 07/23/2022   Procedure: APPENDECTOMY LAPAROSCOPIC;  Surgeon: Curvin Deward MOULD, MD;  Location: [facility];  Service: General;  Laterality: N/A;   LEEP  04/05/2002   RADICAL ABDOMINAL HYSTERECTOMY  09/15/2022   Total hysterectomy/BSO and bilateral pelvic and para-aortic lymph node dissection and peritoneal biopsies.-Left ovarian cancer   Family History  Problem Relation Age of Onset   Diabetes Mother        ?   Osteoporosis Mother    Hyperlipidemia Father    Heart disease Father        MI in 21   Hypertension Father    Heart attack Father 75   Heart attack Brother    Cancer Maternal Grandmother        lung- smoker   Osteoporosis Maternal Grandmother    Cancer Maternal Grandfather        prostate   Cataracts Paternal Grandmother    Other Paternal Grandfather        PAD with gangrene   Breast cancer Maternal Aunt    Social History    Social History Narrative   Married. Has children.    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    Everyday smoker. Occasional alcohol. No drugs.     Allergies as of 11/09/2023       Reactions   Abilify  [aripiprazole ] Other (See Comments)   Suicidal thoughts.    Atorvastatin Other (See Comments)   Memory loss   Statins Other (See Comments)   Memory loss         Medication List        Accurate as of  November 09, 2023  9:57 AM. If you have any questions, ask your nurse or doctor.          alendronate  70 MG tablet Commonly known as: FOSAMAX  Take 1 tablet (70 mg total) by mouth every 7 (seven) days. Take with a full glass of water on an empty stomach.   ascorbic acid 500 MG tablet Commonly known as: VITAMIN C Take by mouth.   azithromycin  250 MG tablet Commonly known as: ZITHROMAX  Take 2 tablets on day 1, then 1 tablet daily on days 2 through 5 Started by: Peityn Payton   B-12 1000 MCG Caps   cetirizine  10 MG tablet Commonly known as: ZYRTEC  Take 1 tablet (10 mg total) by mouth daily. Started by: Charlies Bellini   ezetimibe  10 MG tablet Commonly known as: ZETIA  Take 1 tablet (10 mg total) by mouth daily.   fluticasone  50 MCG/ACT nasal spray Commonly known as: FLONASE  Place 1 spray into both nostrils as needed for allergies or rhinitis.   lithium carbonate 300 MG capsule Take 300 mg by mouth at bedtime.   LORazepam  1 MG tablet Commonly known as: ATIVAN  Take by mouth.   mirtazapine  15 MG tablet Commonly known as: REMERON  Take 15 mg by mouth at bedtime.   Vitamin D -3 125 MCG (5000 UT) Tabs Take 5,000 Units by mouth daily.        All past medical history, surgical history, allergies, family history, immunizations andmedications were updated in the EMR today and reviewed under the history and medication portions of their EMR.       No results found.   ROS 14 pt review of systems performed and negative (unless mentioned in an HPI)  Objective: BP 120/82   Pulse 84    Temp 98.3 F (36.8 C)   Ht 5' 2 (1.575 m)   Wt 122 lb (55.3 kg)   LMP  (LMP Unknown)   SpO2 97%   BMI 22.31 kg/m  Physical Exam Vitals and nursing note reviewed.  Constitutional:      General: She is not in acute distress.    Appearance: Normal appearance. She is not ill-appearing or toxic-appearing.  HENT:     Head: Normocephalic and atraumatic.     Right Ear: Tympanic membrane, ear canal and external ear normal. There is no impacted cerumen.     Left Ear: Tympanic membrane, ear canal and external ear normal. There is no impacted cerumen.     Nose: No congestion or rhinorrhea.     Mouth/Throat:     Mouth: Mucous membranes are moist.     Pharynx: Oropharynx is clear. No oropharyngeal exudate or posterior oropharyngeal erythema.  Eyes:     General: No scleral icterus.       Right eye: No discharge.        Left eye: No discharge.     Extraocular Movements: Extraocular movements intact.     Conjunctiva/sclera: Conjunctivae normal.     Pupils: Pupils are equal, round, and reactive to light.  Cardiovascular:     Rate and Rhythm: Normal rate and regular rhythm.     Pulses: Normal pulses.     Heart sounds: Normal heart sounds. No murmur heard.    No friction rub. No gallop.  Pulmonary:     Effort: Pulmonary effort is normal. No respiratory distress.     Breath sounds: Normal breath sounds. No stridor. No wheezing, rhonchi or rales.  Chest:     Chest wall: No tenderness.  Abdominal:  General: Abdomen is flat. Bowel sounds are normal. There is no distension.     Palpations: Abdomen is soft. There is no mass.     Tenderness: There is no abdominal tenderness. There is no right CVA tenderness, left CVA tenderness, guarding or rebound.     Hernia: No hernia is present.  Musculoskeletal:        General: No swelling, tenderness or deformity. Normal range of motion.     Cervical back: Normal range of motion and neck supple. No rigidity or tenderness.     Right lower leg: No  edema.     Left lower leg: No edema.  Lymphadenopathy:     Cervical: No cervical adenopathy.  Skin:    General: Skin is warm and dry.     Coloration: Skin is not jaundiced or pale.     Findings: No bruising, erythema, lesion or rash.  Neurological:     General: No focal deficit present.     Mental Status: She is alert and oriented to person, place, and time. Mental status is at baseline.     Cranial Nerves: No cranial nerve deficit.     Sensory: No sensory deficit.     Motor: No weakness.     Coordination: Coordination normal.     Gait: Gait normal.     Deep Tendon Reflexes: Reflexes normal.  Psychiatric:        Mood and Affect: Mood normal.        Behavior: Behavior normal.        Thought Content: Thought content normal.        Judgment: Judgment normal.      No results found.  Assessment/plan: CAMDYNN MARANTO is a 58 y.o. female present for CPE and chronic condition management osteoporosis without current pathological fracture Continue fosamax  once weekly.  Repeat DEXA due 08/2021> ordered [facility] Vitamin D  levels collected today   Mixed hyperlipidemia/Allergy to statin medication Stable Continue  zeita  Ovarian cancer on left [facility]) Following with gyn/onc  Colon cancer screening - Ambulatory referral to Gastroenterology  Encounter for screening for lung cancer/Smoking greater than 20 pack years - CT CHEST LUNG CA SCREEN LOW DOSE W/O CM; Future -Patient was counseled on lung cancer screening today.  Patient does meet criteria for lung cancer screening.  Patient would like to proceed with lung cancer screening.  Patient understands screening may warrant further studies or repeat studies if any abnormality is found.  Patient is agreeable. -Patient has had a recent kidney function test:Yes - CT CHEST LUNG CA SCREEN LOW DOSE W/O CM; Future - Follow-up upon screening results.  Acute bronchitis: Z-pack prescribed Continue mucinex  Routine general medical examination at  a health care facility Patient was encouraged to exercise greater than 150 minutes a week. Patient was encouraged to choose a diet filled with fresh fruits and vegetables, and lean meats. AVS provided to patient today for education/recommendation on gender specific health and safety maintenance. Colonoscopy: completed 11/29/2013, by Dr. Debarah, resutls normal . follow up  10 years. Mammogram: [facility] Cervical cancer screening: hysterectomy s/p ovarian cancer Immunizations: tdap UTD 08/2021, Influenza (encouraged yearly), PNA 20 completed for smoking history (repeat after 65).  Shingrix  competed. Pt desires hep a (2 dose- second in 6 mos) and B vaccines (2 dose- second in 30 days) today Infectious disease screening: HIV and Hep C screenings completed DEXA: 2021> osteoporosis (-2.5) > ordered for [facility]  Return in about 1 year (around 11/09/2024) for cpe (20 min), Routine  chronic condition follow-up.   Orders Placed This Encounter  Procedures   [facility]   DG Bone Density   CT CHEST LUNG CA SCREEN LOW DOSE W/O CM   CBC   Comprehensive metabolic panel with GFR   Hemoglobin A1c   Lipid panel   Vitamin D  (25 hydroxy)   Ambulatory referral to Gastroenterology   Meds ordered this encounter  Medications   alendronate  (FOSAMAX ) 70 MG tablet    Sig: Take 1 tablet (70 mg total) by mouth every 7 (seven) days. Take with a full glass of water on an empty stomach.    Dispense:  4 tablet    Refill:  11   azithromycin  (ZITHROMAX ) 250 MG tablet    Sig: Take 2 tablets on day 1, then 1 tablet daily on days 2 through 5    Dispense:  6 tablet    Refill:  0   cetirizine  (ZYRTEC ) 10 MG tablet    Sig: Take 1 tablet (10 mg total) by mouth daily.    Dispense:  90 tablet    Refill:  3   Referral Orders         Ambulatory referral to Gastroenterology       Electronically signed by: Charlies Bellini, DO [facility]

## 2023-11-09 NOTE — Patient Instructions (Addendum)

## 2023-11-14 DIAGNOSIS — M810 Age-related osteoporosis without current pathological fracture: Secondary | ICD-10-CM | POA: Diagnosis not present

## 2023-11-14 DIAGNOSIS — Z13820 Encounter for screening for osteoporosis: Secondary | ICD-10-CM | POA: Diagnosis not present

## 2023-11-14 NOTE — Addendum Note (Signed)
 Addended by: GEORGEAN BEEN A on: 11/14/2023 03:25 PM   Modules accepted: Orders

## 2023-11-15 DIAGNOSIS — R293 Abnormal posture: Secondary | ICD-10-CM | POA: Diagnosis not present

## 2023-11-15 DIAGNOSIS — M546 Pain in thoracic spine: Secondary | ICD-10-CM | POA: Diagnosis not present

## 2023-11-15 DIAGNOSIS — M542 Cervicalgia: Secondary | ICD-10-CM | POA: Diagnosis not present

## 2023-11-17 DIAGNOSIS — M542 Cervicalgia: Secondary | ICD-10-CM | POA: Diagnosis not present

## 2023-11-17 DIAGNOSIS — M546 Pain in thoracic spine: Secondary | ICD-10-CM | POA: Diagnosis not present

## 2023-11-17 DIAGNOSIS — R293 Abnormal posture: Secondary | ICD-10-CM | POA: Diagnosis not present

## 2023-11-18 ENCOUNTER — Telehealth: Payer: Self-pay | Admitting: Family Medicine

## 2023-11-18 NOTE — Telephone Encounter (Unsigned)
 Copied from CRM #[id number]. 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

## 2023-11-21 NOTE — Telephone Encounter (Signed)
 Please advise if there is something we need to do for location change

## 2023-11-21 NOTE — Telephone Encounter (Signed)
 Copied from CRM #[id number]. Topic: Referral - Question >> Nov 18, 2023 11:02 AM Carlyon D wrote: Reason for CRM: pt insurance is stating they will not pay for pt referral they are stating her out of pocket will be higher at the [facility] is not in network with insurance,  referring [facility] pt wants to let Dr. Catherine know. Pt is asking if this ok to still get done at this location? Please reach back out to pt in regards to this,

## 2023-11-23 NOTE — Telephone Encounter (Signed)
 SW received message in Epic from Provider for medication management to close out. SW to close out.

## 2023-11-25 NOTE — Telephone Encounter (Signed)
 noted

## 2023-11-28 DIAGNOSIS — R293 Abnormal posture: Secondary | ICD-10-CM | POA: Diagnosis not present

## 2023-11-28 DIAGNOSIS — M546 Pain in thoracic spine: Secondary | ICD-10-CM | POA: Diagnosis not present

## 2023-11-28 DIAGNOSIS — M542 Cervicalgia: Secondary | ICD-10-CM | POA: Diagnosis not present

## 2023-11-29 NOTE — Telephone Encounter (Signed)
 Copied from CRM #[id number]. Topic: Referral - Question >> Nov 18, 2023 11:02 AM Carlyon D wrote: Reason for CRM: pt insurance is stating they will not pay for pt referral they are stating her out of pocket will be higher at the [facility] is not in network with insurance,  referring [facility] pt wants to let Dr. Catherine know. Pt is asking if this ok to still get done at this location? Please reach back out to pt in regards to this, >> Nov 29, 2023  3:36 PM [place] J wrote: Patient wanted to let Dr Catherine know that [facility] was unable to schedule her for her lung screening since she had a CT Chest Abdomen Pelvis W Contrast completed last December. They said in order to complete her screening with them, Dr Catherine needs to change the order and make sure that's it's being coded non preventative.

## 2023-11-29 NOTE — Telephone Encounter (Signed)
 Pt advised that she will be contacted once provider verify's if there is any change that can be made.

## 2023-11-29 NOTE — Telephone Encounter (Signed)
 LM for pt to return call to discuss.

## 2023-11-29 NOTE — Telephone Encounter (Addendum)
 Imaging order has been placed at [facility]. Will confirm with PCP that CT CHEST LUNG CANCER SCREENING LOW DOSE WO CONTRAST  can not be changed to a diagnostic/non preventative imaging due to not Dx/Hx of lung cancer.  Please advise of any changes are needed

## 2023-11-30 ENCOUNTER — Ambulatory Visit

## 2023-11-30 NOTE — Telephone Encounter (Signed)
 LM for pt to return call to discuss.

## 2023-11-30 NOTE — Telephone Encounter (Signed)
 Spoke with patient regarding results/recommendations.

## 2023-11-30 NOTE — Telephone Encounter (Signed)
 Order for her lung cancer screening can be canceled. Please advise patient that she had a CT chest and abdomen completed in the [facility] in December when she was undergoing cancer treatment through her gynecology team.,  Since she had a CT chest at that time, they are counting that is her lungs being evaluated by CT in December 2024, which was less than a year ago.  Therefore, we will not be able to order her for lung cancer screening until after the new year, for screening purposes.  We did not have record of the CT in December since it was in the [facility].  I did go ahead and print it out so that we can add it to the health maintenance tab to her for her lung cancer screening.  Please abstract this under lung cancer screening and health maintenance tab.

## 2023-12-01 DIAGNOSIS — R293 Abnormal posture: Secondary | ICD-10-CM | POA: Diagnosis not present

## 2023-12-01 DIAGNOSIS — M542 Cervicalgia: Secondary | ICD-10-CM | POA: Diagnosis not present

## 2023-12-01 DIAGNOSIS — M546 Pain in thoracic spine: Secondary | ICD-10-CM | POA: Diagnosis not present

## 2023-12-08 NOTE — Addendum Note (Signed)
 Addended by: CLAUDENE SHANDA ORN on: 12/08/2023 03:53 PM   Modules accepted: Orders

## 2023-12-09 DIAGNOSIS — M546 Pain in thoracic spine: Secondary | ICD-10-CM | POA: Diagnosis not present

## 2023-12-09 DIAGNOSIS — R293 Abnormal posture: Secondary | ICD-10-CM | POA: Diagnosis not present

## 2023-12-09 DIAGNOSIS — M542 Cervicalgia: Secondary | ICD-10-CM | POA: Diagnosis not present

## 2023-12-13 DIAGNOSIS — M542 Cervicalgia: Secondary | ICD-10-CM | POA: Diagnosis not present

## 2023-12-13 DIAGNOSIS — M546 Pain in thoracic spine: Secondary | ICD-10-CM | POA: Diagnosis not present

## 2023-12-13 DIAGNOSIS — R293 Abnormal posture: Secondary | ICD-10-CM | POA: Diagnosis not present

## 2023-12-16 DIAGNOSIS — R293 Abnormal posture: Secondary | ICD-10-CM | POA: Diagnosis not present

## 2023-12-16 DIAGNOSIS — M546 Pain in thoracic spine: Secondary | ICD-10-CM | POA: Diagnosis not present

## 2023-12-16 DIAGNOSIS — M542 Cervicalgia: Secondary | ICD-10-CM | POA: Diagnosis not present

## 2023-12-19 DIAGNOSIS — M546 Pain in thoracic spine: Secondary | ICD-10-CM | POA: Diagnosis not present

## 2023-12-19 DIAGNOSIS — R293 Abnormal posture: Secondary | ICD-10-CM | POA: Diagnosis not present

## 2023-12-19 DIAGNOSIS — M542 Cervicalgia: Secondary | ICD-10-CM | POA: Diagnosis not present

## 2023-12-20 ENCOUNTER — Encounter: Admitting: Family Medicine

## 2023-12-20 DIAGNOSIS — C562 Malignant neoplasm of left ovary: Secondary | ICD-10-CM | POA: Diagnosis not present

## 2023-12-21 DIAGNOSIS — Z79899 Other long term (current) drug therapy: Secondary | ICD-10-CM | POA: Diagnosis not present

## 2023-12-21 DIAGNOSIS — F4323 Adjustment disorder with mixed anxiety and depressed mood: Secondary | ICD-10-CM | POA: Diagnosis not present

## 2023-12-21 DIAGNOSIS — F411 Generalized anxiety disorder: Secondary | ICD-10-CM | POA: Diagnosis not present

## 2023-12-21 DIAGNOSIS — F3176 Bipolar disorder, in full remission, most recent episode depressed: Secondary | ICD-10-CM | POA: Diagnosis not present

## 2023-12-22 DIAGNOSIS — R293 Abnormal posture: Secondary | ICD-10-CM | POA: Diagnosis not present

## 2023-12-22 DIAGNOSIS — M546 Pain in thoracic spine: Secondary | ICD-10-CM | POA: Diagnosis not present

## 2023-12-22 DIAGNOSIS — M542 Cervicalgia: Secondary | ICD-10-CM | POA: Diagnosis not present

## 2023-12-27 ENCOUNTER — Encounter: Payer: Self-pay | Admitting: Internal Medicine

## 2023-12-27 DIAGNOSIS — M546 Pain in thoracic spine: Secondary | ICD-10-CM | POA: Diagnosis not present

## 2023-12-27 DIAGNOSIS — M542 Cervicalgia: Secondary | ICD-10-CM | POA: Diagnosis not present

## 2023-12-27 DIAGNOSIS — R293 Abnormal posture: Secondary | ICD-10-CM | POA: Diagnosis not present

## 2023-12-29 DIAGNOSIS — M546 Pain in thoracic spine: Secondary | ICD-10-CM | POA: Diagnosis not present

## 2023-12-29 DIAGNOSIS — M542 Cervicalgia: Secondary | ICD-10-CM | POA: Diagnosis not present

## 2023-12-29 DIAGNOSIS — R293 Abnormal posture: Secondary | ICD-10-CM | POA: Diagnosis not present

## 2024-01-19 DIAGNOSIS — M546 Pain in thoracic spine: Secondary | ICD-10-CM | POA: Diagnosis not present

## 2024-01-19 DIAGNOSIS — R293 Abnormal posture: Secondary | ICD-10-CM | POA: Diagnosis not present

## 2024-01-19 DIAGNOSIS — M542 Cervicalgia: Secondary | ICD-10-CM | POA: Diagnosis not present

## 2024-01-20 ENCOUNTER — Ambulatory Visit (AMBULATORY_SURGERY_CENTER)

## 2024-01-20 VITALS — Ht 62.0 in | Wt 131.4 lb

## 2024-01-20 DIAGNOSIS — Z1211 Encounter for screening for malignant neoplasm of colon: Secondary | ICD-10-CM

## 2024-01-20 MED ORDER — NA SULFATE-K SULFATE-MG SULF 17.5-3.13-1.6 GM/177ML PO SOLN: 1.0000 | Freq: Once | ORAL | 0 refills | Status: AC

## 2024-01-20 NOTE — Progress Notes (Signed)

## 2024-01-24 ENCOUNTER — Encounter: Payer: Self-pay | Admitting: Internal Medicine

## 2024-01-30 ENCOUNTER — Ambulatory Visit (INDEPENDENT_AMBULATORY_CARE_PROVIDER_SITE_OTHER)

## 2024-01-30 DIAGNOSIS — Z23 Encounter for immunization: Secondary | ICD-10-CM | POA: Diagnosis not present

## 2024-02-01 ENCOUNTER — Encounter: Payer: Self-pay | Admitting: Internal Medicine

## 2024-02-01 ENCOUNTER — Ambulatory Visit: Admitting: Internal Medicine

## 2024-02-01 VITALS — BP 96/55 | HR 71 | Temp 98.3°F | Resp 11 | Ht 62.0 in | Wt 131.4 lb

## 2024-02-01 DIAGNOSIS — D122 Benign neoplasm of ascending colon: Secondary | ICD-10-CM

## 2024-02-01 DIAGNOSIS — K6289 Other specified diseases of anus and rectum: Secondary | ICD-10-CM | POA: Diagnosis not present

## 2024-02-01 DIAGNOSIS — D124 Benign neoplasm of descending colon: Secondary | ICD-10-CM | POA: Diagnosis not present

## 2024-02-01 DIAGNOSIS — D123 Benign neoplasm of transverse colon: Secondary | ICD-10-CM | POA: Diagnosis not present

## 2024-02-01 DIAGNOSIS — Z1211 Encounter for screening for malignant neoplasm of colon: Secondary | ICD-10-CM

## 2024-02-01 DIAGNOSIS — F418 Other specified anxiety disorders: Secondary | ICD-10-CM | POA: Diagnosis not present

## 2024-02-01 MED ORDER — SODIUM CHLORIDE 0.9 % IV SOLN: 500.0000 mL | Freq: Once | INTRAVENOUS | Status: DC

## 2024-02-01 NOTE — Progress Notes (Signed)
 Called to room to assist during endoscopic procedure.  Patient ID and intended procedure confirmed with present staff. Received instructions for my participation in the procedure from the performing physician.

## 2024-02-01 NOTE — Progress Notes (Signed)
 Pt's states no medical or surgical changes since previsit or office visit.

## 2024-02-01 NOTE — Progress Notes (Signed)
 Report to PACU, RN, vss, BBS= Clear.

## 2024-02-01 NOTE — Patient Instructions (Signed)
 Resume previous diet. Continue present medications. Awaiting pathology results.  Repeat colonoscopy is recommended for surveillance. The date will be determined after pathology results from today's exam. Handout provided on polyps.   YOU HAD AN ENDOSCOPIC PROCEDURE TODAY AT THE [facility]:   Refer to the procedure report that was given to you for any specific questions about what was found during the examination.  If the procedure report does not answer your questions, please call your gastroenterologist to clarify.  If you requested that your care partner not be given the details of your procedure findings, then the procedure report has been included in a sealed envelope for you to review at your convenience later.  YOU SHOULD EXPECT: Some feelings of bloating in the abdomen. Passage of more gas than usual.  Walking can help get rid of the air that was put into your GI tract during the procedure and reduce the bloating. If you had a lower endoscopy (such as a colonoscopy or flexible sigmoidoscopy) you may notice spotting of blood in your stool or on the toilet paper. If you underwent a bowel prep for your procedure, you may not have a normal bowel movement for a few days.  Please Note:  You might notice some irritation and congestion in your nose or some drainage.  This is from the oxygen used during your procedure.  There is no need for concern and it should clear up in a day or so.  SYMPTOMS TO REPORT IMMEDIATELY:  Following lower endoscopy (colonoscopy or flexible sigmoidoscopy):  Excessive amounts of blood in the stool  Significant tenderness or worsening of abdominal pains  Swelling of the abdomen that is new, acute  Fever of 100F or higher   For urgent or emergent issues, a gastroenterologist can be reached at any hour by calling [phone]. Do not use MyChart messaging for urgent concerns.    DIET:  We do recommend a small meal at first, but then you may proceed to  your regular diet.  Drink plenty of fluids but you should avoid alcoholic beverages for 24 hours.  ACTIVITY:  You should plan to take it easy for the rest of today and you should NOT DRIVE or use heavy machinery until tomorrow (because of the sedation medicines used during the test).    FOLLOW UP: Our staff will call the number listed on your records the next business day following your procedure.  We will call around 7:15- 8:00 am to check on you and address any questions or concerns that you may have regarding the information given to you following your procedure. If we do not reach you, we will leave a message.     If any biopsies were taken you will be contacted by phone or by letter within the next 1-3 weeks.  Please call us  at [phone] if you have not heard about the biopsies in 3 weeks.    SIGNATURES/CONFIDENTIALITY: You and/or your care partner have signed paperwork which will be entered into your electronic medical record.  These signatures attest to the fact that that the information above on your After Visit Summary has been reviewed and is understood.  Full responsibility of the confidentiality of this discharge information lies with you and/or your care-partner.

## 2024-02-01 NOTE — Progress Notes (Signed)
 GASTROENTEROLOGY PROCEDURE H&P NOTE   Primary Care Physician: Catherine Charlies LABOR, DO    Reason for Procedure:  Colon cancer screening  Plan:    Colonoscopy  Patient is appropriate for endoscopic procedure(s) in the ambulatory ([facility]) setting.  The nature of the procedure, as well as the risks, benefits, and alternatives were carefully and thoroughly reviewed with the patient. Ample time for discussion and questions allowed. The patient understood, was satisfied, and agreed to proceed.     HPI: Michelle Arnold is a 58 y.o. female who presents for colonoscopy.  Medical history as below.  Tolerated the prep.  No recent chest pain or shortness of breath.  No abdominal pain today.  Past Medical History:  Diagnosis Date   Acute appendicitis 07/22/2022   Allergy    Cancer (HCC)    Ovarian Cancer- Finished Chemo in  03/2023   Chicken pox as a child   CTS (carpal tunnel syndrome)    right   Depression with anxiety    Dermatitis 11/09/2011   Right arm   Fatigue 11/09/2011   Grief at loss of child 04/11/2020   Hidradenitis suppurativa 12/16/2011   Hyperlipidemia    Measles as a child   Migraine 11/09/2011   Mumps as a child   Other abnormal Papanicolaou smear of cervix and cervical HPV(795.09) 2004   required LEEP procedure in 04 but no recurrence   Perimenopausal    Polydipsia 01/11/2018   Syncope and collapse 07/28/2022   Tobacco abuse 11/09/2011   Vitamin D  deficiency     Past Surgical History:  Procedure Laterality Date   LAPAROSCOPIC APPENDECTOMY N/A 07/23/2022   Procedure: APPENDECTOMY LAPAROSCOPIC;  Surgeon: Curvin Deward MOULD, MD;  Location: [facility];  Service: General;  Laterality: N/A;   LEEP  04/05/2002   RADICAL ABDOMINAL HYSTERECTOMY  09/15/2022   Total hysterectomy/BSO and bilateral pelvic and para-aortic lymph node dissection and peritoneal biopsies.-Left ovarian cancer    Prior to Admission medications   Medication Sig Start Date End Date Taking? Authorizing  Provider  cetirizine  (ZYRTEC ) 10 MG tablet Take 1 tablet (10 mg total) by mouth daily. 11/09/23  Yes Kuneff, Renee A, DO  Cholecalciferol (VITAMIN D -3) 125 MCG (5000 UT) TABS Take 5,000 Units by mouth daily.   Yes [provider]  Cyanocobalamin (B-12) 1000 MCG CAPS  06/27/23  Yes [provider]  ezetimibe  (ZETIA ) 10 MG tablet Take 1 tablet (10 mg total) by mouth daily. 09/29/23  Yes Kuneff, Renee A, DO  fluticasone  (FLONASE ) 50 MCG/ACT nasal spray Place 1 spray into both nostrils as needed for allergies or rhinitis.   Yes [provider]  lithium  carbonate 300 MG capsule Take 300 mg by mouth at bedtime.   Yes [provider]  LORazepam  (ATIVAN ) 1 MG tablet Take by mouth. 07/22/23  Yes [provider]  alendronate  (FOSAMAX ) 70 MG tablet Take 1 tablet (70 mg total) by mouth every 7 (seven) days. Take with a full glass of water on an empty stomach. 11/09/23   Kuneff, Renee A, DO  ascorbic acid (VITAMIN C) 500 MG tablet Take by mouth.    [provider]    Current Outpatient Medications  Medication Sig Dispense Refill   cetirizine  (ZYRTEC ) 10 MG tablet Take 1 tablet (10 mg total) by mouth daily. 90 tablet 3   Cholecalciferol (VITAMIN D -3) 125 MCG (5000 UT) TABS Take 5,000 Units by mouth daily.     Cyanocobalamin (B-12) 1000 MCG CAPS  ezetimibe  (ZETIA ) 10 MG tablet Take 1 tablet (10 mg total) by mouth daily. 90 tablet 3   fluticasone  (FLONASE ) 50 MCG/ACT nasal spray Place 1 spray into both nostrils as needed for allergies or rhinitis.     lithium  carbonate 300 MG capsule Take 300 mg by mouth at bedtime.     LORazepam  (ATIVAN ) 1 MG tablet Take by mouth.     alendronate  (FOSAMAX ) 70 MG tablet Take 1 tablet (70 mg total) by mouth every 7 (seven) days. Take with a full glass of water on an empty stomach. 4 tablet 11   ascorbic acid (VITAMIN C) 500 MG tablet Take by mouth.     Current Facility-Administered Medications  Medication Dose Route  Frequency Provider Last Rate Last Admin   0.9 %  sodium chloride  infusion  500 mL Intravenous Once Roshelle Traub, Gordy HERO, MD        Allergies as of 02/01/2024 - Review Complete 02/01/2024  Allergen Reaction Noted   Abilify  [aripiprazole ] Other (See Comments) 06/02/2020   Atorvastatin Other (See Comments) 12/30/2010   Statins Other (See Comments) 07/20/2018    Family History  Problem Relation Age of Onset   Diabetes Mother        ?   Osteoporosis Mother    Hyperlipidemia Father    Heart disease Father        MI in 55   Hypertension Father    Heart attack Father 34   Heart attack Brother    Breast cancer Maternal Aunt    Cancer Maternal Grandmother        lung- smoker   Osteoporosis Maternal Grandmother    Cancer Maternal Grandfather        prostate   Cataracts Paternal Grandmother    Other Paternal Grandfather        PAD with gangrene   Colon cancer Neg Hx    Esophageal cancer Neg Hx    Rectal cancer Neg Hx    Stomach cancer Neg Hx     Social History   Socioeconomic History   Marital status: Married    Spouse name: Not on file   Number of children: Not on file   Years of education: Not on file   Highest education level: Professional school degree (e.g., MD, DDS, DVM, JD)  Occupational History   Occupation: self employed   Tobacco Use   Smoking status: Every Day    Current packs/day: 1.00    Average packs/day: 1 pack/day for 30.0 years (30.0 ttl pk-yrs)    Types: Cigarettes    Passive exposure: Never   Smokeless tobacco: Never  Vaping Use   Vaping status: Never Used  Substance and Sexual Activity   Alcohol use: Yes    Comment: very rare   Drug use: No   Sexual activity: Yes    Partners: Male    Comment: Married  Other Topics Concern   Not on file  Social History Narrative   Married. Has children.    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    Everyday smoker. Occasional alcohol. No drugs.    Social Drivers of Health   Financial Resource Strain: High Risk (09/28/2023)    Overall Financial Resource Strain (CARDIA)    Difficulty of Paying Living Expenses: Very hard  Food Insecurity: Food Insecurity Present (09/28/2023)   Hunger Vital Sign    Worried About Running Out of Food in the Last Year: Sometimes true    Ran Out of Food in the Last Year: Sometimes true  Transportation Needs: No Transportation Needs (09/28/2023)   PRAPARE - Administrator, Civil Service (Medical): No    Lack of Transportation (Non-Medical): No  Physical Activity: Sufficiently Active (09/28/2023)   Exercise Vital Sign    Days of Exercise per Week: 5 days    Minutes of Exercise per Session: 30 min  Stress: Stress Concern Present (09/28/2023)   [employer] of Occupational Health - Occupational Stress Questionnaire    Feeling of Stress: Very much  Social Connections: Socially Integrated (09/28/2023)   Social Connection and Isolation Panel    Frequency of Communication with Friends and Family: More than three times a week    Frequency of Social Gatherings with Friends and Family: More than three times a week    Attends Religious Services: More than 4 times per year    Active Member of [employer] or Organizations: Yes    Attends Engineer, Structural: More than 4 times per year    Marital Status: Married  Catering Manager Violence: Not At Risk (07/24/2022)   Humiliation, Afraid, Rape, and Kick questionnaire    Fear of Current or Ex-Partner: No    Emotionally Abused: No    Physically Abused: No    Sexually Abused: No    Physical Exam: Vital signs in last 24 hours: @BP  (!) 105/57   Pulse 74   Temp 98.3 F (36.8 C)   Ht 5' 2 (1.575 m)   Wt 131 lb 6.4 oz (59.6 kg)   LMP  (LMP Unknown)   SpO2 98%   BMI 24.03 kg/m  GEN: NAD EYE: Sclerae anicteric ENT: MMM CV: Non-tachycardic Pulm: CTA b/l GI: Soft, NT/ND NEURO:  Alert & Oriented x 3   Gordy Starch, MD [facility]  02/01/2024 9:01 AM

## 2024-02-01 NOTE — Op Note (Signed)
 [facility] Patient Name: Michelle Arnold Procedure Date: 02/01/2024 9:01 AM MRN: [id number] Endoscopist: Gordy [name] , MD, [id number] Age: 58 Referring MD:  Date of Birth: [birth year] Gender: Female Account #: [id number] Procedure:                Colonoscopy Indications:              Screening for colorectal malignant neoplasm Medicines:                Monitored Anesthesia Care Procedure:                Pre-Anesthesia Assessment:                           - Prior to the procedure, a History and Physical                            was performed, and patient medications and                            allergies were reviewed. The patient's tolerance of                            previous anesthesia was also reviewed. The risks                            and benefits of the procedure and the sedation                            options and risks were discussed with the patient.                            All questions were answered, and informed consent                            was obtained. Prior Anticoagulants: The patient has                            taken no anticoagulant or antiplatelet agents. ASA                            Grade Assessment: II - A patient with mild systemic                            disease. After reviewing the risks and benefits,                            the patient was deemed in satisfactory condition to                            undergo the procedure.                           After obtaining informed consent, the colonoscope  was passed under direct vision. Throughout the                            procedure, the patient's blood pressure, pulse, and                            oxygen saturations were monitored continuously. The                            Olympus Scope SN [id number] was introduced through the                            anus and advanced to the cecum, identified by                            appendiceal orifice  and ileocecal valve. The                            colonoscopy was performed without difficulty. The                            patient tolerated the procedure well. The quality                            of the bowel preparation was good. The ileocecal                            valve, appendiceal orifice, and rectum were                            photographed. Scope In: 9:09:46 AM Scope Out: 9:39:43 AM Scope Withdrawal Time: 0 hours 22 minutes 19 seconds  Total Procedure Duration: 0 hours 29 minutes 57 seconds  Findings:                 The digital rectal exam was normal.                           A 15 mm polyp was found in the proximal ascending                            colon. The polyp was sessile. The polyp was removed                            with a cold snare. Resection and retrieval were                            complete.                           A 4 mm polyp was found in the ascending colon. The                            polyp was flat. The polyp was removed with a  cold                            snare. Resection and retrieval were complete.                           Two sessile polyps were found in the transverse                            colon. The polyps were 5 to 6 mm in size. These                            polyps were removed with a cold snare. Resection                            and retrieval were complete.                           A 5 mm polyp was found in the descending colon. The                            polyp was sessile. The polyp was removed with a                            cold snare. Resection and retrieval were complete.                           Anal papillae were hypertrophied on retroflexion                            views of the rectum. Retroflexion otherwise normal. Complications:            No immediate complications. Estimated Blood Loss:     Estimated blood loss was minimal. Impression:               - One 15 mm polyp in the proximal  ascending colon,                            removed with a cold snare. Resected and retrieved.                           - One 4 mm polyp in the ascending colon, removed                            with a cold snare. Resected and retrieved.                           - Two 5 to 6 mm polyps in the transverse colon,                            removed with a cold snare. Resected and retrieved.                           -  One 5 mm polyp in the descending colon, removed                            with a cold snare. Resected and retrieved.                           - Anal papillae were hypertrophied. Recommendation:           - Patient has a contact number available for                            emergencies. The signs and symptoms of potential                            delayed complications were discussed with the                            patient. Return to normal activities tomorrow.                            Written discharge instructions were provided to the                            patient.                           - Resume previous diet.                           - Continue present medications.                           - Await pathology results.                           - Repeat colonoscopy is recommended for                            surveillance. The colonoscopy date will be                            determined after pathology results from today's                            exam become available for review. Gordy [name], MD 02/01/2024 9:43:02 AM This report has been signed electronically.

## 2024-02-02 ENCOUNTER — Telehealth: Payer: Self-pay | Admitting: *Deleted

## 2024-02-02 NOTE — Telephone Encounter (Signed)
 No answer on follow up call. Left message.

## 2024-02-03 LAB — SURGICAL PATHOLOGY

## 2024-02-06 ENCOUNTER — Ambulatory Visit: Payer: Self-pay | Admitting: Internal Medicine

## 2024-03-14 DIAGNOSIS — Z79899 Other long term (current) drug therapy: Secondary | ICD-10-CM | POA: Diagnosis not present

## 2024-03-14 DIAGNOSIS — F411 Generalized anxiety disorder: Secondary | ICD-10-CM | POA: Diagnosis not present

## 2024-03-14 DIAGNOSIS — F5105 Insomnia due to other mental disorder: Secondary | ICD-10-CM | POA: Diagnosis not present

## 2024-03-14 DIAGNOSIS — F4323 Adjustment disorder with mixed anxiety and depressed mood: Secondary | ICD-10-CM | POA: Diagnosis not present

## 2024-03-14 DIAGNOSIS — F3176 Bipolar disorder, in full remission, most recent episode depressed: Secondary | ICD-10-CM | POA: Diagnosis not present

## 2024-03-20 DIAGNOSIS — C562 Malignant neoplasm of left ovary: Secondary | ICD-10-CM | POA: Diagnosis not present

## 2024-03-20 DIAGNOSIS — L739 Follicular disorder, unspecified: Secondary | ICD-10-CM | POA: Diagnosis not present

## 2024-03-20 DIAGNOSIS — R5383 Other fatigue: Secondary | ICD-10-CM | POA: Diagnosis not present

## 2024-11-09 ENCOUNTER — Encounter: Admitting: Family Medicine
